# Patient Record
Sex: Female | Born: 1945 | Race: White | Hispanic: Yes | Smoking: Never smoker
Health system: Southern US, Community
[De-identification: ages and names within clinical notes are randomized; demographics above are authoritative.]

## PROBLEM LIST (undated history)

## (undated) DIAGNOSIS — T7840XA Allergy, unspecified, initial encounter: Secondary | ICD-10-CM

## (undated) DIAGNOSIS — I10 Essential (primary) hypertension: Secondary | ICD-10-CM

## (undated) DIAGNOSIS — E785 Hyperlipidemia, unspecified: Secondary | ICD-10-CM

## (undated) DIAGNOSIS — M1990 Unspecified osteoarthritis, unspecified site: Secondary | ICD-10-CM

## (undated) DIAGNOSIS — H269 Unspecified cataract: Secondary | ICD-10-CM

## (undated) DIAGNOSIS — Z5189 Encounter for other specified aftercare: Secondary | ICD-10-CM

## (undated) DIAGNOSIS — M810 Age-related osteoporosis without current pathological fracture: Secondary | ICD-10-CM

## (undated) DIAGNOSIS — G43909 Migraine, unspecified, not intractable, without status migrainosus: Secondary | ICD-10-CM

## (undated) DIAGNOSIS — G4730 Sleep apnea, unspecified: Secondary | ICD-10-CM

## (undated) DIAGNOSIS — J45909 Unspecified asthma, uncomplicated: Secondary | ICD-10-CM

## (undated) HISTORY — DX: Unspecified cataract: H26.9

## (undated) HISTORY — DX: Hyperlipidemia, unspecified: E78.5

## (undated) HISTORY — DX: Encounter for other specified aftercare: Z51.89

## (undated) HISTORY — PX: EYE SURGERY: SHX253

## (undated) HISTORY — PX: FOOT SURGERY: SHX648

## (undated) HISTORY — DX: Migraine, unspecified, not intractable, without status migrainosus: G43.909

## (undated) HISTORY — DX: Age-related osteoporosis without current pathological fracture: M81.0

## (undated) HISTORY — DX: Essential (primary) hypertension: I10

## (undated) HISTORY — PX: ABDOMINAL HYSTERECTOMY: SHX81

## (undated) HISTORY — DX: Unspecified osteoarthritis, unspecified site: M19.90

## (undated) HISTORY — DX: Sleep apnea, unspecified: G47.30

## (undated) HISTORY — DX: Allergy, unspecified, initial encounter: T78.40XA

## (undated) HISTORY — DX: Unspecified asthma, uncomplicated: J45.909

## (undated) HISTORY — PX: CHOLECYSTECTOMY: SHX55

---

## 2000-12-31 ENCOUNTER — Ambulatory Visit (HOSPITAL_COMMUNITY): Admission: RE | Admit: 2000-12-31 | Discharge: 2000-12-31 | Payer: Self-pay | Admitting: Family Medicine

## 2001-01-06 ENCOUNTER — Encounter: Payer: Self-pay | Admitting: Family Medicine

## 2001-01-06 ENCOUNTER — Encounter: Admission: RE | Admit: 2001-01-06 | Discharge: 2001-01-06 | Payer: Self-pay | Admitting: Family Medicine

## 2003-02-08 ENCOUNTER — Ambulatory Visit (HOSPITAL_COMMUNITY): Admission: RE | Admit: 2003-02-08 | Discharge: 2003-02-08 | Payer: Self-pay | Admitting: Family Medicine

## 2004-10-25 ENCOUNTER — Emergency Department (HOSPITAL_COMMUNITY): Admission: EM | Admit: 2004-10-25 | Discharge: 2004-10-25 | Payer: Self-pay | Admitting: Emergency Medicine

## 2008-03-15 ENCOUNTER — Encounter: Payer: Self-pay | Admitting: Internal Medicine

## 2008-08-28 ENCOUNTER — Encounter: Payer: Self-pay | Admitting: Internal Medicine

## 2008-08-29 ENCOUNTER — Encounter: Payer: Self-pay | Admitting: Internal Medicine

## 2008-09-18 ENCOUNTER — Ambulatory Visit: Payer: Self-pay | Admitting: Internal Medicine

## 2008-09-18 DIAGNOSIS — I10 Essential (primary) hypertension: Secondary | ICD-10-CM

## 2008-09-18 DIAGNOSIS — J4521 Mild intermittent asthma with (acute) exacerbation: Secondary | ICD-10-CM

## 2008-09-18 DIAGNOSIS — J309 Allergic rhinitis, unspecified: Secondary | ICD-10-CM | POA: Insufficient documentation

## 2008-09-18 DIAGNOSIS — R05 Cough: Secondary | ICD-10-CM

## 2008-09-18 DIAGNOSIS — R051 Acute cough: Secondary | ICD-10-CM | POA: Insufficient documentation

## 2008-09-19 ENCOUNTER — Telehealth (INDEPENDENT_AMBULATORY_CARE_PROVIDER_SITE_OTHER): Payer: Self-pay | Admitting: *Deleted

## 2008-11-16 ENCOUNTER — Telehealth: Payer: Self-pay | Admitting: Internal Medicine

## 2016-01-14 ENCOUNTER — Encounter: Payer: Self-pay | Admitting: Emergency Medicine

## 2016-03-29 ENCOUNTER — Ambulatory Visit (INDEPENDENT_AMBULATORY_CARE_PROVIDER_SITE_OTHER): Payer: Medicare (Managed Care)

## 2016-03-29 ENCOUNTER — Ambulatory Visit (INDEPENDENT_AMBULATORY_CARE_PROVIDER_SITE_OTHER): Payer: Medicare Other | Admitting: Urgent Care

## 2016-03-29 VITALS — BP 134/72 | HR 89 | Temp 98.5°F | Resp 18 | Ht 61.0 in | Wt 161.4 lb

## 2016-03-29 DIAGNOSIS — R05 Cough: Secondary | ICD-10-CM

## 2016-03-29 DIAGNOSIS — R0989 Other specified symptoms and signs involving the circulatory and respiratory systems: Secondary | ICD-10-CM

## 2016-03-29 DIAGNOSIS — R059 Cough, unspecified: Secondary | ICD-10-CM

## 2016-03-29 DIAGNOSIS — R0789 Other chest pain: Secondary | ICD-10-CM

## 2016-03-29 DIAGNOSIS — J4540 Moderate persistent asthma, uncomplicated: Secondary | ICD-10-CM | POA: Diagnosis not present

## 2016-03-29 MED ORDER — AZITHROMYCIN 250 MG PO TABS: ORAL_TABLET | ORAL | 0 refills | Status: DC

## 2016-03-29 MED ORDER — BENZONATATE 100 MG PO CAPS: 100.0000 mg | ORAL_CAPSULE | Freq: Three times a day (TID) | ORAL | 0 refills | Status: DC | PRN

## 2016-03-29 MED ORDER — HYDROCODONE-HOMATROPINE 5-1.5 MG/5ML PO SYRP: 5.0000 mL | ORAL_SOLUTION | Freq: Every evening | ORAL | 0 refills | Status: DC | PRN

## 2016-03-29 MED ORDER — PREDNISONE 20 MG PO TABS: 40.0000 mg | ORAL_TABLET | Freq: Every day | ORAL | 0 refills | Status: DC

## 2016-03-29 NOTE — Progress Notes (Addendum)
MRN: [id number] DOB: March 05, 1945  Subjective:   Dana Daniels is a 71 y.o. female presenting for chief complaint of Cough (X 3-4 days); Headache (X 3-4 days); and Fatigue (X 3-4 days)  Reports 4 day history of mildly productive cough, chest congestion. Cough elicits chest pain, has shob, wheezing. Has a history of asthma, used atrovent nebulizer this morning with good relief of her wheezing. Takes Singulair, Flovent, Spiriva daily for management of her asthma. Patient is on a beta blocker and her pulmonologist does not want her to use albuterol inhaler. Has also had headaches, history of migraines managed with Fioricet. Associated symptoms include nasal congestion, sore throat (now improved). Denies fever, sinus pain, eye pain, eye redness, ear pain, n/v, abdominal pain, rashes. Received her flu shot this past season. Has had her pneumonia vaccine updated this past season. Has a history of OSA, uses CPAP consistently. Denies smoking cigarettes or alcohol use.   Dana Daniels has a current medication list which includes the following prescription(s): amlodipine, butalbital-acetaminophen-caffeine, vitamin d3, diltiazem, fluticasone, fluticasone, furosemide, ipratropium, ipratropium, montelukast, nitroglycerin, omeprazole, simvastatin, and tiotropium. Also is allergic to tramadol.  Dana Daniels  has a past medical history of Allergy; Arthritis; Asthma; Blood transfusion without reported diagnosis; Cataract; Hypertension; and Osteoporosis. Also  has a past surgical history that includes Abdominal hysterectomy and Cholecystectomy.  Her family history includes Heart disease in her father and mother; Hyperlipidemia in her mother; Hypertension in her mother; Stroke in her father and mother.  Objective:   Vitals: BP 134/72   Pulse 89   Temp 98.5 F (36.9 C) (Oral)   Resp 18   Ht 5\' 1"  (1.549 m)   Wt 161 lb 6.4 oz (73.2 kg)   SpO2 98%   BMI 30.50 kg/m   Physical Exam  Constitutional: She is  oriented to person, place, and time. She appears well-developed and well-nourished.  HENT:  TM's intact bilaterally, no effusions or erythema. Nasal turbinates pink and moist, nasal passages patent. No sinus tenderness. Oropharynx clear, mucous membranes moist, dentition in good repair.  Eyes: Right eye exhibits no discharge. Left eye exhibits no discharge.  Neck: Normal range of motion. Neck supple.  Cardiovascular: Normal rate, regular rhythm and intact distal pulses.  Exam reveals no gallop and no friction rub.   No murmur heard. Pulmonary/Chest: No respiratory distress. She has no wheezes. She has no rales.  Coarse bibasilar lung sounds.  Lymphadenopathy:    She has no cervical adenopathy.  Neurological: She is alert and oriented to person, place, and time.  Skin: Skin is warm and dry.   Dg Chest 2 View  Result Date: 03/29/2016 CLINICAL DATA:  Cough for 3 days EXAM: CHEST  2 VIEW COMPARISON:  None. FINDINGS: Left base atelectasis. Right lung is clear. Heart is normal size. No effusions or acute bony abnormality. IMPRESSION: Left base atelectasis. Electronically Signed   By: Rolm Baptise M.D.   On: 03/29/2016 11:06   Assessment and Plan :   This case was precepted with Dr. Tamala Julian.   1. Cough 2. Chest congestion 3. Atypical chest pain 4. Moderate persistent extrinsic asthma without complication - Will start short steroid course given patient's strong history of asthma, x-ray findings. Patient would like to try this and fill antibiotic if she does not feel better by end of Sunday. In the meantime, she will also use cough suppression medications and schedule her nebulizer treatments. Patient is to f/u Thursday, 04/03/2016 if her cough persists.  Dana Eagles, PA-C Primary Care at [facility] [phone] 03/29/2016  10:31 AM

## 2016-03-29 NOTE — Patient Instructions (Addendum)
Si no ha mejorado con el steroide despues del domingo, tome el antibiotico (azithromycin).     Tos en los adultos (Cough, Adult) La tos es un reflejo que limpia la garganta y las vas respiratorias, y ayuda a la curacin y Health visitor de los pulmones. Es normal toser de Engineer, civil (consulting), pero cuando esta se presenta con otros sntomas o dura mucho tiempo puede ser el signo de una enfermedad que [place]. La tos puede durar solo 2 o 3semanas (aguda) o ms de 8semanas (crnica). CAUSAS Comnmente, las causas de la tos son las siguientes:  Advice worker sustancias que [employer]  Una infeccin respiratoria viral o bacteriana.  Alergias.  Asma.  Goteo posnasal.  Fumar.  El retroceso de cido estomacal hacia el esfago (reflujo gastroesofgico).  Algunos medicamentos.  Los problemas pulmonares crnicos, entre ellos, la enfermedad pulmonar obstructiva crnica (EPOC) (o, en contadas ocasiones, el cncer de pulmn).  Otras afecciones, como la insuficiencia cardaca. INSTRUCCIONES PARA EL CUIDADO EN EL HOGAR Est atento a cualquier cambio en los sntomas. Tome estas medidas para Public house manager las molestias:  Tome los medicamentos solamente como se lo haya indicado el mdico.  Si le recetaron un antibitico, tmelo como se lo haya indicado el mdico. No deje de tomar los antibiticos aunque comience a sentirse mejor.  Hable con el mdico antes de tomar un antitusivo.  Beba suficiente lquido para Consulting civil engineer orina clara o de color amarillo plido.  Si el aire est seco, use un vaporizador o un humidificador con vapor fro en su habitacin o en su casa para ayudar a aflojar las secreciones.  Evite todas las cosas que le producen tos en el trabajo o en su casa.  Si la tos aumenta durante la noche, intente dormir semisentado.  Evite el humo del cigarrillo. Si fuma, deje de hacerlo. Si necesita ayuda para dejar de fumar, consulte al mdico.  Evite la cafena.  Evite el  alcohol.  Descanse todo lo que sea necesario. SOLICITE ATENCIN MDICA SI:  Aparecen nuevos sntomas.  Expectora pus al toser.  La tos no mejora despus de 2 o 3semanas, o empeora.  No puede controlar la tos con antitusivos y no puede dormir bien.  Tiene un dolor que se intensifica o que no puede Research scientist (life sciences).  Tiene fiebre.  Baja de peso sin causa aparente.  Tiene transpiracin nocturna. SOLICITE ATENCIN MDICA DE INMEDIATO SI:  Tose y escupe sangre.  Tiene dificultad para respirar.  Los latidos cardacos son muy rpidos. Esta informacin no tiene Marine scientist el consejo del mdico. Asegrese de hacerle al mdico cualquier pregunta que tenga. Document Released: 09/25/2010 Document Revised: 11/08/2014 Document Reviewed: 04/26/2014 Elsevier Interactive Patient Education  2017 [employer].    IF you received an x-ray today, you will receive an invoice from [facility] Radiology. Please contact [facility] Radiology at [phone] with questions or concerns regarding your invoice.   IF you received labwork today, you will receive an invoice from [place]. Please contact [facility] at [phone] with questions or concerns regarding your invoice.   Our billing staff will not be able to assist you with questions regarding bills from these companies.  You will be contacted with the lab results as soon as they are available. The fastest way to get your results is to activate your My Chart account. Instructions are located on the last page of this paperwork. If you have not heard from [place] regarding the results in 2 weeks, please contact this office.

## 2016-03-30 LAB — CBC
HEMATOCRIT: 44.2 % (ref 34.0–46.6)
Hemoglobin: 14.4 g/dL (ref 11.1–15.9)
MCH: 27.2 pg (ref 26.6–33.0)
MCHC: 32.6 g/dL (ref 31.5–35.7)
MCV: 83 fL (ref 79–97)
Platelets: 358 10*3/uL (ref 150–379)
RBC: 5.3 x10E6/uL — ABNORMAL HIGH (ref 3.77–5.28)
RDW: 14.1 % (ref 12.3–15.4)
WBC: 10.4 10*3/uL (ref 3.4–10.8)

## 2016-03-31 ENCOUNTER — Encounter: Payer: Self-pay | Admitting: Urgent Care

## 2016-04-16 ENCOUNTER — Ambulatory Visit (INDEPENDENT_AMBULATORY_CARE_PROVIDER_SITE_OTHER): Payer: Medicare (Managed Care) | Admitting: Urgent Care

## 2016-04-16 VITALS — BP 124/62 | HR 90 | Temp 98.2°F | Resp 16 | Ht 61.0 in | Wt 162.0 lb

## 2016-04-16 DIAGNOSIS — J4540 Moderate persistent asthma, uncomplicated: Secondary | ICD-10-CM

## 2016-04-16 DIAGNOSIS — R059 Cough, unspecified: Secondary | ICD-10-CM

## 2016-04-16 DIAGNOSIS — R05 Cough: Secondary | ICD-10-CM

## 2016-04-16 DIAGNOSIS — Z9109 Other allergy status, other than to drugs and biological substances: Secondary | ICD-10-CM | POA: Diagnosis not present

## 2016-04-16 MED ORDER — FLUTICASONE PROPIONATE 50 MCG/ACT NA SUSP: 2.0000 | Freq: Every day | NASAL | 11 refills | Status: DC

## 2016-04-16 MED ORDER — PSEUDOEPHEDRINE HCL 60 MG PO TABS: 60.0000 mg | ORAL_TABLET | Freq: Three times a day (TID) | ORAL | 0 refills | Status: DC | PRN

## 2016-04-16 MED ORDER — CETIRIZINE HCL 10 MG PO TABS: 10.0000 mg | ORAL_TABLET | Freq: Every day | ORAL | 11 refills | Status: DC

## 2016-04-16 NOTE — Patient Instructions (Addendum)
Allergies, Adult An allergy is when your body's defense system (immune system) overreacts to an otherwise harmless substance (allergen) that you breathe in or eat or something that touches your skin. When you come into contact with something that you are allergic to, your immune system produces certain proteins (antibodies). These proteins cause cells to release chemicals (histamines) that trigger the symptoms of an allergic reaction. Allergies often affect the nasal passages (allergic rhinitis), eyes (allergic conjunctivitis), skin (atopic dermatitis), and stomach. Allergies can be mild or severe. Allergies cannot spread from person to person (are not contagious). They can develop at any age and may be outgrown. What are the causes? Allergies can be caused by any substance that your immune system mistakenly targets as harmful. These may include:  Outdoor allergens, such as pollen, grass, weeds, car exhaust, and mold spores.  Indoor allergens, such as dust, smoke, mold, and pet dander.  Foods, especially peanuts, milk, eggs, fish, shellfish, soy, nuts, and wheat.  Medicines, such as penicillin.  Skin irritants, such as detergents, chemicals, and latex.  Perfume.  Insect bites or stings. What increases the risk? You may be at greater risk of allergies if other people in your family have allergies. What are the signs or symptoms? Symptoms depend on what type of allergy you have. They may include:  Runny, stuffy nose.  Sneezing.  Itchy mouth, ears, or throat.  Postnasal drip.  Sore throat.  Itchy, red, watery, or puffy eyes.  Skin rash or hives.  Stomach pain.  Vomiting.  Diarrhea.  Bloating.  Wheezing or coughing. People with a severe allergy to food, medicine, or an insect bite may have a life-threatening allergic reaction (anaphylaxis). Symptoms of anaphylaxis include:  Hives.  Itching.  Flushed face.  Swollen lips, tongue, or mouth.  Tight or swollen  throat.  Chest pain or tightness in the chest.  Trouble breathing or shortness of breath.  Rapid heartbeat.  Dizziness or fainting.  Vomiting.  Diarrhea.  Pain in the abdomen. How is this diagnosed? This condition is diagnosed based on:  Your symptoms.  Your family and medical history.  A physical exam. You may need to see a health care provider who specializes in treating allergies (allergist). You may also have tests, including:  Skin tests to see which allergens are causing your symptoms, such as:  Skin prick test. In this test, your skin is pricked with a tiny needle and exposed to small amounts of possible allergens to see if your skin reacts.  Intradermal skin test. In this test, a small amount of allergen is injected under your skin to see if your skin reacts.  Patch test. In this test, a small amount of allergen is placed on your skin and then your skin is covered with a bandage. Your health care provider will check your skin after a couple of days to see if a rash has developed.  Blood tests.  Challenges tests. In this test, you inhale a small amount of allergen by mouth to see if you have an allergic reaction. You may also be asked to:  Keep a food diary. A food diary is a record of all the foods and drinks you have in a day and any symptoms you experience.  Practice an elimination diet. An elimination diet involves eliminating specific foods from your diet and then adding them back in one by one to find out if a certain food causes an allergic reaction. How is this treated? Treatment for allergies depends on your symptoms.   Treatment may include:  Cold compresses to soothe itching and swelling.  Eye drops.  Nasal sprays.  Using a saline spray or container (neti pot) to flush out the nose (nasal irrigation). These methods can help clear away mucus and keep the nasal passages moist.  Using a humidifier.  Oral antihistamines or other medicines to block  allergic reaction and inflammation.  Skin creams to treat rashes or itching.  Diet changes to eliminate food allergy triggers.  Repeated exposure to tiny amounts of allergens to build up a tolerance and prevent future allergic reactions (immunotherapy). These include:  Allergy shots.  Oral treatment. This involves taking small doses of an allergen under the tongue (sublingual immunotherapy).  Emergency epinephrine injection (auto-injector) in case of an allergic emergency. This is a self-injectable, pre-measured medicine that must be given within the first few minutes of a serious allergic reaction. Follow these instructions at home:  Avoid known allergens whenever possible.  If you suffer from airborne allergens, wash out your nose daily. You can do this with a saline spray or a neti pot to flush out your nose (nasal irrigation).  Take over-the-counter and prescription medicines only as told by your health care provider.  Keep all follow-up visits as told by your health care provider. This is important.  If you are at risk of a severe allergic reaction (anaphylaxis), keep your auto-injector with you at all times.  If you have ever had anaphylaxis, wear a medical alert bracelet or necklace that states you have a severe allergy. Contact a health care provider if:  Your symptoms do not improve with treatment. Get help right away if:  You have symptoms of anaphylaxis, such as:  Swollen mouth, tongue, or throat.  Pain or tightness in your chest.  Trouble breathing or shortness of breath.  Dizziness or fainting.  Severe abdominal pain, vomiting, or diarrhea. This information is not intended to replace advice given to you by your health care provider. Make sure you discuss any questions you have with your health care provider. Document Released: 05/13/2002 Document Revised: 10/18/2015 Document Reviewed: 09/05/2015 Elsevier Interactive Patient Education  2017 Elsevier  Inc.    Cough, Adult Coughing is a reflex that clears your throat and your airways. Coughing helps to heal and protect your lungs. It is normal to cough occasionally, but a cough that happens with other symptoms or lasts a long time may be a sign of a condition that needs treatment. A cough may last only 2-3 weeks (acute), or it may last longer than 8 weeks (chronic). What are the causes? Coughing is commonly caused by:  Breathing in substances that irritate your lungs.  A viral or bacterial respiratory infection.  Allergies.  Asthma.  Postnasal drip.  Smoking.  Acid backing up from the stomach into the esophagus (gastroesophageal reflux).  Certain medicines.  Chronic lung problems, including COPD (or rarely, lung cancer).  Other medical conditions such as heart failure. Follow these instructions at home: Pay attention to any changes in your symptoms. Take these actions to help with your discomfort:  Take medicines only as told by your health care provider.  If you were prescribed an antibiotic medicine, take it as told by your health care provider. Do not stop taking the antibiotic even if you start to feel better.  Talk with your health care provider before you take a cough suppressant medicine.  Drink enough fluid to keep your urine clear or pale yellow.  If the air is dry, use a cold  steam vaporizer or humidifier in your bedroom or your home to help loosen secretions.  Avoid anything that causes you to cough at work or at home.  If your cough is worse at night, try sleeping in a semi-upright position.  Avoid cigarette smoke. If you smoke, quit smoking. If you need help quitting, ask your health care provider.  Avoid caffeine.  Avoid alcohol.  Rest as needed. Contact a health care provider if:  You have new symptoms.  You cough up pus.  Your cough does not get better after 2-3 weeks, or your cough gets worse.  You cannot control your cough with  suppressant medicines and you are losing sleep.  You develop pain that is getting worse or pain that is not controlled with pain medicines.  You have a fever.  You have unexplained weight loss.  You have night sweats. Get help right away if:  You cough up blood.  You have difficulty breathing.  Your heartbeat is very fast. This information is not intended to replace advice given to you by your health care provider. Make sure you discuss any questions you have with your health care provider. Document Released: 08/16/2010 Document Revised: 07/26/2015 Document Reviewed: 04/26/2014 Elsevier Interactive Patient Education  2017 [employer].   IF you received an x-ray today, you will receive an invoice from [facility] Radiology. Please contact [facility] Radiology at [phone] with questions or concerns regarding your invoice.   IF you received labwork today, you will receive an invoice from [place]. Please contact [facility] at [phone] with questions or concerns regarding your invoice.   Our billing staff will not be able to assist you with questions regarding bills from these companies.  You will be contacted with the lab results as soon as they are available. The fastest way to get your results is to activate your My Chart account. Instructions are located on the last page of this paperwork. If you have not heard from [place] regarding the results in 2 weeks, please contact this office.

## 2016-04-16 NOTE — Progress Notes (Signed)
  MRN: [id number] DOB: 08-Mar-1945  Subjective:   Dana Daniels is a 71 y.o. female presenting for chief complaint of Cough (Non productive)  Reports ongoing cough, last seen for the same on 03/29/2016. She was treated with Azithromycin, short steroid course then. Notes significant improvement but has persistent dry cough. Denies fever, chest pain, shob, wheezing. She continues to use her inhalers, cough capsules, cough syrup. Denies smoking cigarettes.  Dana Daniels has a current medication list which includes the following prescription(s): amlodipine, butalbital-acetaminophen-caffeine, vitamin d3, diltiazem, fluticasone, fluticasone, furosemide, ipratropium, ipratropium, montelukast, nitroglycerin, omeprazole, prednisone, simvastatin, tiotropium, and hydrocodone-homatropine. Also is allergic to tramadol.  Dana Daniels  has a past medical history of Allergy; Arthritis; Asthma; Blood transfusion without reported diagnosis; Cataract; Hypertension; and Osteoporosis. Also  has a past surgical history that includes Abdominal hysterectomy and Cholecystectomy.  Objective:   Vitals: BP 124/62   Pulse 90   Temp 98.2 F (36.8 C) (Oral)   Resp 16   Ht 5\' 1"  (1.549 m)   Wt 162 lb (73.5 kg)   SpO2 98%   BMI 30.61 kg/m   Physical Exam  Constitutional: She is oriented to person, place, and time. She appears well-developed and well-nourished.  HENT:  Throat with thick post-nasal drainage.  Eyes: Right eye exhibits no discharge. Left eye exhibits no discharge.  Neck: Normal range of motion. Neck supple.  Cardiovascular: Normal rate, regular rhythm and intact distal pulses.  Exam reveals no gallop and no friction rub.   No murmur heard. Pulmonary/Chest: No respiratory distress. She has no wheezes. She has no rales.  Lymphadenopathy:    She has no cervical adenopathy.  Neurological: She is alert and oriented to person, place, and time.  Skin: Skin is warm and dry. No rash noted.   Assessment and  Plan :   1. Cough 2. Environmental allergies 3. Moderate persistent extrinsic asthma without complication - Restart Flonase, Zyrtec and use Sudafed as needed. Maintain asthma inhalers, Singulair. I do not suspect infectious etiology but recheck if chest pain, shob, fevers develop.  Dana Eagles, PA-C [facility] [phone] 04/16/2016  10:48 AM

## 2016-09-10 ENCOUNTER — Encounter: Payer: Self-pay | Admitting: Emergency Medicine

## 2016-09-15 ENCOUNTER — Encounter: Payer: Self-pay | Admitting: Emergency Medicine

## 2016-11-28 ENCOUNTER — Encounter: Payer: Self-pay | Admitting: Emergency Medicine

## 2016-12-30 ENCOUNTER — Encounter: Payer: Self-pay | Admitting: Emergency Medicine

## 2017-02-03 ENCOUNTER — Ambulatory Visit (INDEPENDENT_AMBULATORY_CARE_PROVIDER_SITE_OTHER): Payer: Medicare HMO | Admitting: Emergency Medicine

## 2017-02-03 ENCOUNTER — Encounter: Payer: Self-pay | Admitting: Emergency Medicine

## 2017-02-03 ENCOUNTER — Other Ambulatory Visit: Payer: Self-pay

## 2017-02-03 VITALS — BP 130/62 | HR 84 | Temp 98.6°F | Resp 16 | Ht 60.25 in | Wt 167.2 lb

## 2017-02-03 DIAGNOSIS — C44709 Unspecified malignant neoplasm of skin of left lower limb, including hip: Secondary | ICD-10-CM

## 2017-02-03 DIAGNOSIS — G43809 Other migraine, not intractable, without status migrainosus: Secondary | ICD-10-CM | POA: Diagnosis not present

## 2017-02-03 DIAGNOSIS — G4733 Obstructive sleep apnea (adult) (pediatric): Secondary | ICD-10-CM | POA: Insufficient documentation

## 2017-02-03 DIAGNOSIS — E785 Hyperlipidemia, unspecified: Secondary | ICD-10-CM | POA: Diagnosis not present

## 2017-02-03 DIAGNOSIS — J4520 Mild intermittent asthma, uncomplicated: Secondary | ICD-10-CM

## 2017-02-03 DIAGNOSIS — I10 Essential (primary) hypertension: Secondary | ICD-10-CM

## 2017-02-03 DIAGNOSIS — G4730 Sleep apnea, unspecified: Secondary | ICD-10-CM

## 2017-02-03 DIAGNOSIS — G43909 Migraine, unspecified, not intractable, without status migrainosus: Secondary | ICD-10-CM | POA: Insufficient documentation

## 2017-02-03 NOTE — Patient Instructions (Addendum)
  F/U with Dermatologist. F/u here in 4 weeks for CPE.   IF you received an x-ray today, you will receive an invoice from [facility] Radiology. Please contact [facility] Radiology at [phone] with questions or concerns regarding your invoice.   IF you received labwork today, you will receive an invoice from [place]. Please contact [facility] at [phone] with questions or concerns regarding your invoice.   Our billing staff will not be able to assist you with questions regarding bills from these companies.  You will be contacted with the lab results as soon as they are available. The fastest way to get your results is to activate your My Chart account. Instructions are located on the last page of this paperwork. If you have not heard from [place] regarding the results in 2 weeks, please contact this office.

## 2017-02-03 NOTE — Progress Notes (Signed)
Dana Daniels 71 y.o.   Chief Complaint  Patient presents with  . Foot Problem    per patient had left foot biospy for wart while [place] 12/2016     HISTORY OF PRESENT ILLNESS: This is a 71 y.o. 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   HPI   Prior to Admission medications   Medication Sig Start Date End Date Taking? Authorizing Provider  butalbital-acetaminophen-caffeine (ESGIC) 50-325-40 MG tablet Take by mouth 2 (two) times daily as needed for headache.   Yes [provider]  cetirizine (ZYRTEC) 10 MG tablet Take 1 tablet (10 mg total) by mouth daily. 04/16/16  Yes Jaynee Eagles, PA-C  Cholecalciferol (VITAMIN D3) 10000 units TABS Take by mouth.   Yes [provider]  diltiazem (CARDIZEM) 120 MG tablet Take 120 mg by mouth daily.   Yes [provider]  fluticasone (FLONASE) 50 MCG/ACT nasal spray Place 2 sprays into both nostrils daily. 04/16/16  Yes Jaynee Eagles, PA-C  ipratropium (ATROVENT HFA) 17 MCG/ACT inhaler Inhale 2 puffs into the lungs every 6 (six) hours.   Yes [provider]  ipratropium (ATROVENT) 0.02 % nebulizer solution Take 0.5 mg by nebulization 4 (four) times daily.   Yes [provider]  montelukast (SINGULAIR) 10 MG tablet Take 10 mg by mouth at bedtime.   Yes [provider]  nitroGLYCERIN (NITROSTAT) 0.4 MG SL tablet Place 0.4 mg under the tongue every 5 (five) minutes as needed for chest pain.   Yes [provider]  omeprazole (PRILOSEC) 20 MG capsule Take 20 mg by mouth 2 (two) times daily before a meal.   Yes [provider]  pseudoephedrine (SUDAFED) 60 MG tablet Take 1 tablet (60 mg total) by mouth every 8 (eight) hours as needed. 04/16/16  Yes Jaynee Eagles, PA-C  simvastatin (ZOCOR) 20 MG tablet Take 20 mg by mouth daily.   Yes [provider]  tiotropium (SPIRIVA) 18 MCG inhalation capsule Place 18 mcg  into inhaler and inhale daily.   Yes [provider]  amLODipine (NORVASC) 5 MG tablet Take 5 mg by mouth 2 (two) times daily.    [provider]  fluticasone (FLOVENT DISKUS) 50 MCG/BLIST diskus inhaler Inhale 1 puff into the lungs 2 (two) times daily.    [provider]  furosemide (LASIX) 20 MG tablet Take 20 mg by mouth.    [provider]  HYDROcodone-homatropine (HYCODAN) 5-1.5 MG/5ML syrup Take 5 mLs by mouth at bedtime as needed. Patient not taking: Reported on 04/16/2016 03/29/16   Jaynee Eagles, PA-C  predniSONE (DELTASONE) 20 MG tablet Take 2 tablets (40 mg total) by mouth daily with breakfast. Patient not taking: Reported on 02/03/2017 03/29/16   Jaynee Eagles, PA-C    Allergies  Allergen Reactions  . Tramadol Itching    Patient Active Problem List   Diagnosis Date Noted  . HYPERTENSION 09/18/2008  . ALLERGIC RHINITIS 09/18/2008  . ASTHMA 09/18/2008  . COUGH 09/18/2008    Past Medical History:  Diagnosis Date  . Allergy   . Arthritis   . Asthma   . Blood transfusion without reported diagnosis   . Cataract   . Hypertension   . Osteoporosis       Social History   Socioeconomic History  . Marital status: Widowed    Spouse name: Not on file  . Number of children: Not on file  . Years of education: Not on file  .  Highest education level: Not on file  Social Needs  . Financial resource strain: Not on file  . Food insecurity - worry: Not on file  . Food insecurity - inability: Not on file  . Transportation needs - medical: Not on file  . Transportation needs - non-medical: Not on file  Occupational History  . Not on file  Tobacco Use  . Smoking status: Never Smoker  . Smokeless tobacco: Never Used  Substance and Sexual Activity  . Alcohol use: No  . Drug use: Not on file  . Sexual activity: Not on file  Other Topics Concern  . Not on file  Social History Narrative  . Not on file    Family History  Problem Relation Age  of Onset  . Heart disease Mother   . Hyperlipidemia Mother   . Hypertension Mother   . Stroke Mother   . Heart disease Father   . Stroke Father      Review of Systems  Constitutional: Negative.  Negative for chills, fever and weight loss.  HENT: Negative.  Negative for congestion, nosebleeds and sore throat.   Eyes: Negative.  Negative for blurred vision and double vision.  Respiratory: Positive for shortness of breath (DOE). Negative for cough.   Cardiovascular: Positive for chest pain (recent cardiac cath showed no obstructions). Negative for leg swelling.  Gastrointestinal: Negative for abdominal pain, diarrhea, nausea and vomiting.  Genitourinary: Negative for dysuria and hematuria.  Musculoskeletal: Negative for back pain, myalgias and neck pain.  Skin:       Skin CA left foot.  Neurological: Positive for headaches (chronic). Negative for dizziness.  Endo/Heme/Allergies: Negative.   All other systems reviewed and are negative.  Vitals:   02/03/17 1629  BP: 130/62  Pulse: 84  Resp: 16  Temp: 98.6 F (37 C)  SpO2: 96%     Physical Exam  Constitutional: She is oriented to person, place, and time. She appears well-developed and well-nourished.  HENT:  Head: Normocephalic and atraumatic.  Nose: Nose normal.  Mouth/Throat: Oropharynx is clear and moist.  Eyes: Conjunctivae and EOM are normal. Pupils are equal, round, and reactive to light.  Neck: Normal range of motion. No JVD present. No thyromegaly present.  Cardiovascular: Normal rate, regular rhythm and normal heart sounds.  Pulmonary/Chest: Effort normal and breath sounds normal.  Abdominal: Soft. Bowel sounds are normal. She exhibits no distension. There is no tenderness.  Musculoskeletal: Normal range of motion.  Neurological: She is alert and oriented to person, place, and time. No sensory deficit. She exhibits normal muscle tone.  Skin: Skin is warm and dry. Capillary refill takes less than 2 seconds. No rash  noted.  Psychiatric: She has a normal mood and affect. Her behavior is normal.  Vitals reviewed.    ASSESSMENT & PLAN: Dana Daniels was seen today for foot problem.  Diagnoses and all orders for this visit:  Primary cancer of skin of left foot -     Ambulatory referral to Dermatology  Essential hypertension  Mild intermittent asthma without complication  Sleep apnea, unspecified type  Dyslipidemia  Other migraine without status migrainosus, not intractable   Patient Instructions    F/U with Dermatologist. F/u here in 4 weeks for CPE.   IF you received an x-ray today, you will receive an invoice from [facility]. Please contact [facility] at [phone] with questions or concerns regarding your invoice.   IF you received labwork today, you will receive an invoice from [facility]. Please contact [facility] at  [phone] with questions or concerns regarding your invoice.   Our billing staff will not be able to assist you with questions regarding bills from these companies.  You will be contacted with the lab results as soon as they are available. The fastest way to get your results is to activate your My Chart account. Instructions are located on the last page of this paperwork. If you have not heard from [place] regarding the results in 2 weeks, please contact this office.         Agustina Caroli, MD [facility]

## 2017-02-11 ENCOUNTER — Telehealth: Payer: Self-pay | Admitting: Emergency Medicine

## 2017-02-11 NOTE — Telephone Encounter (Signed)
Thanks

## 2017-02-11 NOTE — Telephone Encounter (Signed)
Tried returning call from pt's daughter-in-law to let her know we sent pt's referral for dermatology to [facility].. Tried calling pt, pt's daughter-in-law and pt's son to let them know but it stated the phone numbers were not valid tried several times..  Would have called pt's emergency contact but he is not on pt's DPR.Marland Kitchen

## 2017-02-13 NOTE — Telephone Encounter (Signed)
Tired calling pt daughter-in-law again on number that was left in voicemail and the same thing happened it stated that the number was not a valid number.. Pt has an appt scheduled at [facility] for dermatology their number is [phone] if pt or pt's daughter-in-law calls back please give her this information so she can call and get her appt information from them.. Thank You.

## 2017-02-17 DIAGNOSIS — L988 Other specified disorders of the skin and subcutaneous tissue: Secondary | ICD-10-CM | POA: Diagnosis not present

## 2017-02-17 DIAGNOSIS — B070 Plantar wart: Secondary | ICD-10-CM | POA: Diagnosis not present

## 2017-02-17 DIAGNOSIS — C4922 Malignant neoplasm of connective and soft tissue of left lower limb, including hip: Secondary | ICD-10-CM | POA: Diagnosis not present

## 2017-02-27 DIAGNOSIS — B070 Plantar wart: Secondary | ICD-10-CM | POA: Diagnosis not present

## 2017-03-05 DIAGNOSIS — B070 Plantar wart: Secondary | ICD-10-CM | POA: Diagnosis not present

## 2017-03-05 DIAGNOSIS — C4922 Malignant neoplasm of connective and soft tissue of left lower limb, including hip: Secondary | ICD-10-CM | POA: Diagnosis not present

## 2017-03-11 ENCOUNTER — Other Ambulatory Visit: Payer: Self-pay | Admitting: Dermatology

## 2017-03-11 DIAGNOSIS — R5381 Other malaise: Secondary | ICD-10-CM

## 2017-03-11 DIAGNOSIS — C4922 Malignant neoplasm of connective and soft tissue of left lower limb, including hip: Secondary | ICD-10-CM

## 2017-03-13 ENCOUNTER — Other Ambulatory Visit: Payer: Self-pay | Admitting: Dermatology

## 2017-03-13 ENCOUNTER — Ambulatory Visit
Admission: RE | Admit: 2017-03-13 | Discharge: 2017-03-13 | Disposition: A | Discharge status: Home / Self Care | Payer: Medicare HMO | Source: Ambulatory Visit | Attending: Dermatology | Admitting: Dermatology

## 2017-03-13 DIAGNOSIS — M19072 Primary osteoarthritis, left ankle and foot: Secondary | ICD-10-CM | POA: Diagnosis not present

## 2017-03-13 DIAGNOSIS — C4922 Malignant neoplasm of connective and soft tissue of left lower limb, including hip: Secondary | ICD-10-CM

## 2017-03-13 MED ORDER — GADOBENATE DIMEGLUMINE 529 MG/ML IV SOLN
15.0000 mL | Freq: Once | INTRAVENOUS | Status: AC | PRN
  Administered 2017-03-13: 15 mL via INTRAVENOUS

## 2017-03-24 ENCOUNTER — Other Ambulatory Visit: Payer: Self-pay

## 2017-03-24 ENCOUNTER — Ambulatory Visit (INDEPENDENT_AMBULATORY_CARE_PROVIDER_SITE_OTHER): Payer: Medicare HMO | Admitting: Emergency Medicine

## 2017-03-24 ENCOUNTER — Encounter: Payer: Self-pay | Admitting: Emergency Medicine

## 2017-03-24 ENCOUNTER — Ambulatory Visit (INDEPENDENT_AMBULATORY_CARE_PROVIDER_SITE_OTHER): Payer: Medicare HMO

## 2017-03-24 VITALS — BP 128/74 | HR 73 | Temp 98.8°F | Resp 16 | Ht 60.5 in | Wt 166.0 lb

## 2017-03-24 DIAGNOSIS — J449 Chronic obstructive pulmonary disease, unspecified: Secondary | ICD-10-CM | POA: Diagnosis not present

## 2017-03-24 DIAGNOSIS — M25562 Pain in left knee: Secondary | ICD-10-CM

## 2017-03-24 DIAGNOSIS — E785 Hyperlipidemia, unspecified: Secondary | ICD-10-CM

## 2017-03-24 DIAGNOSIS — G8929 Other chronic pain: Secondary | ICD-10-CM | POA: Insufficient documentation

## 2017-03-24 DIAGNOSIS — M159 Polyosteoarthritis, unspecified: Secondary | ICD-10-CM | POA: Insufficient documentation

## 2017-03-24 DIAGNOSIS — Z1159 Encounter for screening for other viral diseases: Secondary | ICD-10-CM

## 2017-03-24 DIAGNOSIS — I10 Essential (primary) hypertension: Secondary | ICD-10-CM

## 2017-03-24 MED ORDER — CHOLECALCIFEROL 250 MCG (10000 UT) PO TABS: 1.0000 | ORAL_TABLET | Freq: Every day | ORAL | 1 refills | Status: AC

## 2017-03-24 MED ORDER — OMEPRAZOLE 20 MG PO CPDR: 20.0000 mg | DELAYED_RELEASE_CAPSULE | Freq: Two times a day (BID) | ORAL | 2 refills | Status: DC

## 2017-03-24 MED ORDER — SIMVASTATIN 20 MG PO TABS: 20.0000 mg | ORAL_TABLET | Freq: Every day | ORAL | 1 refills | Status: DC

## 2017-03-24 MED ORDER — MONTELUKAST SODIUM 10 MG PO TABS: 10.0000 mg | ORAL_TABLET | Freq: Every day | ORAL | 1 refills | Status: DC

## 2017-03-24 MED ORDER — DILTIAZEM HCL ER COATED BEADS 240 MG PO CP24: 240.0000 mg | ORAL_CAPSULE | Freq: Every day | ORAL | 1 refills | Status: DC

## 2017-03-24 NOTE — Progress Notes (Signed)
Dana Daniels 72 y.o.   Chief Complaint  Patient presents with  . Skin Cancer    follow up - LEFT foot  . Knee Pain    BOTH and HIPS  FOR YEARS    HISTORY OF PRESENT ILLNESS: [name] complaining of pain to knees and hips L>R for years. Left foot skin cancer seen by Dermatologist; under control; had recent MRI without sign of mass/CA. Has h/o COPD and sleep apnea, uses CPAP machine.  HPI   Prior to Admission medications   Medication Sig Start Date End Date Taking? Authorizing Provider  butalbital-acetaminophen-caffeine (ESGIC) 50-325-40 MG tablet Take by mouth 2 (two) times daily as needed for headache.   Yes [provider]  cetirizine (ZYRTEC) 10 MG tablet Take 1 tablet (10 mg total) by mouth daily. 04/16/16  Yes Jaynee Eagles, PA-C  Cholecalciferol (VITAMIN D3) 10000 units TABS Take by mouth.   Yes [provider]  diltiazem (CARDIZEM) 120 MG tablet Take 120 mg by mouth daily.   Yes [provider]  HYDROcodone-homatropine (HYCODAN) 5-1.5 MG/5ML syrup Take 5 mLs by mouth at bedtime as needed. 03/29/16  Yes Jaynee Eagles, PA-C  ipratropium (ATROVENT HFA) 17 MCG/ACT inhaler Inhale 2 puffs into the lungs every 6 (six) hours.   Yes [provider]  ipratropium (ATROVENT) 0.02 % nebulizer solution Take 0.5 mg by nebulization 4 (four) times daily.   Yes [provider]  montelukast (SINGULAIR) 10 MG tablet Take 10 mg by mouth at bedtime.   Yes [provider]  nitroGLYCERIN (NITROSTAT) 0.4 MG SL tablet Place 0.4 mg under the tongue every 5 (five) minutes as needed for chest pain.   Yes [provider]  omeprazole (PRILOSEC) 20 MG capsule Take 20 mg by mouth 2 (two) times daily before a meal.   Yes [provider]  simvastatin (ZOCOR) 20 MG tablet Take 20 mg by mouth daily.   Yes [provider]  tiotropium (SPIRIVA) 18 MCG inhalation capsule Place 18 mcg into inhaler and inhale daily.    Yes [provider]  amLODipine (NORVASC) 5 MG tablet Take 5 mg by mouth 2 (two) times daily.    [provider]  fluticasone (FLONASE) 50 MCG/ACT nasal spray Place 2 sprays into both nostrils daily. 04/16/16   Jaynee Eagles, PA-C  fluticasone (FLOVENT DISKUS) 50 MCG/BLIST diskus inhaler Inhale 1 puff into the lungs 2 (two) times daily.    [provider]  furosemide (LASIX) 20 MG tablet Take 20 mg by mouth.    [provider]  predniSONE (DELTASONE) 20 MG tablet Take 2 tablets (40 mg total) by mouth daily with breakfast. Patient not taking: Reported on 02/03/2017 03/29/16   Jaynee Eagles, PA-C  pseudoephedrine (SUDAFED) 60 MG tablet Take 1 tablet (60 mg total) by mouth every 8 (eight) hours as needed. Patient not taking: Reported on 03/24/2017 04/16/16   Jaynee Eagles, PA-C    Allergies  Allergen Reactions  . Tramadol Itching    Patient Active Problem List   Diagnosis Date Noted  . Primary cancer of skin of left foot 02/03/2017  . Mild intermittent asthma without complication 20/25/4270  . Sleep apnea 02/03/2017  . Dyslipidemia 02/03/2017  . Migraine 02/03/2017  . Essential hypertension 09/18/2008  . ALLERGIC RHINITIS 09/18/2008  . ASTHMA 09/18/2008  . COUGH 09/18/2008    Past Medical History:  Diagnosis Date  . Allergy   . Arthritis   . Asthma   . Blood transfusion without  reported diagnosis   . Cataract   . Hyperlipidemia   . Hypertension   . Migraine   . Osteoporosis   . Sleep apnea     Past Surgical History:  Procedure Laterality Date  . ABDOMINAL HYSTERECTOMY    . CHOLECYSTECTOMY      Social History   Socioeconomic History  . Marital status: Widowed    Spouse name: Not on file  . Number of children: Not on file  . Years of education: Not on file  . Highest education level: Not on file  Social Needs  . Financial resource strain: Not on file  . Food insecurity - worry: Not on file  . Food insecurity - inability: Not on file  .  Transportation needs - medical: Not on file  . Transportation needs - non-medical: Not on file  Occupational History  . Not on file  Tobacco Use  . Smoking status: Never Smoker  . Smokeless tobacco: Never Used  Substance and Sexual Activity  . Alcohol use: No  . Drug use: Not on file  . Sexual activity: Not on file  Other Topics Concern  . Not on file  Social History Narrative  . Not on file    Family History  Problem Relation Age of Onset  . Heart disease Mother   . Hyperlipidemia Mother   . Hypertension Mother   . Stroke Mother   . Heart disease Father   . Stroke Father      Review of Systems  Constitutional: Negative.  Negative for chills, fever and weight loss.  HENT: Negative.  Negative for congestion, nosebleeds and sore throat.   Eyes: Negative.  Negative for blurred vision and double vision.  Respiratory: Positive for shortness of breath and wheezing. Negative for cough.   Cardiovascular: Negative.  Negative for chest pain and palpitations.  Gastrointestinal: Negative.  Negative for abdominal pain, diarrhea, nausea and vomiting.  Genitourinary: Negative.  Negative for dysuria and hematuria.  Musculoskeletal: Positive for back pain and joint pain.  Skin: Negative.  Negative for rash.  Neurological: Negative.  Negative for dizziness, sensory change, focal weakness and headaches.  Endo/Heme/Allergies: Negative.   All other systems reviewed and are negative.   Vitals:   03/24/17 1434  BP: 128/74  Pulse: 73  Resp: 16  Temp: 98.8 F (37.1 C)  SpO2: 98%    Physical Exam  Constitutional: She is oriented to person, place, and time. She appears well-developed and well-nourished.  HENT:  Head: Normocephalic and atraumatic.  Mouth/Throat: Oropharynx is clear and moist.  Eyes: Conjunctivae and EOM are normal. Pupils are equal, round, and reactive to light.  Neck: Normal range of motion. Neck supple.  Cardiovascular: Normal rate, regular rhythm and normal heart  sounds.  Pulmonary/Chest: Effort normal and breath sounds normal.  Abdominal: Soft. Bowel sounds are normal. She exhibits no distension. There is no tenderness.  Musculoskeletal:  +[place]berden and Bouchard nodes in fingers Knees: no erythema or swelling but pain during ROM  Neurological: She is alert and oriented to person, place, and time. No sensory deficit. She exhibits normal muscle tone.  Skin: Skin is warm and dry. Capillary refill takes less than 2 seconds. No rash noted.  Psychiatric: She has a normal mood and affect. Her behavior is normal.  Vitals reviewed.   Dg Knee Complete 4 Views Left  Result Date: 03/24/2017 CLINICAL DATA:  Chronic pain EXAM: LEFT KNEE - COMPLETE 4+ VIEW COMPARISON:  None. FINDINGS: Frontal, tunnel, lateral and sunrise patellar images were  obtained. There is no fracture or dislocation. No joint effusion. Joint spaces appear normal. No erosive change. There is a spur arising from the anterior aspect of the patella superiorly. 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 No appreciable joint space narrowing. No fracture or joint effusion. Electronically Signed   By: Lowella Grip III M.D.   On: 03/24/2017 15:41   A total of 25 minutes was spent in the room with the patient, greater than 50% of which was in counseling/coordination of care.   ASSESSMENT & PLAN: [place] was seen today for skin cancer and knee pain.  Diagnoses and all orders for this visit:  Osteoarthritis of multiple joints, unspecified osteoarthritis type -     CBC with Differential/Platelet -     Ambulatory referral to Rheumatology -     Cholecalciferol 10000 units TABS; Take 1 tablet by mouth daily.  Chronic pain of left knee -     DG Knee Complete 4 Views Left; Future  Chronic obstructive pulmonary disease, unspecified COPD type ([place]) -     Comprehensive metabolic panel -     Ambulatory referral to  Pulmonology  Need for hepatitis C screening test -     Hepatitis C antibody  Dyslipidemia -     simvastatin (ZOCOR) 20 MG tablet; Take 1 tablet (20 mg total) by mouth daily.  Essential hypertension -     diltiazem (CARDIZEM CD) 240 MG 24 hr capsule; Take 1 capsule (240 mg total) by mouth daily.  Other orders -     omeprazole (PRILOSEC) 20 MG capsule; Take 1 capsule (20 mg total) by mouth 2 (two) times daily before a meal. -     montelukast (SINGULAIR) 10 MG tablet; Take 1 tablet (10 mg total) by mouth at bedtime.    Patient Instructions     Arthritis Arthritis means joint pain. It can also mean joint disease. A joint is a place where bones come together. People who have arthritis may have:  Red joints.  Swollen joints.  Stiff joints.  Warm joints.  A fever.  A feeling of being sick.  Follow these instructions at home: Pay attention to any changes in your symptoms. Take these actions to help with your pain and swelling. Medicines  Take over-the-counter and prescription medicines only as told by your doctor.  Do not take aspirin for pain if your doctor says that you may have gout. Activity  Rest your joint if your doctor tells you to.  Avoid activities that make the pain worse.  Exercise your joint regularly as told by your doctor. Try doing exercises like: ? Swimming. ? Water aerobics. ? Biking. ? Walking. Joint Care   If your joint is swollen, keep it raised (elevated) if told by your doctor.  If your joint feels stiff in the morning, try taking a warm shower.  If you have diabetes, do not apply heat without asking your doctor.  If told, apply heat to the joint: ? Put a towel between the joint and the hot pack or heating pad. ? Leave the heat on the area for 20-30 minutes.  If told, apply ice to the joint: ? Put ice in a plastic bag. ? Place a towel between your skin and the bag. ? Leave the ice on for 20 minutes, 2-3 times per day.  Keep all  follow-up visits as told by your doctor. Contact a doctor if:  The pain gets worse.  You have a fever. Get help right away if:  You have very bad pain in your joint.  You have swelling in your joint.  Your joint is red.  Many joints become painful and swollen.  You have very bad back pain.  Your leg is very weak.  You cannot control your pee (urine) or poop (stool). This information is not intended to replace advice given to you by your health care provider. Make sure you discuss any questions you have with your health care provider. Document Released: 05/14/2009 Document Revised: 07/26/2015 Document Reviewed: 05/15/2014 Elsevier Interactive Patient Education  2018 [place] (Arthritis) El significado del trmino artritis es dolor de las articulaciones. Tambin puede significar enfermedad articular. Una articulacin es TEFL teacher de unin de [place]. Las personas que sufren artritis pueden tener lo siguiente:  Enrojecimiento de las articulaciones.  Inflamaciones articulares.  Articulaciones rgidas.  Articulaciones calientes.  Cristy Hilts.  Sensacin de estar enfermo. CUIDADOS EN EL HOGAR Est atento a cualquier cambio en los sntomas. Tome estas medidas para Best boy y la hinchazn. Medicamentos  [employer] de venta libre y los recetados solamente como se lo haya indicado el mdico.  No tome aspirina para Best boy si el mdico le dice que puede tener gota. Actividades  Ponga la articulacin en reposo si el mdico se lo indica.  Evite las actividades que intensifiquen Conservation officer, historic buildings.  Haga ejercicios con la articulacin como se lo haya indicado el mdico. Intente hacer ejercicios tales como: ? Natacin. ? Gimnasia acutica. ? Andar en bicicleta. ? Caminar. Cuidado de la articulacin  Si la articulacin est hinchada, mantngala elevada si el mdico se lo indic.  Si al despertar por la maana, nota que la articulacin est  rgida, intente tomar una ducha con agua tibia.  Si es diabtico, no se ponga calor sin consultarle al mdico.  Si se lo indican, pngase calor en la articulacin: ? Coloque una toalla entre la articulacin y la compresa caliente o la almohadilla trmica. ? Coloque el calor en la zona durante 20 o 44minutos.  Si se lo indican, pngase hielo en la articulacin: ? Ponga el hielo en una bolsa plstica. ? Coloque una [employer] piel y la bolsa de hielo. ? Coloque el hielo durante 60minutos, 2 a 3veces por da.  Concurra a todas las visitas de control como se lo haya indicado el mdico. SOLICITE AYUDA SI:  El dolor empeora.  Tiene fiebre. SOLICITE AYUDA DE INMEDIATO SI:  Siente un dolor muy intenso en la articulacin.  Se le hincha la articulacin.  La articulacin est enrojecida.  Siente dolor en muchas articulaciones y se le hinchan.  Siente un dolor muy intenso en la espalda.  Tiene la pierna muy dbil.  No puede controlar la miccin (orina) o la defecacin (materia fecal). Esta informacin no tiene Marine scientist el consejo del mdico. Asegrese de hacerle al mdico cualquier pregunta que tenga. Document Released: 08/19/2011 Document Revised: 06/11/2015 Document Reviewed: 05/15/2014 Elsevier Interactive Patient Education  2018 [employer].    IF you received an x-ray today, you will receive an invoice from [facility]. Please contact [facility] at [phone] with questions or concerns regarding your invoice.   IF you received labwork today, you will receive an invoice from [facility]. Please contact [facility] at [phone] with questions or concerns regarding your invoice.   Our billing staff will not be able to assist you with questions regarding bills from these companies.  You will be contacted with the lab results as soon as they are available. The fastest way to get your results is to activate your My Chart account. Instructions  are located on the last page of this paperwork. If you have not heard from [place] regarding the results in 2 weeks, please contact this office.         Agustina Caroli, MD [facility]

## 2017-03-24 NOTE — Patient Instructions (Addendum)
Arthritis Arthritis means joint pain. It can also mean joint disease. A joint is a place where bones come together. People who have arthritis may have:  Red joints.  Swollen joints.  Stiff joints.  Warm joints.  A fever.  A feeling of being sick.  Follow these instructions at home: Pay attention to any changes in your symptoms. Take these actions to help with your pain and swelling. Medicines  Take over-the-counter and prescription medicines only as told by your doctor.  Do not take aspirin for pain if your doctor says that you may have gout. Activity  Rest your joint if your doctor tells you to.  Avoid activities that make the pain worse.  Exercise your joint regularly as told by your doctor. Try doing exercises like: ? Swimming. ? Water aerobics. ? Biking. ? Walking. Joint Care   If your joint is swollen, keep it raised (elevated) if told by your doctor.  If your joint feels stiff in the morning, try taking a warm shower.  If you have diabetes, do not apply heat without asking your doctor.  If told, apply heat to the joint: ? Put a towel between the joint and the hot pack or heating pad. ? Leave the heat on the area for 20-30 minutes.  If told, apply ice to the joint: ? Put ice in a plastic bag. ? Place a towel between your skin and the bag. ? Leave the ice on for 20 minutes, 2-3 times per day.  Keep all follow-up visits as told by your doctor. Contact a doctor if:  The pain gets worse.  You have a fever. Get help right away if:  You have very bad pain in your joint.  You have swelling in your joint.  Your joint is red.  Many joints become painful and swollen.  You have very bad back pain.  Your leg is very weak.  You cannot control your pee (urine) or poop (stool). This information is not intended to replace advice given to you by your health care provider. Make sure you discuss any questions you have with your health care  provider. Document Released: 05/14/2009 Document Revised: 07/26/2015 Document Reviewed: 05/15/2014 Elsevier Interactive Patient Education  2018 [place] (Arthritis) El significado del trmino artritis es dolor de las articulaciones. Tambin puede significar enfermedad articular. Una articulacin es TEFL teacher de unin de [place]. Las personas que sufren artritis pueden tener lo siguiente:  Enrojecimiento de las articulaciones.  Inflamaciones articulares.  Articulaciones rgidas.  Articulaciones calientes.  Cristy Hilts.  Sensacin de estar enfermo. CUIDADOS EN EL HOGAR Est atento a cualquier cambio en los sntomas. Tome estas medidas para Best boy y la hinchazn. Medicamentos  [employer] de venta libre y los recetados solamente como se lo haya indicado el mdico.  No tome aspirina para Best boy si el mdico le dice que puede tener gota. Actividades  Ponga la articulacin en reposo si el mdico se lo indica.  Evite las actividades que intensifiquen Conservation officer, historic buildings.  Haga ejercicios con la articulacin como se lo haya indicado el mdico. Intente hacer ejercicios tales como: ? Natacin. ? Gimnasia acutica. ? Andar en bicicleta. ? Caminar. Cuidado de la articulacin  Si la articulacin est hinchada, mantngala elevada si el mdico se lo indic.  Si al despertar por la maana, nota que la articulacin est rgida, intente tomar una ducha con agua tibia.  Si es diabtico, no se ponga calor sin consultarle al mdico.  Si se lo indican, pngase calor en la articulacin: ? Coloque una toalla entre la articulacin y la compresa caliente o la almohadilla trmica. ? Coloque el calor en la zona durante 20 o 27minutos.  Si se lo indican, pngase hielo en la articulacin: ? Ponga el hielo en una bolsa plstica. ? Coloque una [employer] piel y la bolsa de hielo. ? Coloque el hielo durante 58minutos, 2 a 3veces por da.  Concurra a todas  las visitas de control como se lo haya indicado el mdico. SOLICITE AYUDA SI:  El dolor empeora.  Tiene fiebre. SOLICITE AYUDA DE INMEDIATO SI:  Siente un dolor muy intenso en la articulacin.  Se le hincha la articulacin.  La articulacin est enrojecida.  Siente dolor en muchas articulaciones y se le hinchan.  Siente un dolor muy intenso en la espalda.  Tiene la pierna muy dbil.  No puede controlar la miccin (orina) o la defecacin (materia fecal). Esta informacin no tiene Marine scientist el consejo del mdico. Asegrese de hacerle al mdico cualquier pregunta que tenga. Document Released: 08/19/2011 Document Revised: 06/11/2015 Document Reviewed: 05/15/2014 Elsevier Interactive Patient Education  2018 [employer].    IF you received an x-ray today, you will receive an invoice from [facility]. Please contact [facility] at [phone] with questions or concerns regarding your invoice.   IF you received labwork today, you will receive an invoice from [facility]. Please contact [facility] at [phone] with questions or concerns regarding your invoice.   Our billing staff will not be able to assist you with questions regarding bills from these companies.  You will be contacted with the lab results as soon as they are available. The fastest way to get your results is to activate your My Chart account. Instructions are located on the last page of this paperwork. If you have not heard from [place] regarding the results in 2 weeks, please contact this office.

## 2017-03-25 LAB — COMPREHENSIVE METABOLIC PANEL
ALK PHOS: 99 IU/L (ref 39–117)
ALT: 23 IU/L (ref 0–32)
AST: 20 IU/L (ref 0–40)
Albumin/Globulin Ratio: 1.8 (ref 1.2–2.2)
Albumin: 4.5 g/dL (ref 3.6–4.8)
BUN/Creatinine Ratio: 15 (ref 12–28)
BUN: 9 mg/dL (ref 8–27)
Bilirubin Total: 0.2 mg/dL (ref 0.0–1.2)
CO2: 22 mmol/L (ref 20–29)
Calcium: 9.5 mg/dL (ref 8.7–10.3)
Chloride: 104 mmol/L (ref 96–106)
Creatinine, Ser: 0.6 mg/dL (ref 0.57–1.00)
GFR calc Af Amer: 108 mL/min/{1.73_m2} (ref >59)
GFR calc non Af Amer: 93 mL/min/{1.73_m2} (ref >59)
GLOBULIN, TOTAL: 2.5 g/dL (ref 1.5–4.5)
Glucose: 96 mg/dL (ref 65–99)
POTASSIUM: 4.2 mmol/L (ref 3.5–5.2)
SODIUM: 142 mmol/L (ref 134–144)
Total Protein: 7 g/dL (ref 6.0–8.5)

## 2017-03-25 LAB — CBC WITH DIFFERENTIAL/PLATELET
BASOS: 0 %
Basophils Absolute: 0 10*3/uL (ref 0.0–0.2)
EOS (ABSOLUTE): 0.2 10*3/uL (ref 0.0–0.4)
EOS: 2 %
HEMATOCRIT: 42.9 % (ref 34.0–46.6)
Hemoglobin: 14.2 g/dL (ref 11.1–15.9)
Immature Grans (Abs): 0 10*3/uL (ref 0.0–0.1)
Immature Granulocytes: 0 %
LYMPHS ABS: 4.9 10*3/uL — AB (ref 0.7–3.1)
Lymphs: 51 %
MCH: 28 pg (ref 26.6–33.0)
MCHC: 33.1 g/dL (ref 31.5–35.7)
MCV: 85 fL (ref 79–97)
MONOS ABS: 0.6 10*3/uL (ref 0.1–0.9)
Monocytes: 6 %
NEUTROS ABS: 4.1 10*3/uL (ref 1.4–7.0)
Neutrophils: 41 %
Platelets: 274 10*3/uL (ref 150–379)
RBC: 5.07 x10E6/uL (ref 3.77–5.28)
RDW: 14.2 % (ref 12.3–15.4)
WBC: 9.8 10*3/uL (ref 3.4–10.8)

## 2017-03-25 LAB — HEPATITIS C ANTIBODY: Hep C Virus Ab: 0.1 s/co ratio (ref 0.0–0.9)

## 2017-03-27 ENCOUNTER — Encounter: Payer: Self-pay | Admitting: Radiology

## 2017-04-16 DIAGNOSIS — C4922 Malignant neoplasm of connective and soft tissue of left lower limb, including hip: Secondary | ICD-10-CM | POA: Diagnosis not present

## 2017-04-16 DIAGNOSIS — Z85828 Personal history of other malignant neoplasm of skin: Secondary | ICD-10-CM | POA: Diagnosis not present

## 2017-04-23 DIAGNOSIS — Z48817 Encounter for surgical aftercare following surgery on the skin and subcutaneous tissue: Secondary | ICD-10-CM | POA: Diagnosis not present

## 2017-04-26 DIAGNOSIS — I999 Unspecified disorder of circulatory system: Secondary | ICD-10-CM | POA: Diagnosis not present

## 2017-04-28 ENCOUNTER — Institutional Professional Consult (permissible substitution): Payer: Medicare HMO | Admitting: Internal Medicine

## 2017-04-30 ENCOUNTER — Encounter: Payer: Self-pay | Admitting: Pulmonary Disease

## 2017-04-30 ENCOUNTER — Ambulatory Visit (INDEPENDENT_AMBULATORY_CARE_PROVIDER_SITE_OTHER): Payer: Medicare HMO | Admitting: Pulmonary Disease

## 2017-04-30 VITALS — BP 136/90 | HR 99 | Ht 60.5 in

## 2017-04-30 DIAGNOSIS — J4520 Mild intermittent asthma, uncomplicated: Secondary | ICD-10-CM | POA: Diagnosis not present

## 2017-04-30 DIAGNOSIS — G4733 Obstructive sleep apnea (adult) (pediatric): Secondary | ICD-10-CM | POA: Diagnosis not present

## 2017-04-30 NOTE — Progress Notes (Signed)
Subjective:    Patient ID: Dana Daniels, female    DOB: 01/18/1946, 72 y.o.   MRN: [id number]  HPI  72 year old [place] woman who was resettled in [place] and now with her sons, presents to establish care for asthma and obstructive sleep apnea.  She reports adult onset asthma around 1998, and recurrent episodes of bronchitis at least once a year.  She has intermittent wheezing.  She was initially started on Flovent by her pulmonologist in [place] with good results, this was changed to Qvar more recently to for insurance reasons.  For some reason albuterol type medications were discontinued after initial use and she is now maintained on Spiriva Respimat with Atrovent MDI and Atrovent nebs to use on an as-needed basis.  She is also maintained on Flonase and Singulair.  She had her nebulizer in [place] but when she moved, did not bring this with her to [place]. 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 She reports good improvement with Spiriva and Atrovent and wheezing has completely subsided. Her last flareup was about 2 weeks ago when she had to use her rescue inhaler. She denies frequent use of steroid type medications.  She was diagnosed with obstructive sleep apnea about a year ago and placed on nasal CPAP with good improvement in her daytime somnolence and snoring.  She does feel that the pressure is set low and needs more air.  Past medical history otherwise includes angina, hypertension and hyperlipidemia. 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 She is widowed and lives with her son goal.  Spirometry today was a poor effort but showed moderate restriction with ratio of 80 and FEV1 of 65% and FVC of 61%   Past Medical History:  Diagnosis Date  . Allergy   . Arthritis   . Asthma   .  Blood transfusion without reported diagnosis   . Cataract   . Hyperlipidemia   . Hypertension   . Migraine   . Osteoporosis   . Sleep apnea    Past Surgical History:  Procedure Laterality Date  . ABDOMINAL HYSTERECTOMY    . CHOLECYSTECTOMY      Allergies  Allergen Reactions  . Tramadol Itching     Social History   Socioeconomic History  . Marital status: Widowed    Spouse name: Not on file  . Number of children: Not on file  . Years of education: Not on file  . Highest education level: Not on file  Social Needs  . Financial resource strain: Not on file  . Food insecurity - worry: Not on file  . Food insecurity - inability: Not on file  . Transportation needs - medical: Not on file  . Transportation needs - non-medical: Not on file  Occupational History  . Not on file  Tobacco Use  . Smoking status: Never Smoker  . Smokeless tobacco: Never Used  Substance and Sexual Activity  . Alcohol use: No  . Drug use: Not on file  . Sexual activity: Not on file  Other Topics Concern  . Not on file  Social History Narrative  . Not on file     Family History  Problem Relation Age of Onset  . Heart disease Mother   . Hyperlipidemia Mother   . Hypertension Mother   . Stroke Mother   . Heart disease Father   .  Stroke Father      Review of Systems Positive for shortness of breath with rest and activity, intermittent wheezing, headaches anxiety and joint stiffness    Objective:   Physical Exam  Gen. Pleasant, well-nourished, in no distress, normal affect ENT - no lesions, no post nasal drip Neck: No JVD, no thyromegaly, no carotid bruits Lungs: no use of accessory muscles, no dullness to percussion, clear without rales or rhonchi  Cardiovascular: Rhythm regular, heart sounds  normal, no murmurs or gallops, no peripheral edema Abdomen: soft and non-tender, no hepatosplenomegaly, BS normal. Musculoskeletal: No deformities, no cyanosis or clubbing Neuro:  alert, non  focal       Assessment & Plan:

## 2017-04-30 NOTE — Assessment & Plan Note (Signed)
We will set you up with a DME to obtain CPAP supplies and obtain a report from your CPAP machine and adjust pressure if required  Weight loss encouraged, compliance with goal of at least 4-6 hrs every night is the expectation. Advised against medications with sedative side effects Cautioned against driving when sleepy - understanding that sleepiness will vary on a day to day basis

## 2017-04-30 NOTE — Assessment & Plan Note (Signed)
Refills on Qvar and Spiriva Surprisingly she is maintained on anticholinergics or other than albuterol to bronchodilators but this seems to have worked well for her so we will not change this at this time before reviewing the records from her PCP  We will obtain records from [place] including your sleep study and prior breathing test and x-rays.

## 2017-04-30 NOTE — Patient Instructions (Signed)
Refills on Qvar and Spiriva  We will obtain records from [place] including your sleep study and prior breathing test and x-rays.  We will set you up with a DME to obtain CPAP supplies and obtain a report from your CPAP machine and adjust pressure if required

## 2017-05-13 ENCOUNTER — Telehealth: Payer: Self-pay | Admitting: Pulmonary Disease

## 2017-05-13 MED ORDER — TIOTROPIUM BROMIDE MONOHYDRATE 18 MCG IN CAPS: 18.0000 ug | ORAL_CAPSULE | Freq: Every day | RESPIRATORY_TRACT | 5 refills | Status: DC

## 2017-05-13 MED ORDER — BECLOMETHASONE DIPROP HFA 80 MCG/ACT IN AERB: 2.0000 | INHALATION_SPRAY | Freq: Two times a day (BID) | RESPIRATORY_TRACT | 5 refills | Status: DC

## 2017-05-13 NOTE — Telephone Encounter (Signed)
Spoke with pt's granddaughter due to the pt not speaking [place]. Pt needs refills on Qvar and Spiriva. These have been sent in. Nothing further was needed.

## 2017-05-15 NOTE — Progress Notes (Signed)
Office Visit Note  Patient: Dana Daniels             Date of Birth: 1945/04/24           MRN: [id number]             PCP: Agustina Caroli, MD Referring: Horald Pollen, * Visit Date: 05/27/2017 Occupation: quality control   Interpreter: Derenda Mis   Subjective:  Other (polyarthralgia )   History of Present Illness: Dana Daniels is a 72 y.o. female seen in consultation per request of her PCP.  According to patient she has had history of discomfort in her hands for many years.  She works in the [employer] and has to use her hands repeatedly.  She also experiencing some numbness in the fingertips when she is doing some fine movement.  She has had discomfort in her bilateral shoulders for many years.  She has pain in her bilateral knee joints for many years.  She states she had recently left foot surgery for hemangioendothelioma.  She continues to have discomfort in her bilateral elbows and bilateral feet as well.  She notices swelling in her hands and feet.  Activities of Daily Living:  Patient reports morning stiffness for 55 minutes.   Patient Denies nocturnal pain.  Difficulty dressing/grooming: Reports Difficulty climbing stairs: Reports Difficulty getting out of chair: Reports Difficulty using hands for taps, buttons, cutlery, and/or writing: Reports   Review of Systems  Constitutional: Positive for activity change, fatigue and weight gain. Negative for night sweats and weight loss.  HENT: Negative for mouth sores, trouble swallowing, trouble swallowing, mouth dryness and nose dryness.   Eyes: Positive for dryness. Negative for pain, redness and visual disturbance.  Respiratory: Negative for cough, shortness of breath, wheezing and difficulty breathing.   Cardiovascular: Negative for chest pain, palpitations, hypertension and irregular heartbeat.  Gastrointestinal: Positive for heartburn. Negative for blood in stool, constipation and diarrhea.    Endocrine: Negative for increased urination.  Genitourinary: Negative for difficulty urinating and vaginal dryness.  Musculoskeletal: Positive for morning stiffness. Negative for arthralgias, joint pain, joint swelling, myalgias, muscle weakness, muscle tenderness and myalgias.  Skin: Negative for color change, rash, hair loss, skin tightness, ulcers and sensitivity to sunlight.  Allergic/Immunologic: Negative for susceptible to infections.  Neurological: Positive for numbness. Negative for dizziness, memory loss, night sweats and weakness.  Hematological: Negative for bruising/bleeding tendency and swollen glands.  Psychiatric/Behavioral: Positive for sleep disturbance. Negative for depressed mood. The patient is not nervous/anxious.     PMFS History:  Patient Active Problem List   Diagnosis Date Noted  . Osteoarthritis of multiple joints 03/24/2017  . Chronic pain of left knee 03/24/2017  . Chronic obstructive pulmonary disease ([place]) 03/24/2017  . Primary cancer of skin of left foot 02/03/2017  . Mild intermittent asthma without complication 24/40/1027  . Sleep apnea 02/03/2017  . Dyslipidemia 02/03/2017  . Migraine 02/03/2017  . Essential hypertension 09/18/2008  . ALLERGIC RHINITIS 09/18/2008  . ASTHMA 09/18/2008  . COUGH 09/18/2008    Past Medical History:  Diagnosis Date  . Allergy   . Arthritis   . Asthma   . Blood transfusion without reported diagnosis   . Cataract   . Hyperlipidemia   . Hypertension   . Migraine   . Osteoporosis   . Sleep apnea     Family History  Problem Relation Age of Onset  . Heart disease Mother   . Hyperlipidemia Mother   . Hypertension Mother   .  Stroke Mother   . Heart disease Father   . Stroke Father   . Hypertension Son   . Hyperlipidemia Son   . Fibromyalgia Son   . Lupus Son   . Hypertension Son   . Diabetes Daughter   . Hypertension Daughter    Past Surgical History:  Procedure Laterality Date  . ABDOMINAL HYSTERECTOMY     . CHOLECYSTECTOMY    . EYE SURGERY Left    cataract   . FOOT SURGERY Left    Social History   Social History Narrative  . Not on file     Objective: Vital Signs: BP 118/61 (BP Location: Left Arm, Patient Position: Sitting, Cuff Size: Normal)   Pulse 75   Resp 15   Ht 5' (1.524 m)   Wt 166 lb (75.3 kg)   BMI 32.42 kg/m    Physical Exam  Constitutional: She is oriented to person, place, and time. She appears well-developed and well-nourished.  HENT:  Head: Normocephalic and atraumatic.  Eyes: Conjunctivae and EOM are normal.  Neck: Normal range of motion.  Cardiovascular: Normal rate, regular rhythm, normal heart sounds and intact distal pulses.  Pulmonary/Chest: Effort normal and breath sounds normal.  Abdominal: Soft. Bowel sounds are normal.  Lymphadenopathy:    She has no cervical adenopathy.  Neurological: She is alert and oriented to person, place, and time.  Skin: Skin is warm and dry. Capillary refill takes less than 2 seconds.  Psychiatric: She has a normal mood and affect. Her behavior is normal.  Nursing note and vitals reviewed.    Musculoskeletal Exam: C-spine, thoracic and lumbar spine limited range of motion.  Range of motion of her left shoulder joint, elbow  joints, wrist joints were in good range of motion.  She has no synovitis or swelling over her MCP joints.  She has DIP and PIP thickening in her hands consistent with osteoarthritis.  She has some discomfort with range of motion of her hip joints, bilateral knee joints.  She has some warmth on palpation of her bilateral knee joints.  CDAI Exam: No CDAI exam completed.    Investigation: No additional findings. CBC Latest Ref Rng & Units 03/24/2017 03/29/2016  WBC 3.4 - 10.8 x10E3/uL 9.8 10.4  Hemoglobin 11.1 - 15.9 g/dL 14.2 14.4  Hematocrit 34.0 - 46.6 % 42.9 44.2  Platelets 150 - 379 x10E3/uL 274 358   CMP Latest Ref Rng & Units 03/24/2017  Glucose 65 - 99 mg/dL 96  BUN 8 - 27 mg/dL 9   Creatinine 0.57 - 1.00 mg/dL 0.60  Sodium 134 - 144 mmol/L 142  Potassium 3.5 - 5.2 mmol/L 4.2  Chloride 96 - 106 mmol/L 104  CO2 20 - 29 mmol/L 22  Calcium 8.7 - 10.3 mg/dL 9.5  Total Protein 6.0 - 8.5 g/dL 7.0  Total Bilirubin 0.0 - 1.2 mg/dL 0.2  Alkaline Phos 39 - 117 IU/L 99  AST 0 - 40 IU/L 20  ALT 0 - 32 IU/L 23    Imaging: Xr Hip Unilat W Or W/o Pelvis 2-3 Views Left  Result Date: 05/27/2017 No SI joint narrowing was noted.  No hip joint narrowing was noted. Impression: Unremarkable x-ray of the hip joint.  Xr Hip Unilat W Or W/o Pelvis 2-3 Views Right  Result Date: 05/27/2017 No SI joint narrowing or hip joint narrowing was noted. Impression: Unremarkable x-ray of the hip joint.  Xr Hand 2 View Left  Result Date: 05/27/2017 No MCP, intercarpal or radiocarpal joint space narrowing  was noted.  PIP and DIP narrowing with severe DIP changes were noted. Impression: These findings are consistent with osteoarthritis of the hand.  Xr Hand 2 View Right  Result Date: 05/27/2017 Severe DIP narrowing was noted.  Severe PIP narrowing was noted.  No MCP, intercarpal or radiocarpal changes were noted.  No juxta-articular osteopenia was noted.  No erosive changes were noted. Impression: These findings are consistent with osteoarthritis of the hand.  Xr Knee 3 View Left  Result Date: 05/27/2017 Moderate medial compartment narrowing without chondrocalcinosis.  Severe chondromalacia patella. Impression: These findings are consistent with moderate osteoarthritis and severe chondromalacia patella.  Xr Knee 3 View Right  Result Date: 05/27/2017 Moderate medial compartment narrowing without chondrocalcinosis.  Severe chondromalacia patella. Impression: These findings are consistent with moderate osteoarthritis and severe chondromalacia patella.  Xr Shoulder Left  Result Date: 05/27/2017 No glenohumeral joint space narrowing was noted.  Acromioclavicular joint space narrowing was noted.  Impression: Acromial clavicular joint arthritis.   Speciality Comments: No specialty comments available.    Procedures:  No procedures performed Allergies: Tramadol   Assessment / Plan:     Visit Diagnoses: Polyarthralgia: Patient complains of pain in multiple joints.  Pain in both hands -clinical findings with DIP PIP thickening are consistent with osteoarthritis.  Joint protection and muscle strengthening was discussed.  A handout on hand exercises was given.  Plan: XR Hand 2 View Right, XR Hand 2 View Left.  She has severe DIP arthritis.  Patient has been taking Tylenol which has been controlling her symptoms.  No additional blood work is required.  Chronic pain of both shoulders -she has discomfort with range of motion of her left shoulder.  Plan: XR Shoulder Left.  The x-ray revealed acromioclavicular arthritis.  Shoulder joint exercise handout was given.  Chronic pain of both hips -she has painful range of motion of bilateral hip joints.  Plan: XR HIP UNILAT W OR W/O PELVIS 2-3 VIEWS RIGHT, XR HIP UNILAT W OR W/O PELVIS 2-3 VIEWS LEFT.  The x-ray of the hip joints were unremarkable.  Chronic pain of both knees -she has discomfort range of motion of bilateral knee joints with some warmth on palpation.  Plan: XR KNEE 3 VIEW RIGHT, XR KNEE 3 VIEW LEFT.  The knee joint showed bilateral moderate medial compartment narrowing and severe chondromalacia patella.  Weight loss diet and exercise along with exercise was discussed.  A handout on knee exercises was given.  Other medical problems are listed as follows:  Age-related osteoporosis without current pathological fracture  Essential hypertension: Her blood pressure is controlled today.  History of COPD  Dyslipidemia  History of migraine  Hemangioendothelioma - Left foot status post surgery x3, last surgery February 2019.    Orders: Orders Placed This Encounter  Procedures  . XR KNEE 3 VIEW RIGHT  . XR KNEE 3 VIEW LEFT  . XR  Hand 2 View Right  . XR Hand 2 View Left  . XR Shoulder Left  . XR HIP UNILAT W OR W/O PELVIS 2-3 VIEWS RIGHT  . XR HIP UNILAT W OR W/O PELVIS 2-3 VIEWS LEFT   No orders of the defined types were placed in this encounter.   Face-to-face time spent with patient was 60 minutes.  Greater than 50% of time was spent in counseling and coordination of care.  Follow-Up Instructions: Return if symptoms worsen or fail to improve, for Polyarthralgia, osteoarthritis.   Bo Merino, MD  Note - This record has been created using  Dragon software.  Chart creation errors have been sought, but may not always  have been located. Such creation errors do not reflect on  the standard of medical care. 

## 2017-05-22 ENCOUNTER — Other Ambulatory Visit: Payer: Self-pay | Admitting: Emergency Medicine

## 2017-05-22 DIAGNOSIS — E785 Hyperlipidemia, unspecified: Secondary | ICD-10-CM

## 2017-05-22 MED ORDER — MONTELUKAST SODIUM 10 MG PO TABS: 10.0000 mg | ORAL_TABLET | Freq: Every day | ORAL | 1 refills | Status: DC

## 2017-05-22 MED ORDER — SIMVASTATIN 20 MG PO TABS: 20.0000 mg | ORAL_TABLET | Freq: Every day | ORAL | 1 refills | Status: DC

## 2017-05-22 MED ORDER — OMEPRAZOLE 20 MG PO CPDR: 20.0000 mg | DELAYED_RELEASE_CAPSULE | Freq: Every day | ORAL | 0 refills | Status: DC

## 2017-05-22 MED ORDER — IPRATROPIUM BROMIDE 0.02 % IN SOLN: 0.5000 mg | Freq: Four times a day (QID) | RESPIRATORY_TRACT | 3 refills | Status: DC | PRN

## 2017-05-27 ENCOUNTER — Ambulatory Visit (INDEPENDENT_AMBULATORY_CARE_PROVIDER_SITE_OTHER): Payer: Self-pay

## 2017-05-27 ENCOUNTER — Encounter: Payer: Self-pay | Admitting: Rheumatology

## 2017-05-27 ENCOUNTER — Ambulatory Visit (INDEPENDENT_AMBULATORY_CARE_PROVIDER_SITE_OTHER): Payer: Medicare HMO | Admitting: Rheumatology

## 2017-05-27 VITALS — BP 118/61 | HR 75 | Resp 15 | Ht 60.0 in | Wt 166.0 lb

## 2017-05-27 DIAGNOSIS — M25552 Pain in left hip: Secondary | ICD-10-CM

## 2017-05-27 DIAGNOSIS — M79641 Pain in right hand: Secondary | ICD-10-CM

## 2017-05-27 DIAGNOSIS — Z8669 Personal history of other diseases of the nervous system and sense organs: Secondary | ICD-10-CM

## 2017-05-27 DIAGNOSIS — I10 Essential (primary) hypertension: Secondary | ICD-10-CM

## 2017-05-27 DIAGNOSIS — M25561 Pain in right knee: Secondary | ICD-10-CM | POA: Diagnosis not present

## 2017-05-27 DIAGNOSIS — M2550 Pain in unspecified joint: Secondary | ICD-10-CM

## 2017-05-27 DIAGNOSIS — M25511 Pain in right shoulder: Secondary | ICD-10-CM

## 2017-05-27 DIAGNOSIS — M25562 Pain in left knee: Secondary | ICD-10-CM

## 2017-05-27 DIAGNOSIS — M810 Age-related osteoporosis without current pathological fracture: Secondary | ICD-10-CM | POA: Diagnosis not present

## 2017-05-27 DIAGNOSIS — E785 Hyperlipidemia, unspecified: Secondary | ICD-10-CM

## 2017-05-27 DIAGNOSIS — M79642 Pain in left hand: Secondary | ICD-10-CM

## 2017-05-27 DIAGNOSIS — D489 Neoplasm of uncertain behavior, unspecified: Secondary | ICD-10-CM

## 2017-05-27 DIAGNOSIS — Z8709 Personal history of other diseases of the respiratory system: Secondary | ICD-10-CM

## 2017-05-27 DIAGNOSIS — G8929 Other chronic pain: Secondary | ICD-10-CM

## 2017-05-27 DIAGNOSIS — M25551 Pain in right hip: Secondary | ICD-10-CM | POA: Diagnosis not present

## 2017-05-27 DIAGNOSIS — M25512 Pain in left shoulder: Secondary | ICD-10-CM

## 2017-05-27 NOTE — Patient Instructions (Signed)
Shoulder Exercises Ask your health care provider which exercises are safe for you. Do exercises exactly as told by your health care provider and adjust them as directed. It is normal to feel mild stretching, pulling, tightness, or discomfort as you do these exercises, but you should stop right away if you feel sudden pain or your pain gets worse.Do not begin these exercises until told by your health care provider. RANGE OF MOTION EXERCISES These exercises warm up your muscles and joints and improve the movement and flexibility of your shoulder. These exercises also help to relieve pain, numbness, and tingling. These exercises involve stretching your injured shoulder directly. Exercise A: Pendulum  1. Stand near a wall or a surface that you can hold onto for balance. 2. Bend at the waist and let your left / right arm hang straight down. Use your other arm to support you. Keep your back straight and do not lock your knees. 3. Relax your left / right arm and shoulder muscles, and move your hips and your trunk so your left / right arm swings freely. Your arm should swing because of the motion of your body, not because you are using your arm or shoulder muscles. 4. Keep moving your body so your arm swings in the following directions, as told by your health care provider: ? Side to side. ? Forward and backward. ? In clockwise and counterclockwise circles. 5. Continue each motion for __________ seconds, or for as long as told by your health care provider. 6. Slowly return to the starting position. Repeat __________ times. Complete this exercise __________ times a day. Exercise B:Flexion, Standing  1. Stand and hold a broomstick, a cane, or a similar object. Place your hands a little more than shoulder-width apart on the object. Your left / right hand should be palm-up, and your other hand should be palm-down. 2. Keep your elbow straight and keep your shoulder muscles relaxed. Push the stick down with  your healthy arm to raise your left / right arm in front of your body, and then over your head until you feel a stretch in your shoulder. ? Avoid shrugging your shoulder while you raise your arm. Keep your shoulder blade tucked down toward the middle of your back. 3. Hold for __________ seconds. 4. Slowly return to the starting position. Repeat __________ times. Complete this exercise __________ times a day. Exercise C: Abduction, Standing 1. Stand and hold a broomstick, a cane, or a similar object. Place your hands a little more than shoulder-width apart on the object. Your left / right hand should be palm-up, and your other hand should be palm-down. 2. While keeping your elbow straight and your shoulder muscles relaxed, push the stick across your body toward your left / right side. Raise your left / right arm to the side of your body and then over your head until you feel a stretch in your shoulder. ? Do not raise your arm above shoulder height, unless your health care provider tells you to do that. ? Avoid shrugging your shoulder while you raise your arm. Keep your shoulder blade tucked down toward the middle of your back. 3. Hold for __________ seconds. 4. Slowly return to the starting position. Repeat __________ times. Complete this exercise __________ times a day. Exercise D:Internal Rotation  1. Place your left / right hand behind your back, palm-up. 2. Use your other hand to dangle an exercise band, a towel, or a similar object over your shoulder. Grasp the band with   your left / right hand so you are holding onto both ends. 3. Gently pull up on the band until you feel a stretch in the front of your left / right shoulder. ? Avoid shrugging your shoulder while you raise your arm. Keep your shoulder blade tucked down toward the middle of your back. 4. Hold for __________ seconds. 5. Release the stretch by letting go of the band and lowering your hands. Repeat __________ times. Complete  this exercise __________ times a day. STRETCHING EXERCISES These exercises warm up your muscles and joints and improve the movement and flexibility of your shoulder. These exercises also help to relieve pain, numbness, and tingling. These exercises are done using your healthy shoulder to help stretch the muscles of your injured shoulder. Exercise E: Corner Stretch (External Rotation and Abduction)  1. Stand in a doorway with one of your feet slightly in front of the other. This is called a staggered stance. If you cannot reach your forearms to the door frame, stand facing a corner of a room. 2. Choose one of the following positions as told by your health care provider: ? Place your hands and forearms on the door frame above your head. ? Place your hands and forearms on the door frame at the height of your head. ? Place your hands on the door frame at the height of your elbows. 3. Slowly move your weight onto your front foot until you feel a stretch across your chest and in the front of your shoulders. Keep your head and chest upright and keep your abdominal muscles tight. 4. Hold for __________ seconds. 5. To release the stretch, shift your weight to your back foot. Repeat __________ times. Complete this stretch __________ times a day. Exercise F:Extension, Standing 1. Stand and hold a broomstick, a cane, or a similar object behind your back. ? Your hands should be a little wider than shoulder-width apart. ? Your palms should face away from your back. 2. Keeping your elbows straight and keeping your shoulder muscles relaxed, move the stick away from your body until you feel a stretch in your shoulder. ? Avoid shrugging your shoulders while you move the stick. Keep your shoulder blade tucked down toward the middle of your back. 3. Hold for __________ seconds. 4. Slowly return to the starting position. Repeat __________ times. Complete this exercise __________ times a day. STRENGTHENING  EXERCISES These exercises build strength and endurance in your shoulder. Endurance is the ability to use your muscles for a long time, even after they get tired. Exercise G:External Rotation  1. Sit in a stable chair without armrests. 2. Secure an exercise band at elbow height on your left / right side. 3. Place a soft object, such as a folded towel or a small pillow, between your left / right upper arm and your body to move your elbow a few inches away (about 10 cm) from your side. 4. Hold the end of the band so it is tight and there is no slack. 5. Keeping your elbow pressed against the soft object, move your left / right forearm out, away from your abdomen. Keep your body steady so only your forearm moves. 6. Hold for __________ seconds. 7. Slowly return to the starting position. Repeat __________ times. Complete this exercise __________ times a day. Exercise H:Shoulder Abduction  1. Sit in a stable chair without armrests, or stand. 2. Hold a __________ weight in your left / right hand, or hold an exercise band with both hands.   3. Start with your arms straight down and your left / right palm facing in, toward your body. 4. Slowly lift your left / right hand out to your side. Do not lift your hand above shoulder height unless your health care provider tells you that this is safe. ? Keep your arms straight. ? Avoid shrugging your shoulder while you do this movement. Keep your shoulder blade tucked down toward the middle of your back. 5. Hold for __________ seconds. 6. Slowly lower your arm, and return to the starting position. Repeat __________ times. Complete this exercise __________ times a day. Exercise I:Shoulder Extension 1. Sit in a stable chair without armrests, or stand. 2. Secure an exercise band to a stable object in front of you where it is at shoulder height. 3. Hold one end of the exercise band in each hand. Your palms should face each other. 4. Straighten your elbows and  lift your hands up to shoulder height. 5. Step back, away from the secured end of the exercise band, until the band is tight and there is no slack. 6. Squeeze your shoulder blades together as you pull your hands down to the sides of your thighs. Stop when your hands are straight down by your sides. Do not let your hands go behind your body. 7. Hold for __________ seconds. 8. Slowly return to the starting position. Repeat __________ times. Complete this exercise __________ times a day. Exercise J:Standing Shoulder Row 1. Sit in a stable chair without armrests, or stand. 2. Secure an exercise band to a stable object in front of you so it is at waist height. 3. Hold one end of the exercise band in each hand. Your palms should be in a thumbs-up position. 4. Bend each of your elbows to an "L" shape (about 90 degrees) and keep your upper arms at your sides. 5. Step back until the band is tight and there is no slack. 6. Slowly pull your elbows back behind you. 7. Hold for __________ seconds. 8. Slowly return to the starting position. Repeat __________ times. Complete this exercise __________ times a day. Exercise K:Shoulder Press-Ups  1. Sit in a stable chair that has armrests. Sit upright, with your feet flat on the floor. 2. Put your hands on the armrests so your elbows are bent and your fingers are pointing forward. Your hands should be about even with the sides of your body. 3. Push down on the armrests and use your arms to lift yourself off of the chair. Straighten your elbows and lift yourself up as much as you comfortably can. ? Move your shoulder blades down, and avoid letting your shoulders move up toward your ears. ? Keep your feet on the ground. As you get stronger, your feet should support less of your body weight as you lift yourself up. 4. Hold for __________ seconds. 5. Slowly lower yourself back into the chair. Repeat __________ times. Complete this exercise __________ times a  day. Exercise L: Wall Push-Ups  1. Stand so you are facing a stable wall. Your feet should be about one arm-length away from the wall. 2. Lean forward and place your palms on the wall at shoulder height. 3. Keep your feet flat on the floor as you bend your elbows and lean forward toward the wall. 4. Hold for __________ seconds. 5. Straighten your elbows to push yourself back to the starting position. Repeat __________ times. Complete this exercise __________ times a day. This information is not intended to replace advice   given to you by your health care provider. Make sure you discuss any questions you have with your health care provider. Document Released: 01/01/2005 Document Revised: 11/12/2015 Document Reviewed: 10/29/2014 [employer] Interactive Patient Education  2018 [employer] Knee Exercises Ask your health care provider which exercises are safe for you. Do exercises exactly as told by your health care provider and adjust them as directed. It is normal to feel mild stretching, pulling, tightness, or discomfort as you do these exercises, but you should stop right away if you feel sudden pain or your pain gets worse.Do not begin these exercises until told by your health care provider. STRETCHING AND RANGE OF MOTION EXERCISES These exercises warm up your muscles and joints and improve the movement and flexibility of your knee. These exercises also help to relieve pain, numbness, and tingling. Exercise A: Knee Extension, Prone 1. Lie on your abdomen on a bed. 2. Place your left / right knee just beyond the edge of the surface so your knee is not on the bed. You can put a towel under your left / right thigh just above your knee for comfort. 3. Relax your leg muscles and allow gravity to straighten your knee. You should feel a stretch behind your left / right knee. 4. Hold this position for __________ seconds. 5. Scoot up so your knee is supported between repetitions. Repeat __________  times. Complete this stretch __________ times a day. Exercise B: Knee Flexion, Active  1. Lie on your back with both knees straight. If this causes back discomfort, bend your left / right knee so your foot is flat on the floor. 2. Slowly slide your left / right heel back toward your buttocks until you feel a gentle stretch in the front of your knee or thigh. 3. Hold this position for __________ seconds. 4. Slowly slide your left / right heel back to the starting position. Repeat __________ times. Complete this exercise __________ times a day. Exercise C: Quadriceps, Prone  1. Lie on your abdomen on a firm surface, such as a bed or padded floor. 2. Bend your left / right knee and hold your ankle. If you cannot reach your ankle or pant leg, loop a belt around your foot and grab the belt instead. 3. Gently pull your heel toward your buttocks. Your knee should not slide out to the side. You should feel a stretch in the front of your thigh and knee. 4. Hold this position for __________ seconds. Repeat __________ times. Complete this stretch __________ times a day. Exercise D: Hamstring, Supine 1. Lie on your back. 2. Loop a belt or towel over the ball of your left / right foot. The ball of your foot is on the walking surface, right under your toes. 3. Straighten your left / right knee and slowly pull on the belt to raise your leg until you feel a gentle stretch behind your knee. ? Do not let your left / right knee bend while you do this. ? Keep your other leg flat on the floor. 4. Hold this position for __________ seconds. Repeat __________ times. Complete this stretch __________ times a day. STRENGTHENING EXERCISES These exercises build strength and endurance in your knee. Endurance is the ability to use your muscles for a long time, even after they get tired. Exercise E: Quadriceps, Isometric  1. Lie on your back with your left / right leg extended and your other knee bent. Put a rolled towel  or small pillow under your knee if   told by your health care provider. 2. Slowly tense the muscles in the front of your left / right thigh. You should see your kneecap slide up toward your hip or see increased dimpling just above the knee. This motion will push the back of the knee toward the floor. 3. For __________ seconds, keep the muscle as tight as you can without increasing your pain. 4. Relax the muscles slowly and completely. Repeat __________ times. Complete this exercise __________ times a day. Exercise F: Straight Leg Raises - Quadriceps 1. Lie on your back with your left / right leg extended and your other knee bent. 2. Tense the muscles in the front of your left / right thigh. You should see your kneecap slide up or see increased dimpling just above the knee. Your thigh may even shake a bit. 3. Keep these muscles tight as you raise your leg 4-6 inches (10-15 cm) off the floor. Do not let your knee bend. 4. Hold this position for __________ seconds. 5. Keep these muscles tense as you lower your leg. 6. Relax your muscles slowly and completely after each repetition. Repeat __________ times. Complete this exercise __________ times a day. Exercise G: Hamstring, Isometric 1. Lie on your back on a firm surface. 2. Bend your left / right knee approximately __________ degrees. 3. Dig your left / right heel into the surface as if you are trying to pull it toward your buttocks. Tighten the muscles in the back of your thighs to dig as hard as you can without increasing any pain. 4. Hold this position for __________ seconds. 5. Release the tension gradually and allow your muscles to relax completely for __________ seconds after each repetition. Repeat __________ times. Complete this exercise __________ times a day. Exercise H: Hamstring Curls  If told by your health care provider, do this exercise while wearing ankle weights. Begin with __________ weights. Then increase the weight by 1 lb (0.5  kg) increments. Do not wear ankle weights that are more than __________. 1. Lie on your abdomen with your legs straight. 2. Bend your left / right knee as far as you can without feeling pain. Keep your hips flat against the floor. 3. Hold this position for __________ seconds. 4. Slowly lower your leg to the starting position.  Repeat __________ times. Complete this exercise __________ times a day. Exercise I: Squats (Quadriceps) 1. Stand in front of a table, with your feet and knees pointing straight ahead. You may rest your hands on the table for balance but not for support. 2. Slowly bend your knees and lower your hips like you are going to sit in a chair. ? Keep your weight over your heels, not over your toes. ? Keep your lower legs upright so they are parallel with the table legs. ? Do not let your hips go lower than your knees. ? Do not bend lower than told by your health care provider. ? If your knee pain increases, do not bend as low. 3. Hold the squat position for __________ seconds. 4. Slowly push with your legs to return to standing. Do not use your hands to pull yourself to standing. Repeat __________ times. Complete this exercise __________ times a day. Exercise J: Wall Slides (Quadriceps)  1. Lean your back against a smooth wall or door while you walk your feet out 18-24 inches (46-61 cm) from it. 2. Place your feet hip-width apart. 3. Slowly slide down the wall or door until your knees bend __________ degrees. Keep   your knees over your heels, not over your toes. Keep your knees in line with your hips. 4. Hold for __________ seconds. Repeat __________ times. Complete this exercise __________ times a day. Exercise K: Straight Leg Raises - Hip Abductors 1. Lie on your side with your left / right leg in the top position. Lie so your head, shoulder, knee, and hip line up. You may bend your bottom knee to help you keep your balance. 2. Roll your hips slightly forward so your hips  are stacked directly over each other and your left / right knee is facing forward. 3. Leading with your heel, lift your top leg 4-6 inches (10-15 cm). You should feel the muscles in your outer hip lifting. ? Do not let your foot drift forward. ? Do not let your knee roll toward the ceiling. 4. Hold this position for __________ seconds. 5. Slowly return your leg to the starting position. 6. Let your muscles relax completely after each repetition. Repeat __________ times. Complete this exercise __________ times a day. Exercise L: Straight Leg Raises - Hip Extensors 1. Lie on your abdomen on a firm surface. You can put a pillow under your hips if that is more comfortable. 2. Tense the muscles in your buttocks and lift your left / right leg about 4-6 inches (10-15 cm). Keep your knee straight as you lift your leg. 3. Hold this position for __________ seconds. 4. Slowly lower your leg to the starting position. 5. Let your leg relax completely after each repetition. Repeat __________ times. Complete this exercise __________ times a day. This information is not intended to replace advice given to you by your health care provider. Make sure you discuss any questions you have with your health care provider. Document Released: 01/01/2005 Document Revised: 11/12/2015 Document Reviewed: 12/24/2014 [employer] Interactive Patient Education  2018 [employer] Exercises Hand exercises can be helpful to almost anyone. These exercises can strengthen the hands, improve flexibility and movement, and increase blood flow to the hands. These results can make work and daily tasks easier. Hand exercises can be especially helpful for people who have joint pain from arthritis or have nerve damage from overuse (carpal tunnel syndrome). These exercises can also help people who have injured a hand. Most of these hand exercises are fairly gentle stretching routines. You can do them often throughout the day. Still, it is a  good idea to ask your health care provider which exercises would be best for you. Warming your hands before exercise may help to reduce stiffness. You can do this with gentle massage or by placing your hands in warm water for 15 minutes. Also, make sure you pay attention to your level of hand pain as you begin an exercise routine. Exercises Knuckle Bend Repeat this exercise 5-10 times with each hand. 1. Stand or sit with your arm, hand, and all five fingers pointed straight up. Make sure your wrist is straight. 2. Gently and slowly bend your fingers down and inward until the tips of your fingers are touching the tops of your palm. 3. Hold this position for a few seconds. 4. Extend your fingers out to their original position, all pointing straight up again.  Finger Fan Repeat this exercise 5-10 times with each hand. 1. Hold your arm and hand out in front of you. Keep your wrist straight. 2. Squeeze your hand into a fist. 3. Hold this position for a few seconds. 4. Edison Simon out, or spread apart, your hand and fingers as  much as possible, stretching every joint fully.  Tabletop Repeat this exercise 5-10 times with each hand. 1. Stand or sit with your arm, hand, and all five fingers pointed straight up. Make sure your wrist is straight. 2. Gently and slowly bend your fingers at the knuckles where they meet the hand until your hand is making an upside-down L shape. Your fingers should form a tabletop. 3. Hold this position for a few seconds. 4. Extend your fingers out to their original position, all pointing straight up again.  Making Os Repeat this exercise 5-10 times with each hand. 1. Stand or sit with your arm, hand, and all five fingers pointed straight up. Make sure your wrist is straight. 2. Make an O shape by touching your pointer finger to your thumb. Hold for a few seconds. Then open your hand wide. 3. Repeat this motion with each finger on your hand.  Table Spread Repeat this exercise  5-10 times with each hand. 1. Place your hand on a table with your palm facing down. Make sure your wrist is straight. 2. Spread your fingers out as much as possible. Hold this position for a few seconds. 3. Slide your fingers back together again. Hold for a few seconds.  Ball Grip  Repeat this exercise 10-15 times with each hand. 1. Hold a tennis ball or another soft ball in your hand. 2. While slowly increasing pressure, squeeze the ball as hard as possible. 3. Squeeze as hard as you can for 3-5 seconds. 4. Relax and repeat.  Wrist Curls Repeat this exercise 10-15 times with each hand. 1. Sit in a chair that has armrests. 2. Hold a light weight in your hand, such as a dumbbell that weighs 1-3 pounds (0.5-1.4 kg). Ask your health care provider what weight would be best for you. 3. Rest your hand just over the end of the chair arm with your palm facing up. 4. Gently pivot your wrist up and down while holding the weight. Do not twist your wrist from side to side.  Contact a health care provider if:  Your hand pain or discomfort gets much worse when you do an exercise.  Your hand pain or discomfort does not improve within 2 hours after you exercise. If you have any of these problems, stop doing these exercises right away. Do not do them again unless your health care provider says that you can. Get help right away if:  You develop sudden, severe hand pain. If this happens, stop doing these exercises right away. Do not do them again unless your health care provider says that you can. This information is not intended to replace advice given to you by your health care provider. Make sure you discuss any questions you have with your health care provider. Document Released: 01/29/2015 Document Revised: 07/26/2015 Document Reviewed: 08/28/2014 [employer] Interactive Patient Education  [employer].

## 2017-05-28 ENCOUNTER — Ambulatory Visit: Payer: Medicare HMO | Admitting: Adult Health

## 2017-05-29 ENCOUNTER — Encounter: Payer: Self-pay | Admitting: Adult Health

## 2017-05-29 ENCOUNTER — Other Ambulatory Visit: Payer: Self-pay | Admitting: Emergency Medicine

## 2017-05-29 ENCOUNTER — Ambulatory Visit (INDEPENDENT_AMBULATORY_CARE_PROVIDER_SITE_OTHER): Payer: Medicare HMO | Admitting: Adult Health

## 2017-05-29 VITALS — BP 132/68 | HR 86 | Ht 60.0 in | Wt 166.7 lb

## 2017-05-29 DIAGNOSIS — R0601 Orthopnea: Secondary | ICD-10-CM | POA: Diagnosis not present

## 2017-05-29 DIAGNOSIS — R0602 Shortness of breath: Secondary | ICD-10-CM

## 2017-05-29 DIAGNOSIS — G4733 Obstructive sleep apnea (adult) (pediatric): Secondary | ICD-10-CM | POA: Diagnosis not present

## 2017-05-29 DIAGNOSIS — J4520 Mild intermittent asthma, uncomplicated: Secondary | ICD-10-CM

## 2017-05-29 DIAGNOSIS — R0600 Dyspnea, unspecified: Secondary | ICD-10-CM | POA: Insufficient documentation

## 2017-05-29 MED ORDER — ALBUTEROL SULFATE HFA 108 (90 BASE) MCG/ACT IN AERS: 2.0000 | INHALATION_SPRAY | RESPIRATORY_TRACT | 5 refills | Status: DC | PRN

## 2017-05-29 NOTE — Patient Instructions (Addendum)
Continue on current regimen  Set up for 2 D echo .  Stop Ipratropium inhaler and neb .  Continue on QVAR and Spriiva .  May use ProAir 2 puffs every 4hr as needed for wheezing .  Continue on Prilosec 20mg  Twice daily  .  Restart CPAP At bedtime  , try to wear for at least 4hr .  follow up Dr. Elsworth Soho  In 3 months and As needed   Please contact office for sooner follow up if symptoms do not improve or worsen or seek emergency care    Instrucciones Continuar con el rgimen actual. Configurar para eco 2D. Detener el inhalador Ipratropium y neb. Continuar en QVAR y [place]. Puede usar ProAir 2 inhalaciones cada 4 horas segn sea necesario para sibilancias. Continuar en Prilosec 20 mg [place]. Reinicie el CPAP A la hora de acostarse, intente usarlo durante al menos 4 horas. Seguimiento Dr. Elsworth Soho En 3 meses y segn sea necesario. Comunquese con la oficina para un seguimiento ms rpido si los sntomas no mejoran o empeoran o si buscan atencin de Freight forward[employer]

## 2017-05-29 NOTE — Assessment & Plan Note (Signed)
Appears controlled   Plan  . Patient Instructions  Continue on current regimen  Set up for 2 D echo .  Stop Ipratropium inhaler and neb .  Continue on QVAR and Spriiva .  May use ProAir 2 puffs every 4hr as needed for wheezing .  Continue on Prilosec 20mg  Twice daily  .  Restart CPAP At bedtime  , try to wear for at least 4hr .  follow up Dr. Elsworth Soho  In 3 months and As needed   Please contact office for sooner follow up if symptoms do not improve or worsen or seek emergency care    Instrucciones Continuar con el rgimen actual. Configurar para eco 2D. Detener el inhalador Ipratropium y neb. Continuar en QVAR y [place]. Puede usar ProAir 2 inhalaciones cada 4 horas segn sea necesario para sibilancias. Continuar en Prilosec 20 mg [place]. Reinicie el CPAP A la hora de acostarse, intente usarlo durante al menos 4 horas. Seguimiento Dr. Elsworth Soho En 3 meses y segn sea necesario. Comunquese con la oficina para un seguimiento ms rpido si los sntomas no mejoran o empeoran o si buscan atencin de Freight forwarder

## 2017-05-29 NOTE — Assessment & Plan Note (Signed)
4 pillow orthopnea ? eiology  Check 2 D echo .  Restart CPAP .

## 2017-05-29 NOTE — Addendum Note (Signed)
Addended by: Parke Poisson E on: 05/29/2017 03:02 PM   Modules accepted: Orders

## 2017-05-29 NOTE — Assessment & Plan Note (Signed)
Restart CPAP .  DME order to check CPAP and supplies order.

## 2017-05-29 NOTE — Progress Notes (Signed)
@Patient  ID: Dana Daniels, female    DOB: 1945-10-22, 72 y.o.   MRN: [id number]  Chief Complaint  Patient presents with  . Follow-up    Asthma     Referring provider: Agustina Caroli, MD  HPI: 72 year old female, [place], never smoker seen for pulmonary consult to establish for asthma and sleep apnea for over 28 2019  TEST  Spirometry today was a poor effort but showed moderate restriction with ratio of 80 and FEV1 of 65% and FVC of 61%    05/29/2017 Follow up ; Asthma and OSA (interpreter present for today's visit) Pt returns for 1 month follow up . Says breathing is at baseline. Has occasional wheezing on and off.  She is on Qvar and Spiriva.  She needs a refill on albuterol inhaler. She denies any chest pain , edema nausea vomiting diarrhea. She says she does have on and off reflux.  She takes Prilosec.  Wheezing and shortness of breath do seem to be worse at bedtime.  She says that she sleeps on 4 pillows due to shortness of breath.  Patient says when she was living in [place] that she was undergoing a cardiac workup.  From what it sounds like she had a cardiac cath.  Records are unavailable.  She says she is never had a echo performed.  Has OSA on CPAP At bedtime. Has not been able to wear it lately , waiting on supplies.  We discussed compliance and usage.    Allergies  Allergen Reactions  . Tramadol Itching    Immunization History  Administered Date(s) Administered  . Influenza-Unspecified 12/17/2016  . Pneumococcal-Unspecified 12/17/2016    Past Medical History:  Diagnosis Date  . Allergy   . Arthritis   . Asthma   . Blood transfusion without reported diagnosis   . Cataract   . Hyperlipidemia   . Hypertension   . Migraine   . Osteoporosis   . Sleep apnea     Tobacco History: Social History   Tobacco Use  Smoking Status Never Smoker  Smokeless Tobacco Never Used   Counseling given: Not Answered   Outpatient Encounter  Medications as of 05/29/2017  Medication Sig  . beclomethasone (QVAR REDIHALER) 80 MCG/ACT inhaler Inhale 2 puffs into the lungs 2 (two) times daily.  . butalbital-acetaminophen-caffeine (ESGIC) 50-325-40 MG tablet Take by mouth 2 (two) times daily as needed for headache.  . cetirizine (ZYRTEC) 10 MG tablet Take 1 tablet (10 mg total) by mouth daily.  . Cholecalciferol (VITAMIN D3) 10000 units TABS Take by mouth.  . Cholecalciferol 10000 units TABS Take 1 tablet by mouth daily. (Patient taking differently: Take 1 tablet by mouth once a week. )  . diltiazem (CARDIZEM CD) 240 MG 24 hr capsule Take 1 capsule (240 mg total) by mouth daily.  . montelukast (SINGULAIR) 10 MG tablet Take 1 tablet (10 mg total) by mouth at bedtime.  . nitroGLYCERIN (NITROSTAT) 0.4 MG SL tablet Place 0.4 mg under the tongue every 5 (five) minutes as needed for chest pain.  Marland Kitchen omeprazole (PRILOSEC) 20 MG capsule Take 1 capsule (20 mg total) by mouth daily.  . simvastatin (ZOCOR) 20 MG tablet Take 1 tablet (20 mg total) by mouth daily.  Marland Kitchen tiotropium (SPIRIVA) 18 MCG inhalation capsule Place 1 capsule (18 mcg total) into inhaler and inhale daily.  Marland Kitchen albuterol (PROVENTIL HFA;VENTOLIN HFA) 108 (90 Base) MCG/ACT inhaler Inhale 2 puffs into the lungs every 4 (four) hours as needed for wheezing or shortness of  breath.  Marland Kitchen amLODipine (NORVASC) 5 MG tablet Take 5 mg by mouth 2 (two) times daily.  . furosemide (LASIX) 20 MG tablet Take 20 mg by mouth.  . [DISCONTINUED] diltiazem (CARDIZEM) 120 MG tablet Take 120 mg by mouth daily.  . [DISCONTINUED] fluticasone (FLONASE) 50 MCG/ACT nasal spray Place 2 sprays into both nostrils daily. (Patient not taking: Reported on 05/29/2017)  . [DISCONTINUED] fluticasone (FLOVENT DISKUS) 50 MCG/BLIST diskus inhaler Inhale 1 puff into the lungs 2 (two) times daily.  . [DISCONTINUED] HYDROcodone-homatropine (HYCODAN) 5-1.5 MG/5ML syrup Take 5 mLs by mouth at bedtime as needed. (Patient not taking:  Reported on 05/27/2017)  . [DISCONTINUED] ipratropium (ATROVENT HFA) 17 MCG/ACT inhaler Inhale 2 puffs into the lungs every 6 (six) hours.  . [DISCONTINUED] ipratropium (ATROVENT) 0.02 % nebulizer solution Take 2.5 mLs (0.5 mg total) by nebulization every 6 (six) hours as needed for wheezing or shortness of breath. (Patient not taking: Reported on 05/29/2017)   No facility-administered encounter medications on file as of 05/29/2017.      Review of Systems  Constitutional:   No  weight loss, night sweats,  Fevers, chills, fatigue, or  lassitude.  HEENT:   No headaches,  Difficulty swallowing,  Tooth/dental problems, or  Sore throat,                No sneezing, itching, ear ache, nasal congestion, post nasal drip,   CV:  No chest pain, , swelling in lower extremities, anasarca, dizziness, palpitations, syncope.  + 4 pillow orthopnea   GI  No heartburn, indigestion, abdominal pain, nausea, vomiting, diarrhea, change in bowel habits, loss of appetite, bloody stools.   Resp:  .  No excess mucus, no productive cough,  No non-productive cough,  No coughing up of blood.  No change in color of mucus.  No wheezing.  No chest wall deformity  Skin: no rash or lesions.  GU: no dysuria, change in color of urine, no urgency or frequency.  No flank pain, no hematuria   MS:  No joint pain or swelling.  No decreased range of motion.  No back pain.    Physical Exam  BP 132/68 (BP Location: Left Arm, Cuff Size: Normal)   Pulse 86   Ht 5' (1.524 m)   Wt 166 lb 11.2 oz (75.6 kg)   SpO2 95%   BMI 32.56 kg/m   GEN: A/Ox3; pleasant , NAD, well nourished    HEENT:  [place]/AT,  EACs-clear, TMs-wnl, NOSE-clear, THROAT-clear, no lesions, no postnasal drip or exudate noted.   NECK:  Supple w/ fair ROM; no JVD; normal carotid impulses w/o bruits; no thyromegaly or nodules palpated; no lymphadenopathy.    RESP  Clear  P & A; w/o, wheezes/ rales/ or rhonchi. no accessory muscle use, no dullness to  percussion  CARD:  RRR, no m/r/g, no peripheral edema, pulses intact, no cyanosis or clubbing.  GI:   Soft & nt; nml bowel sounds; no organomegaly or masses detected.   Musco: Warm bil, no deformities or joint swelling noted.   Neuro: alert, no focal deficits noted.    Skin: Warm, no lesions or rashes    Lab Results:  CBC    Component Value Date/Time   WBC 9.8 03/24/2017 1709   RBC 5.07 03/24/2017 1709   HGB 14.2 03/24/2017 1709   HCT 42.9 03/24/2017 1709   PLT 274 03/24/2017 1709   MCV 85 03/24/2017 1709   MCH 28.0 03/24/2017 1709   MCHC 33.1 03/24/2017 1709  RDW 14.2 03/24/2017 1709   LYMPHSABS 4.9 (H) 03/24/2017 1709   EOSABS 0.2 03/24/2017 1709   BASOSABS 0.0 03/24/2017 1709    BMET    Component Value Date/Time   NA 142 03/24/2017 1709   K 4.2 03/24/2017 1709   CL 104 03/24/2017 1709   CO2 22 03/24/2017 1709   GLUCOSE 96 03/24/2017 1709   BUN 9 03/24/2017 1709   CREATININE 0.60 03/24/2017 1709   CALCIUM 9.5 03/24/2017 1709   GFRNONAA 93 03/24/2017 1709   GFRAA 108 03/24/2017 1709    BNP No results found for: BNP  ProBNP No results found for: PROBNP  Imaging:    Assessment & Plan:   Mild intermittent asthma without complication Appears controlled   Plan  . Patient Instructions  Continue on current regimen  Set up for 2 D echo .  Stop Ipratropium inhaler and neb .  Continue on QVAR and Spriiva .  May use ProAir 2 puffs every 4hr as needed for wheezing .  Continue on Prilosec 20mg  Twice daily  .  Restart CPAP At bedtime  , try to wear for at least 4hr .  follow up Dr. Elsworth Soho  In 3 months and As needed   Please contact office for sooner follow up if symptoms do not improve or worsen or seek emergency care    Instrucciones Continuar con el rgimen actual. Configurar para eco 2D. Detener el inhalador Ipratropium y neb. Continuar en QVAR y [place]. Puede usar ProAir 2 inhalaciones cada 4 horas segn sea necesario para  sibilancias. Continuar en Prilosec 20 mg [place]. Reinicie el CPAP A la hora de acostarse, intente usarlo durante al menos 4 horas. Seguimiento Dr. Elsworth Soho En 3 meses y segn sea necesario. Comunquese con la oficina para un seguimiento ms rpido si los sntomas no mejoran o empeoran o si buscan atencin de emergencia       Sleep apnea Restart CPAP .  DME order to check CPAP and supplies order.   Dyspnea 4 pillow orthopnea ? eiology  Check 2 D echo .  Restart CPAP .      Rexene Edison, NP 05/29/2017

## 2017-06-03 ENCOUNTER — Telehealth: Payer: Self-pay | Admitting: Pulmonary Disease

## 2017-06-03 ENCOUNTER — Ambulatory Visit (HOSPITAL_COMMUNITY): Payer: Medicare HMO | Attending: Cardiovascular Disease

## 2017-06-03 ENCOUNTER — Other Ambulatory Visit: Payer: Self-pay

## 2017-06-03 DIAGNOSIS — I10 Essential (primary) hypertension: Secondary | ICD-10-CM | POA: Insufficient documentation

## 2017-06-03 DIAGNOSIS — I371 Nonrheumatic pulmonary valve insufficiency: Secondary | ICD-10-CM | POA: Insufficient documentation

## 2017-06-03 DIAGNOSIS — G4730 Sleep apnea, unspecified: Secondary | ICD-10-CM | POA: Diagnosis not present

## 2017-06-03 DIAGNOSIS — E785 Hyperlipidemia, unspecified: Secondary | ICD-10-CM | POA: Insufficient documentation

## 2017-06-03 DIAGNOSIS — R0601 Orthopnea: Secondary | ICD-10-CM | POA: Insufficient documentation

## 2017-06-03 DIAGNOSIS — I071 Rheumatic tricuspid insufficiency: Secondary | ICD-10-CM | POA: Diagnosis not present

## 2017-06-03 NOTE — Telephone Encounter (Signed)
Spoke to pt's daughter in [place], [place]. Pt gave verbal to speak with Rose, as pt has a language barrier.  I have advised pt to add Rose to [facility] at next OV. Rose states that Qvar is not covered by insurance.  [place] and requested that PA request be faxed to our office.   Will hold message in triage until PA request is received.

## 2017-06-04 NOTE — Telephone Encounter (Signed)
Received PA request from [place]. pt's name is spelled incorrectly on PA request, therefore I am unable to start PA via CMM. Spoke to [facility] states they will be sending over PA to be completed and faxed back. Will await fax.

## 2017-06-05 ENCOUNTER — Other Ambulatory Visit: Payer: Self-pay | Admitting: Emergency Medicine

## 2017-06-05 DIAGNOSIS — E785 Hyperlipidemia, unspecified: Secondary | ICD-10-CM

## 2017-06-05 MED ORDER — SIMVASTATIN 20 MG PO TABS: 20.0000 mg | ORAL_TABLET | Freq: Every day | ORAL | 3 refills | Status: DC

## 2017-06-08 NOTE — Progress Notes (Signed)
Reviewed & agree with plan  

## 2017-06-09 NOTE — Telephone Encounter (Signed)
PA request from [facility] received for pt's Qvar.  Please note that on PA form pt's last name was not hyphenated as it is on her insurance card and in our system, and the address did not match.   However, since pt has [employer] and this form was a direct fax back to [facility] PA department, form was filled out and faxed back to [facility].  Form kept in green PA folder.  Routing back to [place] for follow-up.

## 2017-06-10 NOTE — Telephone Encounter (Signed)
[facility] [phone]. PA is still in review.

## 2017-06-11 NOTE — Progress Notes (Signed)
Made a 4th attempt to call patient via [place]: [phone], patient answered but was disconnected, interpreter called back and was able to read results to the patient.  Patient verbalized and understanding and said if she needed anything further she would call our office.   Nothing further needed at this time.

## 2017-06-11 NOTE — Telephone Encounter (Signed)
Called and spoke to [place] at [phone]. She asked about patient's failed medications and reported that we will have an answer to this PA tomorrow.

## 2017-06-11 NOTE — Telephone Encounter (Signed)
[employer] is calling back about Qvar prior authorization [phone] ref# [id number]

## 2017-06-12 NOTE — Telephone Encounter (Signed)
Dr. Elsworth Soho, Please specify which dose of Arnuity. 50, 100, 200

## 2017-06-12 NOTE — Telephone Encounter (Signed)
Okay to use Arnuity 1 puff daily

## 2017-06-12 NOTE — Telephone Encounter (Signed)
[facility] and spoke with Patty who stated the Qvar was denied and a fax was sent to our office for RA to view.  Patty stated to me Qvar was denied due to being non-formulary and the preferred inhaler was Arnuity.  Dr. Elsworth Soho, please advise if you want to switch pt over to [place] or if you want [place] to do an appeal for the Qvar. Thanks!

## 2017-06-15 MED ORDER — FLUTICASONE FUROATE 100 MCG/ACT IN AEPB: 1.0000 | INHALATION_SPRAY | Freq: Every day | RESPIRATORY_TRACT | 4 refills | Status: DC

## 2017-06-15 NOTE — Telephone Encounter (Signed)
100 once daily

## 2017-06-15 NOTE — Telephone Encounter (Signed)
Attempted to call patient, no answer, left message to call back.  Sent order for Arnuity.

## 2017-06-16 NOTE — Telephone Encounter (Signed)
Attempted to call pt but no answer. Left message for pt to return call x2 

## 2017-06-16 NOTE — Telephone Encounter (Signed)
LMTCB 06/16/17 

## 2017-06-17 ENCOUNTER — Telehealth: Payer: Self-pay | Admitting: Pulmonary Disease

## 2017-06-17 NOTE — Telephone Encounter (Signed)
Called Rose, patients daughter in [place]. Unable to reach. Left message x3 for patient to give [place] a call back. Per triage protocol will close encounter.

## 2017-06-17 NOTE — Telephone Encounter (Signed)
Attempted to call Dana Daniels I did not receive an answer. I have left a message for Dana Daniels to return our call.   **Dana Daniels is not listed on the pt's DPR.**

## 2017-06-18 NOTE — Telephone Encounter (Signed)
Spoke with Rose and advised her that Arnuity was sent in for patient since they wouldn't cover Qvar. She understood and nothing further is needed.

## 2017-06-20 ENCOUNTER — Other Ambulatory Visit: Payer: Self-pay | Admitting: *Deleted

## 2017-06-20 DIAGNOSIS — E785 Hyperlipidemia, unspecified: Secondary | ICD-10-CM

## 2017-06-24 ENCOUNTER — Other Ambulatory Visit: Payer: Self-pay | Admitting: Emergency Medicine

## 2017-06-24 MED ORDER — SIMVASTATIN 20 MG PO TABS: 20.0000 mg | ORAL_TABLET | Freq: Every day | ORAL | 3 refills | Status: DC

## 2017-06-25 ENCOUNTER — Other Ambulatory Visit: Payer: Self-pay | Admitting: *Deleted

## 2017-06-30 ENCOUNTER — Telehealth: Payer: Self-pay | Admitting: *Deleted

## 2017-06-30 ENCOUNTER — Other Ambulatory Visit: Payer: Self-pay

## 2017-06-30 ENCOUNTER — Encounter: Payer: Self-pay | Admitting: Emergency Medicine

## 2017-06-30 ENCOUNTER — Ambulatory Visit (INDEPENDENT_AMBULATORY_CARE_PROVIDER_SITE_OTHER): Payer: Medicare HMO | Admitting: Emergency Medicine

## 2017-06-30 DIAGNOSIS — J22 Unspecified acute lower respiratory infection: Secondary | ICD-10-CM

## 2017-06-30 DIAGNOSIS — E785 Hyperlipidemia, unspecified: Secondary | ICD-10-CM | POA: Diagnosis not present

## 2017-06-30 DIAGNOSIS — Z8669 Personal history of other diseases of the nervous system and sense organs: Secondary | ICD-10-CM | POA: Diagnosis not present

## 2017-06-30 DIAGNOSIS — R05 Cough: Secondary | ICD-10-CM | POA: Diagnosis not present

## 2017-06-30 DIAGNOSIS — R059 Cough, unspecified: Secondary | ICD-10-CM

## 2017-06-30 DIAGNOSIS — Z8719 Personal history of other diseases of the digestive system: Secondary | ICD-10-CM

## 2017-06-30 MED ORDER — BUTALBITAL-APAP-CAFFEINE 50-325-40 MG PO TABS: 1.0000 | ORAL_TABLET | Freq: Four times a day (QID) | ORAL | 1 refills | Status: DC | PRN

## 2017-06-30 MED ORDER — AMOXICILLIN-POT CLAVULANATE 875-125 MG PO TABS: 1.0000 | ORAL_TABLET | Freq: Two times a day (BID) | ORAL | 0 refills | Status: AC

## 2017-06-30 MED ORDER — SIMVASTATIN 20 MG PO TABS: 20.0000 mg | ORAL_TABLET | Freq: Every day | ORAL | 3 refills | Status: DC

## 2017-06-30 MED ORDER — OMEPRAZOLE 20 MG PO CPDR
20.0000 mg | DELAYED_RELEASE_CAPSULE | Freq: Every day | ORAL | 3 refills | Status: DC
Start: 2017-06-30 — End: 2017-09-25

## 2017-06-30 NOTE — Telephone Encounter (Signed)
Faxed request for Fioricet back to pharmacy, which is preferred by Dr Mitchel Honour. The patient insurance suggest other pain medications to choose from the list. Confirmation page received 4:35 pm.

## 2017-06-30 NOTE — Progress Notes (Signed)
Dana Daniels 72 y.o.   Chief Complaint  Patient presents with  . Cough    x 1 week productive green mucus  . Medication Refill    FIORICET, PRILOSEC and ZOCOR    HISTORY OF PRESENT ILLNESS: [name] complaining of one-week history of productive cough.  Has a history of COPD, hypertension, GERD, high cholesterol, and migraine headaches.  Seeing a pulmonologist. Recently seen by Rheumatologist and diagnosed with Osteoarthritis.  HPI   Prior to Admission medications   Medication Sig Start Date End Date Taking? Authorizing Provider  albuterol (PROVENTIL HFA;VENTOLIN HFA) 108 (90 Base) MCG/ACT inhaler Inhale 2 puffs into the lungs every 4 (four) hours as needed for wheezing or shortness of breath. 05/29/17  Yes Parrett, Tammy S, NP  butalbital-acetaminophen-caffeine (ESGIC) 50-325-40 MG tablet Take by mouth 2 (two) times daily as needed for headache.   Yes [provider]  cetirizine (ZYRTEC) 10 MG tablet Take 1 tablet (10 mg total) by mouth daily. 04/16/16  Yes Jaynee Eagles, PA-C  Cholecalciferol (VITAMIN D3) 10000 units TABS Take by mouth.   Yes [provider]  Fluticasone Furoate (ARNUITY ELLIPTA) 100 MCG/ACT AEPB Inhale 1 puff into the lungs daily. 06/15/17  Yes Rigoberto Noel, MD  montelukast (SINGULAIR) 10 MG tablet Take 1 tablet (10 mg total) by mouth at bedtime. 05/22/17 08/20/17 Yes Amisha Pospisil, Ines Bloomer, MD  nitroGLYCERIN (NITROSTAT) 0.4 MG SL tablet Place 0.4 mg under the tongue every 5 (five) minutes as needed for chest pain.   Yes [provider]  omeprazole (PRILOSEC) 20 MG capsule Take 1 capsule (20 mg total) by mouth daily. 05/22/17 08/20/17 Yes Gevon Markus, Ines Bloomer, MD  simvastatin (ZOCOR) 20 MG tablet Take 1 tablet (20 mg total) by mouth daily. Office visit needed for 90 day supply 06/24/17 09/22/17 Yes Jeniya Flannigan, Ines Bloomer, MD  tiotropium [facility]) 18 MCG inhalation capsule Place 1 capsule (18 mcg total) into inhaler and inhale  daily. 05/13/17  Yes Rigoberto Noel, MD  amLODipine (NORVASC) 5 MG tablet Take 5 mg by mouth 2 (two) times daily.    [provider]  diltiazem (CARDIZEM CD) 240 MG 24 hr capsule Take 1 capsule (240 mg total) by mouth daily. 03/24/17 06/22/17  Horald Pollen, MD  furosemide (LASIX) 20 MG tablet Take 20 mg by mouth.    [provider]    Allergies  Allergen Reactions  . Tramadol Itching    Patient Active Problem List   Diagnosis Date Noted  . Dyspnea 05/29/2017  . Osteoarthritis of multiple joints 03/24/2017  . Chronic pain of left knee 03/24/2017  . Chronic obstructive pulmonary disease ([place]) 03/24/2017  . Primary cancer of skin of left foot 02/03/2017  . Mild intermittent asthma without complication 94/76/5465  . Sleep apnea 02/03/2017  . Dyslipidemia 02/03/2017  . Migraine 02/03/2017  . Essential hypertension 09/18/2008  . ALLERGIC RHINITIS 09/18/2008  . ASTHMA 09/18/2008  . COUGH 09/18/2008    Past Medical History:  Diagnosis Date  . Allergy   . Arthritis   . Asthma   . Blood transfusion without reported diagnosis   . Cataract   . Hyperlipidemia   . Hypertension   . Migraine   . Osteoporosis   . Sleep apnea     Past Surgical History:  Procedure Laterality Date  . ABDOMINAL HYSTERECTOMY    . CHOLECYSTECTOMY    . EYE SURGERY Left    cataract   . FOOT SURGERY Left  Social History   Socioeconomic History  . Marital status: Widowed    Spouse name: Not on file  . Number of children: Not on file  . Years of education: Not on file  . Highest education level: Not on file  Occupational History  . Not on file  Social Needs  . Financial resource strain: Not on file  . Food insecurity:    Worry: Not on file    Inability: Not on file  . Transportation needs:    Medical: Not on file    Non-medical: Not on file  Tobacco Use  . Smoking status: Never Smoker  . Smokeless tobacco: Never Used  Substance and Sexual Activity  . Alcohol  use: No  . Drug use: Never  . Sexual activity: Not on file  Lifestyle  . Physical activity:    Days per week: Not on file    Minutes per session: Not on file  . Stress: Not on file  Relationships  . Social connections:    Talks on phone: Not on file    Gets together: Not on file    Attends religious service: Not on file    Active member of club or organization: Not on file    Attends meetings of clubs or organizations: Not on file    Relationship status: Not on file  . Intimate partner violence:    Fear of current or ex partner: Not on file    Emotionally abused: Not on file    Physically abused: Not on file    Forced sexual activity: Not on file  Other Topics Concern  . Not on file  Social History Narrative  . Not on file    Family History  Problem Relation Age of Onset  . Heart disease Mother   . Hyperlipidemia Mother   . Hypertension Mother   . Stroke Mother   . Heart disease Father   . Stroke Father   . Hypertension Son   . Hyperlipidemia Son   . Fibromyalgia Son   . Lupus Son   . Hypertension Son   . Diabetes Daughter   . Hypertension Daughter      Review of Systems  Constitutional: Negative.  Negative for chills and fever.  HENT: Negative.   Eyes: Negative.  Negative for discharge and redness.  Respiratory: Positive for cough, sputum production and shortness of breath. Negative for hemoptysis and wheezing.   Cardiovascular: Negative.  Negative for chest pain and palpitations.  Gastrointestinal: Negative.  Negative for abdominal pain, blood in stool, diarrhea, nausea and vomiting.  Genitourinary: Negative.  Negative for dysuria and hematuria.  Musculoskeletal: Negative for joint pain and myalgias.  Skin: Negative.  Negative for rash.  Neurological: Negative.  Negative for dizziness and headaches.  Endo/Heme/Allergies: Negative.   All other systems reviewed and are negative.    Physical Exam  Constitutional: She is oriented to person, place, and time.  She appears well-developed and well-nourished.  HENT:  Head: Normocephalic and atraumatic.  Nose: Nose normal.  Mouth/Throat: Oropharynx is clear and moist.  Eyes: Pupils are equal, round, and reactive to light. Conjunctivae and EOM are normal.  Neck: Normal range of motion. Neck supple. No JVD present. No thyromegaly present.  Cardiovascular: Normal rate, regular rhythm and normal heart sounds.  Pulmonary/Chest: Effort normal and breath sounds normal. No respiratory distress. She has no wheezes. She has no rales.  Musculoskeletal: Normal range of motion.  +chronic osteoarthritic changes to fingers  Lymphadenopathy:    She has  no cervical adenopathy.  Neurological: She is alert and oriented to person, place, and time. No sensory deficit. She exhibits normal muscle tone.  Skin: Skin is warm and dry. Capillary refill takes less than 2 seconds.  Psychiatric: She has a normal mood and affect. Her behavior is normal.  Vitals reviewed.    ASSESSMENT & PLAN: Dana Daniels was seen today for cough and medication refill.  Diagnoses and all orders for this visit:  Cough  Lower respiratory infection -     amoxicillin-clavulanate (AUGMENTIN) 875-125 MG tablet; Take 1 tablet by mouth 2 (two) times daily for 7 days.  Dyslipidemia -     simvastatin (ZOCOR) 20 MG tablet; Take 1 tablet (20 mg total) by mouth daily. Office visit needed for 90 day supply  History of migraine headaches -     butalbital-acetaminophen-caffeine (ESGIC) 50-325-40 MG tablet; Take 1 tablet by mouth every 6 (six) hours as needed for headache or migraine.  History of gastroesophageal reflux (GERD) -     omeprazole (PRILOSEC) 20 MG capsule; Take 1 capsule (20 mg total) by mouth daily.    Patient Instructions       IF you received an x-ray today, you will receive an invoice from [facility]. Please contact [facility] at [phone] with questions or concerns regarding your invoice.   IF you received  labwork today, you will receive an invoice from [facility]. Please contact [facility] at [phone] with questions or concerns regarding your invoice.   Our billing staff will not be able to assist you with questions regarding bills from these companies.  You will be contacted with the lab results as soon as they are available. The fastest way to get your results is to activate your My Chart account. Instructions are located on the last page of this paperwork. If you have not heard from [place] regarding the results in 2 weeks, please contact this office.     Cough, Adult A cough helps to clear your throat and lungs. A cough may last only 2-3 weeks (acute), or it may last longer than 8 weeks (chronic). Many different things can cause a cough. A cough may be a sign of an illness or another medical condition. Follow these instructions at home:  Pay attention to any changes in your cough.  Take medicines only as told by your doctor. ? If you were prescribed an antibiotic medicine, take it as told by your doctor. Do not stop taking it even if you start to feel better. ? Talk with your doctor before you try using a cough medicine.  Drink enough fluid to keep your pee (urine) clear or pale yellow.  If the air is dry, use a cold steam vaporizer or humidifier in your home.  Stay away from things that make you cough at work or at home.  If your cough is worse at night, try using extra pillows to raise your head up higher while you sleep.  Do not smoke, and try not to be around smoke. If you need help quitting, ask your doctor.  Do not have caffeine.  Do not drink alcohol.  Rest as needed. Contact a doctor if:  You have new problems (symptoms).  You cough up yellow fluid (pus).  Your cough does not get better after 2-3 weeks, or your cough gets worse.  Medicine does not help your cough and you are not sleeping well.  You have pain that gets worse or pain that is not helped with  medicine.  You have a fever.  You are losing weight and you do not know why.  You have night sweats. Get help right away if:  You cough up blood.  You have trouble breathing.  Your heartbeat is very fast. This information is not intended to replace advice given to you by your health care provider. Make sure you discuss any questions you have with your health care provider. Document Released: 10/31/2010 Document Revised: 07/26/2015 Document Reviewed: 04/26/2014 Elsevier Interactive Patient Education  2018 Elsevier Inc.     Agustina Caroli, MD [facility]

## 2017-06-30 NOTE — Patient Instructions (Addendum)
     IF you received an x-ray today, you will receive an invoice from [facility]. Please contact [facility] at [phone] with questions or concerns regarding your invoice.   IF you received labwork today, you will receive an invoice from [facility]. Please contact [facility] at [phone] with questions or concerns regarding your invoice.   Our billing staff will not be able to assist you with questions regarding bills from these companies.  You will be contacted with the lab results as soon as they are available. The fastest way to get your results is to activate your My Chart account. Instructions are located on the last page of this paperwork. If you have not heard from us regarding the results in 2 weeks, please contact this office.     Cough, Adult A cough helps to clear your throat and lungs. A cough may last only 2-3 weeks (acute), or it may last longer than 8 weeks (chronic). Many different things can cause a cough. A cough may be a sign of an illness or another medical condition. Follow these instructions at home:  Pay attention to any changes in your cough.  Take medicines only as told by your doctor. ? If you were prescribed an antibiotic medicine, take it as told by your doctor. Do not stop taking it even if you start to feel better. ? Talk with your doctor before you try using a cough medicine.  Drink enough fluid to keep your pee (urine) clear or pale yellow.  If the air is dry, use a cold steam vaporizer or humidifier in your home.  Stay away from things that make you cough at work or at home.  If your cough is worse at night, try using extra pillows to raise your head up higher while you sleep.  Do not smoke, and try not to be around smoke. If you need help quitting, ask your doctor.  Do not have caffeine.  Do not drink alcohol.  Rest as needed. Contact a doctor if:  You have new problems (symptoms).  You cough up yellow fluid  (pus).  Your cough does not get better after 2-3 weeks, or your cough gets worse.  Medicine does not help your cough and you are not sleeping well.  You have pain that gets worse or pain that is not helped with medicine.  You have a fever.  You are losing weight and you do not know why.  You have night sweats. Get help right away if:  You cough up blood.  You have trouble breathing.  Your heartbeat is very fast. This information is not intended to replace advice given to you by your health care provider. Make sure you discuss any questions you have with your health care provider. Document Released: 10/31/2010 Document Revised: 07/26/2015 Document Reviewed: 04/26/2014 Elsevier Interactive Patient Education  2018 Elsevier Inc.  

## 2017-07-01 ENCOUNTER — Ambulatory Visit: Payer: Medicare HMO | Admitting: Rheumatology

## 2017-07-01 ENCOUNTER — Telehealth: Payer: Self-pay | Admitting: *Deleted

## 2017-07-01 NOTE — Telephone Encounter (Signed)
Faxed clarification from to [facility] to dispense Simvastatin medication. Confirmation page received at 6:12 pm.

## 2017-07-20 ENCOUNTER — Encounter: Payer: Self-pay | Admitting: Adult Health

## 2017-07-20 DIAGNOSIS — G4733 Obstructive sleep apnea (adult) (pediatric): Secondary | ICD-10-CM

## 2017-07-20 NOTE — Telephone Encounter (Signed)
Dr Elsworth Soho, can we order a new CPAP machine?

## 2017-07-20 NOTE — Telephone Encounter (Signed)
Received a mychart message from pt stating she is needing a new cpap machine because her current one is a renter from [place].  Pt saw TP 05/29/17 and an order was placed for pt to be established with a new DME.  Per Judeen Hammans, [facility] and [facility], due to pt recently coming from [place] to [facility], the DME will need to receive face-to-face encounter visit and pt will need to have a new sleep study as they are unable to accept sleep study information from [place].  Tammy, please advise if you are fine with [place] ordering a home sleep study for pt before pt can receive a new cpap machine.  Pt does have a f/u appt scheduled with Dr. Elsworth Soho in June if this message needs to be addressed by him.   Thanks!

## 2017-07-22 NOTE — Telephone Encounter (Signed)
Per TP: yes, okay to order HST.  Thank you.

## 2017-07-22 NOTE — Telephone Encounter (Signed)
HST order placed, e-mail sent to patient to make her aware

## 2017-08-19 ENCOUNTER — Ambulatory Visit (INDEPENDENT_AMBULATORY_CARE_PROVIDER_SITE_OTHER): Payer: Medicare HMO | Admitting: Pulmonary Disease

## 2017-08-19 ENCOUNTER — Encounter: Payer: Self-pay | Admitting: Pulmonary Disease

## 2017-08-19 DIAGNOSIS — G4733 Obstructive sleep apnea (adult) (pediatric): Secondary | ICD-10-CM | POA: Diagnosis not present

## 2017-08-19 DIAGNOSIS — J4521 Mild intermittent asthma with (acute) exacerbation: Secondary | ICD-10-CM

## 2017-08-19 MED ORDER — FLUTICASONE FUROATE-VILANTEROL 100-25 MCG/INH IN AEPB: 1.0000 | INHALATION_SPRAY | Freq: Every day | RESPIRATORY_TRACT | 0 refills | Status: DC

## 2017-08-19 MED ORDER — AZITHROMYCIN 250 MG PO TABS: ORAL_TABLET | ORAL | 0 refills | Status: DC

## 2017-08-19 NOTE — Addendum Note (Signed)
Addended by: Valerie Salts on: 08/19/2017 09:53 AM   Modules accepted: Orders

## 2017-08-19 NOTE — Progress Notes (Signed)
   Subjective:    Patient ID: Dana Daniels, female    DOB: 27-Dec-1945, 72 y.o.   MRN: [id number]  HPI  Chief Complaint  Patient presents with  . Follow-up    3 month follow up. Cough has increased since last visit. Cough is not productive during the day, but at night, it turns dark green.     72 year old [place] woman for follow-up of asthma and OSA. She establish care in 04/2017.  Accompanied by son and daughter-in-law Asthma was difficult onset and was controlled on regimen of inhaled steroid and Spiriva.  Due to insurance reasons we changed from Qvar to Flovent. She had a chest cold with sinus congestion, other family members were sick too.  She is coughing up green sputum and has intermittent wheezing, requiring more frequent use of albuterol.  She does report that Spiriva has helped improve symptoms.  She was set up with DME for CPAP supplies but download is not available today   Significant tests/ events reviewed  Spirometry 04/2017 >> poor effort but showed moderate restriction with ratio of 80 and FEV1 of 65% and FVC of 61%  Review of Systems neg for any significant sore throat, dysphagia, itching, sneezing, nasal congestion or excess/ purulent secretions, fever, chills, sweats, unintended wt loss, pleuritic or exertional cp, hempoptysis, orthopnea pnd or change in chronic leg swelling.   Also denies presyncope, palpitations, heartburn, abdominal pain, nausea, vomiting, diarrhea or change in bowel or urinary habits, dysuria,hematuria, rash, arthralgias, visual complaints, headache, numbness weakness or ataxia.     Objective:   Physical Exam  Gen. Pleasant, well-nourished, in no distress ENT - no thrush, no post nasal drip Neck: No JVD, no thyromegaly, no carotid bruits Lungs: no use of accessory muscles, no dullness to percussion, clear without rales or rhonchi  Cardiovascular: Rhythm regular, heart sounds  normal, no murmurs or gallops, no peripheral  edema Musculoskeletal: No deformities, no cyanosis or clubbing        Assessment & Plan:

## 2017-08-19 NOTE — Assessment & Plan Note (Signed)
Obtain CPAP report from DME

## 2017-08-19 NOTE — Addendum Note (Signed)
Addended by: Valerie Salts on: 08/19/2017 10:03 AM   Modules accepted: Orders

## 2017-08-19 NOTE — Assessment & Plan Note (Signed)
Zpak -we will treat for acute bronchitis  If cough persists, OK to take DELSYM 20ml twice daily as needed  Trial of Breo 100 once daily instead of Flovent. Call us for prescription if this works and is covered by her insurance  In the future we will try and take you off Spiriva if symptoms remain well controlled

## 2017-08-19 NOTE — Patient Instructions (Signed)
Zpak  If cough persists, OK to take DELSYM 32ml twice daily as needed  Trial of Breo 100 once daily instead of Flovent. Call us for prescription if this works and is covered by her insurance  In the future we will try and take you off Spiriva if symptoms remain well controlled

## 2017-09-07 DIAGNOSIS — G4733 Obstructive sleep apnea (adult) (pediatric): Secondary | ICD-10-CM | POA: Diagnosis not present

## 2017-09-10 ENCOUNTER — Encounter: Payer: Self-pay | Admitting: Pulmonary Disease

## 2017-09-10 ENCOUNTER — Other Ambulatory Visit: Payer: Self-pay | Admitting: *Deleted

## 2017-09-10 DIAGNOSIS — G4733 Obstructive sleep apnea (adult) (pediatric): Secondary | ICD-10-CM

## 2017-09-10 MED ORDER — FLUTICASONE FUROATE-VILANTEROL 100-25 MCG/INH IN AEPB: 1.0000 | INHALATION_SPRAY | Freq: Every day | RESPIRATORY_TRACT | 0 refills | Status: DC

## 2017-09-14 DIAGNOSIS — G4733 Obstructive sleep apnea (adult) (pediatric): Secondary | ICD-10-CM | POA: Diagnosis not present

## 2017-09-25 ENCOUNTER — Telehealth: Payer: Self-pay | Admitting: Family Medicine

## 2017-09-25 ENCOUNTER — Other Ambulatory Visit: Payer: Self-pay

## 2017-09-25 ENCOUNTER — Ambulatory Visit (INDEPENDENT_AMBULATORY_CARE_PROVIDER_SITE_OTHER): Payer: Medicare HMO | Admitting: Emergency Medicine

## 2017-09-25 ENCOUNTER — Encounter: Payer: Self-pay | Admitting: Emergency Medicine

## 2017-09-25 VITALS — BP 125/71 | HR 93 | Temp 99.3°F | Resp 16 | Ht 60.5 in | Wt 161.4 lb

## 2017-09-25 DIAGNOSIS — R519 Headache, unspecified: Secondary | ICD-10-CM

## 2017-09-25 DIAGNOSIS — I10 Essential (primary) hypertension: Secondary | ICD-10-CM

## 2017-09-25 DIAGNOSIS — Z8719 Personal history of other diseases of the digestive system: Secondary | ICD-10-CM

## 2017-09-25 DIAGNOSIS — R51 Headache: Secondary | ICD-10-CM

## 2017-09-25 DIAGNOSIS — B029 Zoster without complications: Secondary | ICD-10-CM | POA: Diagnosis not present

## 2017-09-25 DIAGNOSIS — E785 Hyperlipidemia, unspecified: Secondary | ICD-10-CM | POA: Diagnosis not present

## 2017-09-25 MED ORDER — SIMVASTATIN 20 MG PO TABS: 20.0000 mg | ORAL_TABLET | Freq: Every day | ORAL | 3 refills | Status: DC

## 2017-09-25 MED ORDER — HYDROCODONE-ACETAMINOPHEN 5-325 MG PO TABS: 1.0000 | ORAL_TABLET | Freq: Four times a day (QID) | ORAL | 0 refills | Status: DC | PRN

## 2017-09-25 MED ORDER — DILTIAZEM HCL ER COATED BEADS 240 MG PO CP24: 240.0000 mg | ORAL_CAPSULE | Freq: Every day | ORAL | 3 refills | Status: DC

## 2017-09-25 MED ORDER — MONTELUKAST SODIUM 10 MG PO TABS: 10.0000 mg | ORAL_TABLET | Freq: Every day | ORAL | 3 refills | Status: DC

## 2017-09-25 MED ORDER — OMEPRAZOLE 20 MG PO CPDR: 20.0000 mg | DELAYED_RELEASE_CAPSULE | Freq: Every day | ORAL | 3 refills | Status: DC

## 2017-09-25 MED ORDER — VALACYCLOVIR HCL 1 G PO TABS
1000.0000 mg | ORAL_TABLET | Freq: Two times a day (BID) | ORAL | 0 refills | Status: DC
Start: 2017-09-25 — End: 2021-12-19

## 2017-09-25 NOTE — Progress Notes (Signed)
Dana Daniels 72 y.o.   Chief Complaint  Patient presents with  . Rash    per patient on the right side of face  and ear x 3 days    HISTORY OF PRESENT ILLNESS: [name] complaining of pain to area around the right ear that started 3 days ago followed by painful rash on the right side of the forehead into the scalp.  No other significant symptomatology.  Pain is sharp, constant, with no radiation, no other symptoms. Also has a history of hypertension and needs medication refills. HPI   Prior to Admission medications   Medication Sig Start Date End Date Taking? Authorizing Provider  albuterol (PROVENTIL HFA;VENTOLIN HFA) 108 (90 Base) MCG/ACT inhaler Inhale 2 puffs into the lungs every 4 (four) hours as needed for wheezing or shortness of breath. 05/29/17  Yes Parrett, Tammy S, NP  Cholecalciferol (VITAMIN D3) 10000 units TABS Take by mouth.   Yes [provider]  fluticasone furoate-vilanterol (BREO ELLIPTA) 100-25 MCG/INH AEPB Inhale 1 puff into the lungs daily. 08/19/17  Yes Rigoberto Noel, MD  nitroGLYCERIN (NITROSTAT) 0.4 MG SL tablet Place 0.4 mg under the tongue every 5 (five) minutes as needed for chest pain.   Yes [provider]  omeprazole (PRILOSEC) 20 MG capsule Take 1 capsule (20 mg total) by mouth daily. 09/25/17 12/24/17 Yes Elizah Lydon, Ines Bloomer, MD  simvastatin (ZOCOR) 20 MG tablet Take 1 tablet (20 mg total) by mouth daily. Office visit needed for 90 day supply 09/25/17 12/24/17 Yes Maudean Hoffmann, Ines Bloomer, MD  azithromycin (ZITHROMAX) 250 MG tablet Take 2 tablets on first day, then 1 tablet daily until finished. Patient not taking: Reported on 09/25/2017 08/19/17   Rigoberto Noel, MD  cetirizine (ZYRTEC) 10 MG tablet Take 1 tablet (10 mg total) by mouth daily. Patient not taking: Reported on 09/25/2017 04/16/16   Jaynee Eagles, PA-C  diltiazem (CARDIZEM CD) 240 MG 24 hr capsule Take 1 capsule (240 mg total) by mouth daily. 09/25/17  12/24/17  Horald Pollen, MD  Fluticasone Furoate (ARNUITY ELLIPTA) 100 MCG/ACT AEPB Inhale 1 puff into the lungs daily. Patient not taking: Reported on 09/25/2017 06/15/17   Rigoberto Noel, MD  fluticasone furoate-vilanterol (BREO ELLIPTA) 100-25 MCG/INH AEPB Inhale 1 puff into the lungs daily. 09/10/17   Rigoberto Noel, MD  HYDROcodone-acetaminophen (NORCO) 5-325 MG tablet Take 1 tablet by mouth every 6 (six) hours as needed for moderate pain. 09/25/17   Horald Pollen, MD  montelukast (SINGULAIR) 10 MG tablet Take 1 tablet (10 mg total) by mouth at bedtime. 09/25/17 12/24/17  Horald Pollen, MD  tiotropium (SPIRIVA) 18 MCG inhalation capsule Place 1 capsule (18 mcg total) into inhaler and inhale daily. Patient not taking: Reported on 09/25/2017 05/13/17   Rigoberto Noel, MD  valACYclovir (VALTREX) 1000 MG tablet Take 1 tablet (1,000 mg total) by mouth 2 (two) times daily. 09/25/17   Horald Pollen, MD    Allergies  Allergen Reactions  . Tramadol Itching    Patient Active Problem List   Diagnosis Date Noted  . History of gastroesophageal reflux (GERD) 09/25/2017  . Herpes zoster without complication 40/98/1191  . Facial pain 09/25/2017  . Dyspnea 05/29/2017  . Osteoarthritis of multiple joints 03/24/2017  . Chronic pain of left knee 03/24/2017  . Chronic obstructive pulmonary disease ([place]) 03/24/2017  . Primary cancer of skin of left foot 02/03/2017  . Mild intermittent asthma without complication 47/82/9562  . Sleep  apnea 02/03/2017  . Dyslipidemia 02/03/2017  . Migraine 02/03/2017  . Essential hypertension 09/18/2008  . ALLERGIC RHINITIS 09/18/2008  . Mild intermittent asthma with acute exacerbation 09/18/2008  . COUGH 09/18/2008    Past Medical History:  Diagnosis Date  . Allergy   . Arthritis   . Asthma   . Blood transfusion without reported diagnosis   . Cataract   . Hyperlipidemia   . Hypertension   . Migraine   . Osteoporosis   . Sleep apnea      Past Surgical History:  Procedure Laterality Date  . ABDOMINAL HYSTERECTOMY    . CHOLECYSTECTOMY    . EYE SURGERY Left    cataract   . FOOT SURGERY Left     Social History   Socioeconomic History  . Marital status: Widowed    Spouse name: Not on file  . Number of children: Not on file  . Years of education: Not on file  . Highest education level: Not on file  Occupational History  . Not on file  Social Needs  . Financial resource strain: Not on file  . Food insecurity:    Worry: Not on file    Inability: Not on file  . Transportation needs:    Medical: Not on file    Non-medical: Not on file  Tobacco Use  . Smoking status: Never Smoker  . Smokeless tobacco: Never Used  Substance and Sexual Activity  . Alcohol use: No  . Drug use: Never  . Sexual activity: Not on file  Lifestyle  . Physical activity:    Days per week: Not on file    Minutes per session: Not on file  . Stress: Not on file  Relationships  . Social connections:    Talks on phone: Not on file    Gets together: Not on file    Attends religious service: Not on file    Active member of club or organization: Not on file    Attends meetings of clubs or organizations: Not on file    Relationship status: Not on file  . Intimate partner violence:    Fear of current or ex partner: Not on file    Emotionally abused: Not on file    Physically abused: Not on file    Forced sexual activity: Not on file  Other Topics Concern  . Not on file  Social History Narrative  . Not on file    Family History  Problem Relation Age of Onset  . Heart disease Mother   . Hyperlipidemia Mother   . Hypertension Mother   . Stroke Mother   . Heart disease Father   . Stroke Father   . Hypertension Son   . Hyperlipidemia Son   . Fibromyalgia Son   . Lupus Son   . Hypertension Son   . Diabetes Daughter   . Hypertension Daughter      Review of Systems  Constitutional: Negative.  Negative for chills and fever.   HENT: Negative.  Negative for hearing loss and sore throat.   Eyes: Negative.  Negative for blurred vision and double vision.  Respiratory: Negative.  Negative for cough and shortness of breath.   Cardiovascular: Negative.  Negative for chest pain and palpitations.  Gastrointestinal: Negative.  Negative for abdominal pain, nausea and vomiting.  Genitourinary: Negative.  Negative for dysuria and hematuria.  Musculoskeletal: Negative.  Negative for back pain, myalgias and neck pain.  Skin: Positive for rash.  Neurological: Negative.  Negative for  dizziness and headaches.  Endo/Heme/Allergies: Negative.   All other systems reviewed and are negative.   Vitals:   09/25/17 1722  BP: 125/71  Pulse: 93  Resp: 16  Temp: 99.3 F (37.4 C)  SpO2: 93%    Physical Exam  Constitutional: She is oriented to person, place, and time. She appears well-developed and well-nourished.  HENT:  Head: Normocephalic and atraumatic.  Right Ear: External ear normal.  Left Ear: External ear normal.  Nose: Nose normal.  Mouth/Throat: Oropharynx is clear and moist. No oropharyngeal exudate.  Eyes: Pupils are equal, round, and reactive to light. Conjunctivae and EOM are normal.  Neck: Normal range of motion. Neck supple. No JVD present.  Cardiovascular: Normal rate, regular rhythm and normal heart sounds.  Pulmonary/Chest: Effort normal and breath sounds normal.  Musculoskeletal: Normal range of motion. She exhibits no edema.  Lymphadenopathy:    She has no cervical adenopathy.  Neurological: She is alert and oriented to person, place, and time. No sensory deficit. She exhibits normal muscle tone.  Skin: Skin is warm and dry. Capillary refill takes less than 2 seconds.  Positive shingles rash to right forehead into the scalp and around right ear.  No eye or nose involvement.  Psychiatric: She has a normal mood and affect. Her behavior is normal.  Vitals reviewed.    ASSESSMENT & PLAN: Athea was  seen today for rash.  Diagnoses and all orders for this visit:  Herpes zoster without complication -     valACYclovir (VALTREX) 1000 MG tablet; Take 1 tablet (1,000 mg total) by mouth 2 (two) times daily.  Essential hypertension -     diltiazem (CARDIZEM CD) 240 MG 24 hr capsule; Take 1 capsule (240 mg total) by mouth daily.  Dyslipidemia -     simvastatin (ZOCOR) 20 MG tablet; Take 1 tablet (20 mg total) by mouth daily. Office visit needed for 90 day supply  History of gastroesophageal reflux (GERD) -     omeprazole (PRILOSEC) 20 MG capsule; Take 1 capsule (20 mg total) by mouth daily.  Facial pain -     HYDROcodone-acetaminophen (NORCO) 5-325 MG tablet; Take 1 tablet by mouth every 6 (six) hours as needed for moderate pain.  Other orders -     montelukast (SINGULAIR) 10 MG tablet; Take 1 tablet (10 mg total) by mouth at bedtime.    Patient Instructions       IF you received an x-ray today, you will receive an invoice from [facility]. Please contact [facility] at [phone] with questions or concerns regarding your invoice.   IF you received labwork today, you will receive an invoice from [place]. Please contact [facility] at [phone] with questions or concerns regarding your invoice.   Our billing staff will not be able to assist you with questions regarding bills from these companies.  You will be contacted with the lab results as soon as they are available. The fastest way to get your results is to activate your My Chart account. Instructions are located on the last page of this paperwork. If you have not heard from [place] regarding the results in 2 weeks, please contact this office.     Culebrilla (Shingles) La culebrilla es una infeccin que causa una erupcin dolorosa en la piel y ampollas llenas de lquido. La culebrilla est causada por el mismo virus que causa la varicela. Solo se manifiesta en personas que:  Tuvieron varicela.  Recibieron  la vacuna contra la varicela. (Esto es poco frecuente). Los  primeros sntomas de la culebrilla pueden ser picazn, hormigueo o dolor en una zona de la piel. Luego de [employer] o semanas aparecer una erupcin cutnea. La erupcin suele presentarse en un solo lado del cuerpo en un patrn con forma de cinto o de banda. Con el tiempo, la erupcin se convierte en ampollas llenas de lquido que se abren, forman costras y se secan. Los medicamentos pueden [employer] siguientes efectos:  Ayudar a Financial controller.  Lograr una recuperacin ms rpida.  Prevenir problemas a Barrister's clerk. [place] los medicamentos solamente como se lo haya indicado el mdico.  Aplique una crema para calmar la picazn o con anestesia en la zona afectada, como se lo haya indicado el mdico. Cuidado de la erupcin y las ampollas  Tome un bao de agua fra o aplique compresas fras en la zona de la erupcin o las ampollas como se lo haya indicado el mdico. Esto Best boy y Cabin crew.  Mantenga la zona de la erupcin cubierta con una venda (vendaje). Use ropas sueltas.  Mantenga limpia la zona de la erupcin y las ampollas con [place] y agua fra, o como se lo haya indicado el mdico.  Controle la erupcin todos los das para detectar signos de infeccin. Estos signos incluyen enrojecimiento, hinchazn o dolor que perduran o empeoran.  No pellizque las ampollas.  No se rasque la zona de la erupcin. Instrucciones generales  Haga reposo tal como le indic el mdico.  Concurra a todas las visitas de control como se lo haya indicado el mdico. Esto es importante.  Hasta tanto las ampollas formen costras, la infeccin puede causar varicela en las personas que nunca la tuvieron o no se vacunaron contra la varicela. Para impedir que esto sucede, evite tocar a Producer, television/film/video o estar cerca de otras personas, en especial: ? Bebs. ? Embarazadas. ? Nios que Theatre stage manager. ? Personas mayores que han recibido un trasplante. ? Personas con enfermedades crnicas, como leucemia y sida. SOLICITE AYUDA SI:  El dolor no mejora con medicamentos.  El dolor no mejora despus de que la erupcin desaparece.  La erupcin parece infectada. Los signos de infeccin son los siguiente: ? Enrojecimiento. ? Hinchazn. ? Dolor que perdura o [place]. SOLICITE AYUDA DE INMEDIATO SI:  La erupcin aparece en el rostro o la nariz.  Tiene dolor en el rostro, en la zona de los ojos o prdida de la sensibilidad en un lado del rostro.  Siente dolor o un zumbido en el odo.  Tiene prdida del gusto.  La afeccin empeora. Esta informacin no tiene Marine scientist el consejo del mdico. Asegrese de hacerle al mdico cualquier pregunta que tenga. Document Released: 05/16/2008 Document Revised: 06/11/2015 Document Reviewed: 11/29/2013 Elsevier Interactive Patient Education  2018 Elsevier Inc.       Agustina Caroli, MD [facility]

## 2017-09-25 NOTE — Telephone Encounter (Signed)
Received after service call 8:30 pm - pharmacy closes at 9. Seen in the office today, rx'd acyclovir and hydrocodone. They showed AVS to pharmacist at [facility] and he got all meds except for hydrocodone. Appears that it was e-rx'd but didn't go thru. Advised tele-nurse to inform pt that will resend so pt should check w/ their pharmacy tomorrow morning.    Please call pt to ensure that they were able to pick up the hydrocodone after I resent it.

## 2017-09-25 NOTE — Patient Instructions (Addendum)
IF you received an x-ray today, you will receive an invoice from [facility]. Please contact [facility] at [phone] with questions or concerns regarding your invoice.   IF you received labwork today, you will receive an invoice from [place]. Please contact [facility] at [phone] with questions or concerns regarding your invoice.   Our billing staff will not be able to assist you with questions regarding bills from these companies.  You will be contacted with the lab results as soon as they are available. The fastest way to get your results is to activate your My Chart account. Instructions are located on the last page of this paperwork. If you have not heard from [place] regarding the results in 2 weeks, please contact this office.     Culebrilla (Shingles) La culebrilla es una infeccin que causa una erupcin dolorosa en la piel y ampollas llenas de lquido. La culebrilla est causada por el mismo virus que causa la varicela. Solo se manifiesta en personas que:  Tuvieron varicela.  Recibieron la vacuna contra la varicela. (Esto es poco frecuente). Los primeros sntomas de la culebrilla pueden ser picazn, hormigueo o dolor en una zona de la piel. Luego de [employer] o semanas aparecer una erupcin cutnea. La erupcin suele presentarse en un solo lado del cuerpo en un patrn con forma de cinto o de banda. Con el tiempo, la erupcin se convierte en ampollas llenas de lquido que se abren, forman costras y se secan. Los medicamentos pueden [employer] siguientes efectos:  Ayudar a Financial controller.  Lograr una recuperacin ms rpida.  Prevenir problemas a Barrister's clerk. [place] los medicamentos solamente como se lo haya indicado el mdico.  Aplique una crema para calmar la picazn o con anestesia en la zona afectada, como se lo haya indicado el mdico. Cuidado de la erupcin y las ampollas  Tome un bao de agua fra o aplique  compresas fras en la zona de la erupcin o las ampollas como se lo haya indicado el mdico. Esto Best boy y Cabin crew.  Mantenga la zona de la erupcin cubierta con una venda (vendaje). Use ropas sueltas.  Mantenga limpia la zona de la erupcin y las ampollas con [place] y agua fra, o como se lo haya indicado el mdico.  Controle la erupcin todos los das para detectar signos de infeccin. Estos signos incluyen enrojecimiento, hinchazn o dolor que perduran o empeoran.  No pellizque las ampollas.  No se rasque la zona de la erupcin. Instrucciones generales  Haga reposo tal como le indic el mdico.  Concurra a todas las visitas de control como se lo haya indicado el mdico. Esto es importante.  Hasta tanto las ampollas formen costras, la infeccin puede causar varicela en las personas que nunca la tuvieron o no se vacunaron contra la varicela. Para impedir que esto sucede, evite tocar a Producer, television/film/video o estar cerca de otras personas, en especial: ? Bebs. ? Embarazadas. ? Nios que Haematologist. ? Personas mayores que han recibido un trasplante. ? Personas con enfermedades crnicas, como leucemia y sida. SOLICITE AYUDA SI:  El dolor no mejora con medicamentos.  El dolor no mejora despus de que la erupcin desaparece.  La erupcin parece infectada. Los signos de infeccin son los siguiente: ? Enrojecimiento. ? Hinchazn. ? Dolor que perdura o [place]. SOLICITE AYUDA DE INMEDIATO SI:  La erupcin aparece en el rostro o la nariz.  Tiene dolor en el  rostro, en la zona de los ojos o prdida de la sensibilidad en un lado del rostro.  Siente dolor o un zumbido en el odo.  Tiene prdida del gusto.  La afeccin empeora. Esta informacin no tiene Marine scientist el consejo del mdico. Asegrese de hacerle al mdico cualquier pregunta que tenga. Document Released: 05/16/2008 Document Revised: 06/11/2015 Document Reviewed: 11/29/2013 Elsevier Interactive  Patient Education  [employer].

## 2017-09-28 NOTE — Addendum Note (Signed)
Addended by: Davina Poke on: 09/28/2017 11:32 AM   Modules accepted: Orders

## 2017-09-28 NOTE — Telephone Encounter (Signed)
Unable to reach patient invaild number

## 2017-10-05 ENCOUNTER — Encounter: Payer: Self-pay | Admitting: Pulmonary Disease

## 2017-10-05 DIAGNOSIS — G4733 Obstructive sleep apnea (adult) (pediatric): Secondary | ICD-10-CM

## 2017-10-06 NOTE — Telephone Encounter (Signed)
Home sleep test 08/2017 showed mild OSA, 12/hour especially in the supine position. Since she is very symptomatic, would suggest sleeping with CPAP machine Does she already have a machine  or do  we need to send prescription?

## 2017-10-06 NOTE — Telephone Encounter (Signed)
RA - please advise. Thanks! 

## 2017-10-06 NOTE — Telephone Encounter (Signed)
Patient's daughter Kalman Shan called regarding sleep study results - She can be reached at [phone]

## 2017-10-07 ENCOUNTER — Other Ambulatory Visit: Payer: Self-pay | Admitting: Pulmonary Disease

## 2017-10-07 DIAGNOSIS — G4733 Obstructive sleep apnea (adult) (pediatric): Secondary | ICD-10-CM

## 2017-10-07 NOTE — Telephone Encounter (Signed)
Pt does not already have CPAP  Please advise on settings and will order, thanks

## 2017-10-07 NOTE — Telephone Encounter (Signed)
Auto CPAP 5 to 12 cm, nasal pillows. Office visit in 6 weeks with NP

## 2017-10-13 NOTE — Addendum Note (Signed)
Addended by: Len Blalock on: 10/13/2017 10:06 AM   Modules accepted: Orders

## 2017-11-09 ENCOUNTER — Encounter: Payer: Self-pay | Admitting: Emergency Medicine

## 2017-11-09 ENCOUNTER — Ambulatory Visit (INDEPENDENT_AMBULATORY_CARE_PROVIDER_SITE_OTHER): Payer: Medicare HMO | Admitting: Emergency Medicine

## 2017-11-09 ENCOUNTER — Other Ambulatory Visit: Payer: Self-pay

## 2017-11-09 VITALS — BP 127/67 | HR 72 | Temp 98.8°F | Resp 16 | Ht 60.5 in | Wt 158.0 lb

## 2017-11-09 DIAGNOSIS — C44709 Unspecified malignant neoplasm of skin of left lower limb, including hip: Secondary | ICD-10-CM | POA: Diagnosis not present

## 2017-11-09 DIAGNOSIS — L989 Disorder of the skin and subcutaneous tissue, unspecified: Secondary | ICD-10-CM | POA: Insufficient documentation

## 2017-11-09 DIAGNOSIS — Z23 Encounter for immunization: Secondary | ICD-10-CM

## 2017-11-09 NOTE — Patient Instructions (Signed)
° ° ° °  If you have lab work done today you will be contacted with your lab results within the next 2 weeks.  If you have not heard from us then please contact us. The fastest way to get your results is to register for My Chart. ° ° °IF you received an x-ray today, you will receive an invoice from [facility]. Please contact  Radiology at [phone] with questions or concerns regarding your invoice.  ° °IF you received labwork today, you will receive an invoice from [facility]. Please contact [facility] at [phone] with questions or concerns regarding your invoice.  ° °Our billing staff will not be able to assist you with questions regarding bills from these companies. ° °You will be contacted with the lab results as soon as they are available. The fastest way to get your results is to activate your My Chart account. Instructions are located on the last page of this paperwork. If you have not heard from us regarding the results in 2 weeks, please contact this office. °  ° ° ° °

## 2017-11-09 NOTE — Progress Notes (Signed)
Don Perking Colon 72 y.o.   Chief Complaint  Patient presents with  . Foot Pain    LEFT  on the bottom - after having surgery 04/2017    HISTORY OF PRESENT ILLNESS: [name] complaining of lesion on her left foot, status post surgery 04/2017.  Concerned underlying malignancy may be coming back.  HPI   Prior to Admission medications   Medication Sig Start Date End Date Taking? Authorizing Provider  albuterol (PROVENTIL HFA;VENTOLIN HFA) 108 (90 Base) MCG/ACT inhaler Inhale 2 puffs into the lungs every 4 (four) hours as needed for wheezing or shortness of breath. 05/29/17  Yes Parrett, Tammy S, NP  Cholecalciferol (VITAMIN D3) 10000 units TABS Take by mouth.   Yes [provider]  diltiazem (CARDIZEM CD) 240 MG 24 hr capsule Take 1 capsule (240 mg total) by mouth daily. 09/25/17 12/24/17 Yes Lawernce Earll, Ines Bloomer, MD  fluticasone furoate-vilanterol (BREO ELLIPTA) 100-25 MCG/INH AEPB Inhale 1 puff into the lungs daily. 08/19/17  Yes Rigoberto Noel, MD  montelukast (SINGULAIR) 10 MG tablet Take 1 tablet (10 mg total) by mouth at bedtime. 09/25/17 12/24/17 Yes Shamel Germond, Ines Bloomer, MD  nitroGLYCERIN (NITROSTAT) 0.4 MG SL tablet Place 0.4 mg under the tongue every 5 (five) minutes as needed for chest pain.   Yes [provider]  omeprazole (PRILOSEC) 20 MG capsule Take 1 capsule (20 mg total) by mouth daily. 09/25/17 12/24/17 Yes Alexi Geibel, Ines Bloomer, MD  simvastatin (ZOCOR) 20 MG tablet Take 1 tablet (20 mg total) by mouth daily. Office visit needed for 90 day supply 09/25/17 12/24/17 Yes Nichols Corter, Ines Bloomer, MD  cetirizine (ZYRTEC) 10 MG tablet Take 1 tablet (10 mg total) by mouth daily. Patient not taking: Reported on 09/25/2017 04/16/16   Jaynee Eagles, PA-C  Fluticasone Furoate (ARNUITY ELLIPTA) 100 MCG/ACT AEPB Inhale 1 puff into the lungs daily. Patient not taking: Reported on 09/25/2017 06/15/17   Rigoberto Noel, MD  fluticasone furoate-vilanterol  (BREO ELLIPTA) 100-25 MCG/INH AEPB Inhale 1 puff into the lungs daily. 09/10/17   Rigoberto Noel, MD  HYDROcodone-acetaminophen (NORCO) 5-325 MG tablet Take 1 tablet by mouth every 6 (six) hours as needed for moderate pain. Patient not taking: Reported on 11/09/2017 09/25/17   Shawnee Knapp, MD  tiotropium [facility]) 18 MCG inhalation capsule Place 1 capsule (18 mcg total) into inhaler and inhale daily. Patient not taking: Reported on 09/25/2017 05/13/17   Rigoberto Noel, MD  valACYclovir (VALTREX) 1000 MG tablet Take 1 tablet (1,000 mg total) by mouth 2 (two) times daily. Patient not taking: Reported on 11/09/2017 09/25/17   Horald Pollen, MD    Allergies  Allergen Reactions  . Tramadol Itching    Patient Active Problem List   Diagnosis Date Noted  . History of gastroesophageal reflux (GERD) 09/25/2017  . Herpes zoster without complication 82/99/3716  . Facial pain 09/25/2017  . Dyspnea 05/29/2017  . Osteoarthritis of multiple joints 03/24/2017  . Chronic pain of left knee 03/24/2017  . Chronic obstructive pulmonary disease ([place]) 03/24/2017  . Primary cancer of skin of left foot 02/03/2017  . Mild intermittent asthma without complication 96/78/9381  . Sleep apnea 02/03/2017  . Dyslipidemia 02/03/2017  . Migraine 02/03/2017  . Essential hypertension 09/18/2008  . ALLERGIC RHINITIS 09/18/2008  . Mild intermittent asthma with acute exacerbation 09/18/2008  . COUGH 09/18/2008    Past Medical History:  Diagnosis Date  . Allergy   . Arthritis   . Asthma   .  Blood transfusion without reported diagnosis   . Cataract   . Hyperlipidemia   . Hypertension   . Migraine   . Osteoporosis   . Sleep apnea     Past Surgical History:  Procedure Laterality Date  . ABDOMINAL HYSTERECTOMY    . CHOLECYSTECTOMY    . EYE SURGERY Left    cataract   . FOOT SURGERY Left     Social History   Socioeconomic History  . Marital status: Widowed    Spouse name: Not on file  . Number of  children: Not on file  . Years of education: Not on file  . Highest education level: Not on file  Occupational History  . Not on file  Social Needs  . Financial resource strain: Not on file  . Food insecurity:    Worry: Not on file    Inability: Not on file  . Transportation needs:    Medical: Not on file    Non-medical: Not on file  Tobacco Use  . Smoking status: Never Smoker  . Smokeless tobacco: Never Used  Substance and Sexual Activity  . Alcohol use: No  . Drug use: Never  . Sexual activity: Not on file  Lifestyle  . Physical activity:    Days per week: Not on file    Minutes per session: Not on file  . Stress: Not on file  Relationships  . Social connections:    Talks on phone: Not on file    Gets together: Not on file    Attends religious service: Not on file    Active member of club or organization: Not on file    Attends meetings of clubs or organizations: Not on file    Relationship status: Not on file  . Intimate partner violence:    Fear of current or ex partner: Not on file    Emotionally abused: Not on file    Physically abused: Not on file    Forced sexual activity: Not on file  Other Topics Concern  . Not on file  Social History Narrative  . Not on file    Family History  Problem Relation Age of Onset  . Heart disease Mother   . Hyperlipidemia Mother   . Hypertension Mother   . Stroke Mother   . Heart disease Father   . Stroke Father   . Hypertension Son   . Hyperlipidemia Son   . Fibromyalgia Son   . Lupus Son   . Hypertension Son   . Diabetes Daughter   . Hypertension Daughter      Review of Systems  Constitutional: Negative.  Negative for chills and fever.  Respiratory: Negative for shortness of breath.   Gastrointestinal: Negative for nausea and vomiting.  Musculoskeletal: Positive for joint pain (Left shoulder).  Skin: Positive for rash (Left foot).  Neurological: Negative for dizziness, focal weakness and headaches.    Endo/Heme/Allergies: Negative.   All other systems reviewed and are negative.  Vitals:   11/09/17 1006  BP: 127/67  Pulse: 72  Resp: 16  Temp: 98.8 F (37.1 C)  SpO2: 96%    Physical Exam  Constitutional: She is oriented to person, place, and time. She appears well-developed and well-nourished.  HENT:  Head: Normocephalic and atraumatic.  Eyes: Pupils are equal, round, and reactive to light.  Neck: Normal range of motion.  Cardiovascular: Normal rate.  Pulmonary/Chest: Effort normal.  Musculoskeletal: Normal range of motion.  Neurological: She is alert and oriented to person, place, and  time.  Skin: Skin is warm and dry.  Left foot: See picture above.  Nontender with no fluctuation.  Psychiatric: She has a normal mood and affect. Her behavior is normal.  Vitals reviewed.    ASSESSMENT & PLAN: Ammarie was seen today for foot pain.  Diagnoses and all orders for this visit:  Foot lesion  Primary cancer of skin of left foot  Need for prophylactic vaccination and inoculation against influenza -     Flu vaccine HIGH DOSE PF   Needs to follow-up with original orthopedic/foot surgeon who operated on her left foot for further evaluation and guidance.   Agustina Caroli, MD [facility]

## 2017-11-10 DIAGNOSIS — L905 Scar conditions and fibrosis of skin: Secondary | ICD-10-CM | POA: Diagnosis not present

## 2017-11-16 DIAGNOSIS — G4733 Obstructive sleep apnea (adult) (pediatric): Secondary | ICD-10-CM | POA: Diagnosis not present

## 2017-11-17 ENCOUNTER — Encounter: Payer: Self-pay | Admitting: *Deleted

## 2017-11-19 ENCOUNTER — Ambulatory Visit: Payer: Medicare HMO | Admitting: Adult Health

## 2017-12-08 ENCOUNTER — Ambulatory Visit: Payer: Medicare HMO | Admitting: Adult Health

## 2017-12-16 DIAGNOSIS — G4733 Obstructive sleep apnea (adult) (pediatric): Secondary | ICD-10-CM | POA: Diagnosis not present

## 2018-01-05 ENCOUNTER — Ambulatory Visit (INDEPENDENT_AMBULATORY_CARE_PROVIDER_SITE_OTHER): Payer: Medicare HMO | Admitting: Adult Health

## 2018-01-05 ENCOUNTER — Encounter: Payer: Self-pay | Admitting: Adult Health

## 2018-01-05 DIAGNOSIS — J3089 Other allergic rhinitis: Secondary | ICD-10-CM

## 2018-01-05 DIAGNOSIS — J4520 Mild intermittent asthma, uncomplicated: Secondary | ICD-10-CM | POA: Diagnosis not present

## 2018-01-05 DIAGNOSIS — G4733 Obstructive sleep apnea (adult) (pediatric): Secondary | ICD-10-CM

## 2018-01-05 NOTE — Patient Instructions (Addendum)
Contine con la inhalacin BREO 1 diariamente, enjuague despus de usar. Comience Zyrtec 10mg  al acostarse. Comience las inhalaciones de Flonase 2 a la hora de acostarse Comience Delsym 2 cucharaditas dos veces al da segn sea necesario Tos.  Continuar con CPAP a la hora de IT consultant en un peso saludable No conduzcas con sueo Seguimiento con el Dr. Elsworth Soho en 4 a 6 meses y segn sea necesario +++++++++++++++++++++++++++++++++++++++++++++++++++++++++++++++++++ Continue on BREO 1 puff daily , rinse after use.  Begin Zyrtec 10mg  At bedtime  .  Begin Flonase 2 puffs At bedtime   Begin Delsym 2 tsp Twice daily  As needed  Cough .   Continue on CPAP at bedtime Work on healthy weight Do not drive a sleepy Follow-up with Dr. Elsworth Soho in 4 to 6 months and as needed

## 2018-01-05 NOTE — Assessment & Plan Note (Signed)
Mild flare . Add zyrtec and flonase   Plan  Patient Instructions  Contine con la inhalacin BREO 1 diariamente, enjuague despus de usar. Comience Zyrtec 10mg  al acostarse. Comience las inhalaciones de Flonase 2 a la hora de acostarse Comience Delsym 2 cucharaditas dos veces al da segn sea necesario Tos.  Continuar con CPAP a la hora de IT consultant en un peso saludable No conduzcas con sueo Seguimiento con el Dr. Elsworth Soho en 4 a 6 meses y segn sea necesario +++++++++++++++++++++++++++++++++++++++++++++++++++++++++++++++++++ Continue on BREO 1 puff daily , rinse after use.  Begin Zyrtec 10mg  At bedtime  .  Begin Flonase 2 puffs At bedtime   Begin Delsym 2 tsp Twice daily  As needed  Cough .   Continue on CPAP at bedtime Work on healthy weight Do not drive a sleepy Follow-up with Dr. Elsworth Soho in 4 to 6 months and as needed

## 2018-01-05 NOTE — Progress Notes (Signed)
@Patient  ID: Dana Daniels, female    DOB: 1945/04/05, 72 y.o.   MRN: [id number]  Chief Complaint  Patient presents with  . Follow-up    Asthma and sleep apnea    Referring provider: Horald Pollen, *  HPI: 72 year old [place] female followed for asthma and obstructive sleep apnea  TEST  Spirometry 04/2017 >> poor effort but showed moderate restriction with ratio of 80 and FEV1 of 65% and FVC of 61%  01/05/2018 Follow up ; Asthma and OSA  Patient returns for a 68-month follow-up.  She has underlying asthma.  She remains on Brio daily Says overall breathing is doing well.  She denies any shortness of breath or wheezing.  Does have a dry cough.  Says it it is aggravated by nasal congestion and drainage in her throat.  She is using Robitussin without much relief. He denies any flare of wheezing or increased albuterol use  She has underlying sleep apnea.  Is on CPAP at bedtime.  Says that she is wearing each night.  She feels rested and feels that she benefits from CPAP.  Download shows good compliance with average usage at 5.5 hours.  Patient is on AutoSet 5 to 12 cm H2O.  AHI 0.8.  Minimum leaks.  He is recently got a new nasal mask and likes it a lot.  Says it fits much better.  Interpreter present for today's visit Allergies  Allergen Reactions  . Tramadol Itching    Immunization History  Administered Date(s) Administered  . Influenza, High Dose Seasonal PF 11/09/2017  . Influenza-Unspecified 12/17/2016  . Pneumococcal-Unspecified 12/17/2016    Past Medical History:  Diagnosis Date  . Allergy   . Arthritis   . Asthma   . Blood transfusion without reported diagnosis   . Cataract   . Hyperlipidemia   . Hypertension   . Migraine   . Osteoporosis   . Sleep apnea     Tobacco History: Social History   Tobacco Use  Smoking Status Never Smoker  Smokeless Tobacco Never Used   Counseling given: Not Answered   Outpatient Medications Prior to Visit   Medication Sig Dispense Refill  . albuterol (PROVENTIL HFA;VENTOLIN HFA) 108 (90 Base) MCG/ACT inhaler Inhale 2 puffs into the lungs every 4 (four) hours as needed for wheezing or shortness of breath. 1 Inhaler 5  . Cholecalciferol (VITAMIN D3) 10000 units TABS Take by mouth.    . fluticasone furoate-vilanterol (BREO ELLIPTA) 100-25 MCG/INH AEPB Inhale 1 puff into the lungs daily. 3 each 0  . HYDROcodone-acetaminophen (NORCO) 5-325 MG tablet Take 1 tablet by mouth every 6 (six) hours as needed for moderate pain. 15 tablet 0  . nitroGLYCERIN (NITROSTAT) 0.4 MG SL tablet Place 0.4 mg under the tongue every 5 (five) minutes as needed for chest pain.    . valACYclovir (VALTREX) 1000 MG tablet Take 1 tablet (1,000 mg total) by mouth 2 (two) times daily. 20 tablet 0  . diltiazem (CARDIZEM CD) 240 MG 24 hr capsule Take 1 capsule (240 mg total) by mouth daily. 90 capsule 3  . montelukast (SINGULAIR) 10 MG tablet Take 1 tablet (10 mg total) by mouth at bedtime. 90 tablet 3  . omeprazole (PRILOSEC) 20 MG capsule Take 1 capsule (20 mg total) by mouth daily. 90 capsule 3  . simvastatin (ZOCOR) 20 MG tablet Take 1 tablet (20 mg total) by mouth daily. Office visit needed for 90 day supply 90 tablet 3  . cetirizine (ZYRTEC) 10 MG tablet  Take 1 tablet (10 mg total) by mouth daily. (Patient not taking: Reported on 09/25/2017) 30 tablet 11  . Fluticasone Furoate (ARNUITY ELLIPTA) 100 MCG/ACT AEPB Inhale 1 puff into the lungs daily. (Patient not taking: Reported on 09/25/2017) 1 each 4  . fluticasone furoate-vilanterol (BREO ELLIPTA) 100-25 MCG/INH AEPB Inhale 1 puff into the lungs daily. 1 each 0  . tiotropium (SPIRIVA) 18 MCG inhalation capsule Place 1 capsule (18 mcg total) into inhaler and inhale daily. (Patient not taking: Reported on 09/25/2017) 30 capsule 5   No facility-administered medications prior to visit.      Review of Systems  Constitutional:   No  weight loss, night sweats,  Fevers, chills,  fatigue, or  lassitude.  HEENT:   No headaches,  Difficulty swallowing,  Tooth/dental problems, or  Sore throat,                No sneezing, itching, ear ache,  +nasal congestion, post nasal drip,   CV:  No chest pain,  Orthopnea, PND, swelling in lower extremities, anasarca, dizziness, palpitations, syncope.   GI  No heartburn, indigestion, abdominal pain, nausea, vomiting, diarrhea, change in bowel habits, loss of appetite, bloody stools.   Resp:  .  No chest wall deformity  Skin: no rash or lesions.  GU: no dysuria, change in color of urine, no urgency or frequency.  No flank pain, no hematuria   MS:  No joint pain or swelling.  No decreased range of motion.  No back pain.    Physical Exam  BP 126/64 (BP Location: Left Arm, Cuff Size: Normal)   Pulse 73   Ht 5' (1.524 m)   Wt 161 lb 3.2 oz (73.1 kg)   SpO2 96%   BMI 31.48 kg/m   GEN: A/Ox3; pleasant NAD    HEENT:  [place]/AT,  EACs-clear, TMs-wnl, NOSE-clear drainage , THROAT-clear, no lesions, no postnasal drip or exudate noted. Class 2 MP airway   NECK:  Supple w/ fair ROM; no JVD; normal carotid impulses w/o bruits; no thyromegaly or nodules palpated; no lymphadenopathy.    RESP  Clear  P & A; w/o, wheezes/ rales/ or rhonchi. no accessory muscle use, no dullness to percussion  CARD:  RRR, no m/r/g, no peripheral edema, pulses intact, no cyanosis or clubbing.  GI:   Soft & nt; nml bowel sounds; no organomegaly or masses detected.   Musco: Warm bil, no deformities or joint swelling noted.   Neuro: alert, no focal deficits noted.    Skin: Warm, no lesions or rashes    Lab Results:  CBC   BMET  BNP No results found for: BNP  ProBNP No results found for: PROBNP  Imaging: No results found.    No flowsheet data found.  No results found for: NITRICOXIDE      Assessment & Plan:   Mild intermittent asthma without complication Controlled on current regimen  Plan  Patient Instructions  Contine  con la inhalacin BREO 1 diariamente, enjuague despus de usar. Comience Zyrtec 10mg  al acostarse. Comience las inhalaciones de Flonase 2 a la hora de acostarse Comience Delsym 2 cucharaditas dos veces al da segn sea necesario Tos.  Continuar con CPAP a la hora de IT consultant en un peso saludable No conduzcas con sueo Seguimiento con el Dr. Elsworth Soho en 4 a 6 meses y segn sea necesario +++++++++++++++++++++++++++++++++++++++++++++++++++++++++++++++++++ Continue on BREO 1 puff daily , rinse after use.  Begin Zyrtec 10mg  At bedtime  .  Begin Flonase 2 puffs At  bedtime   Begin Delsym 2 tsp Twice daily  As needed  Cough .   Continue on CPAP at bedtime Work on healthy weight Do not drive a sleepy Follow-up with Dr. Elsworth Soho in 4 to 6 months and as needed     Allergic rhinitis Mild flare . Add zyrtec and flonase   Plan  Patient Instructions  Contine con la inhalacin BREO 1 diariamente, enjuague despus de usar. Comience Zyrtec 10mg  al acostarse. Comience las inhalaciones de Flonase 2 a la hora de acostarse Comience Delsym 2 cucharaditas dos veces al da segn sea necesario Tos.  Continuar con CPAP a la hora de IT consultant en un peso saludable No conduzcas con sueo Seguimiento con el Dr. Elsworth Soho en 4 a 6 meses y segn sea necesario +++++++++++++++++++++++++++++++++++++++++++++++++++++++++++++++++++ Continue on BREO 1 puff daily , rinse after use.  Begin Zyrtec 10mg  At bedtime  .  Begin Flonase 2 puffs At bedtime   Begin Delsym 2 tsp Twice daily  As needed  Cough .   Continue on CPAP at bedtime Work on healthy weight Do not drive a sleepy Follow-up with Dr. Elsworth Soho in 4 to 6 months and as needed     Sleep apnea Well controlled on CPAP  No changes   Plan  Patient Instructions  Contine con la inhalacin BREO 1 diariamente, enjuague despus de usar. Comience Zyrtec 10mg  al acostarse. Comience las inhalaciones de Flonase 2 a la hora de acostarse Comience Delsym 2  cucharaditas dos veces al da segn sea necesario Tos.  Continuar con CPAP a la hora de IT consultant en un peso saludable No conduzcas con sueo Seguimiento con el Dr. Elsworth Soho en 4 a 6 meses y segn sea necesario +++++++++++++++++++++++++++++++++++++++++++++++++++++++++++++++++++ Continue on BREO 1 puff daily , rinse after use.  Begin Zyrtec 10mg  At bedtime  .  Begin Flonase 2 puffs At bedtime   Begin Delsym 2 tsp Twice daily  As needed  Cough .   Continue on CPAP at bedtime Work on healthy weight Do not drive a sleepy Follow-up with Dr. Elsworth Soho in 4 to 6 months and as needed  '     Ulyssa Walthour, NP 01/05/2018

## 2018-01-05 NOTE — Assessment & Plan Note (Signed)
Controlled on current regimen  Plan  Patient Instructions  Contine con la inhalacin BREO 1 diariamente, enjuague despus de usar. Comience Zyrtec 10mg  al acostarse. Comience las inhalaciones de Flonase 2 a la hora de acostarse Comience Delsym 2 cucharaditas dos veces al da segn sea necesario Tos.  Continuar con CPAP a la hora de IT consultant en un peso saludable No conduzcas con sueo Seguimiento con el Dr. Elsworth Soho en 4 a 6 meses y segn sea necesario +++++++++++++++++++++++++++++++++++++++++++++++++++++++++++++++++++ Continue on BREO 1 puff daily , rinse after use.  Begin Zyrtec 10mg  At bedtime  .  Begin Flonase 2 puffs At bedtime   Begin Delsym 2 tsp Twice daily  As needed  Cough .   Continue on CPAP at bedtime Work on healthy weight Do not drive a sleepy Follow-up with Dr. Elsworth Soho in 4 to 6 months and as needed

## 2018-01-05 NOTE — Assessment & Plan Note (Signed)
Well controlled on CPAP  No changes   Plan  Patient Instructions  Contine con la inhalacin BREO 1 diariamente, enjuague despus de usar. Comience Zyrtec 10mg  al acostarse. Comience las inhalaciones de Flonase 2 a la hora de acostarse Comience Delsym 2 cucharaditas dos veces al da segn sea necesario Tos.  Continuar con CPAP a la hora de IT consultant en un peso saludable No conduzcas con sueo Seguimiento con el Dr. Elsworth Soho en 4 a 6 meses y segn sea necesario +++++++++++++++++++++++++++++++++++++++++++++++++++++++++++++++++++ Continue on BREO 1 puff daily , rinse after use.  Begin Zyrtec 10mg  At bedtime  .  Begin Flonase 2 puffs At bedtime   Begin Delsym 2 tsp Twice daily  As needed  Cough .   Continue on CPAP at bedtime Work on healthy weight Do not drive a sleepy Follow-up with Dr. Elsworth Soho in 4 to 6 months and as needed  '

## 2018-01-16 DIAGNOSIS — G4733 Obstructive sleep apnea (adult) (pediatric): Secondary | ICD-10-CM | POA: Diagnosis not present

## 2018-01-21 DIAGNOSIS — H524 Presbyopia: Secondary | ICD-10-CM | POA: Diagnosis not present

## 2018-02-02 ENCOUNTER — Encounter: Payer: Self-pay | Admitting: Emergency Medicine

## 2018-02-02 ENCOUNTER — Other Ambulatory Visit: Payer: Self-pay

## 2018-02-02 ENCOUNTER — Ambulatory Visit (INDEPENDENT_AMBULATORY_CARE_PROVIDER_SITE_OTHER): Payer: Medicare HMO | Admitting: Emergency Medicine

## 2018-02-02 VITALS — BP 113/64 | HR 72 | Temp 98.8°F | Resp 16 | Ht 60.5 in | Wt 161.2 lb

## 2018-02-02 DIAGNOSIS — G43709 Chronic migraine without aura, not intractable, without status migrainosus: Secondary | ICD-10-CM | POA: Diagnosis not present

## 2018-02-02 DIAGNOSIS — Z23 Encounter for immunization: Secondary | ICD-10-CM | POA: Diagnosis not present

## 2018-02-02 DIAGNOSIS — M25552 Pain in left hip: Secondary | ICD-10-CM

## 2018-02-02 DIAGNOSIS — J302 Other seasonal allergic rhinitis: Secondary | ICD-10-CM

## 2018-02-02 MED ORDER — CETIRIZINE HCL 10 MG PO TABS: 10.0000 mg | ORAL_TABLET | Freq: Every day | ORAL | 11 refills | Status: DC

## 2018-02-02 MED ORDER — HYDROCODONE-ACETAMINOPHEN 5-325 MG PO TABS: 1.0000 | ORAL_TABLET | Freq: Four times a day (QID) | ORAL | 0 refills | Status: DC | PRN

## 2018-02-02 MED ORDER — DICLOFENAC SODIUM 75 MG PO TBEC: 75.0000 mg | DELAYED_RELEASE_TABLET | Freq: Two times a day (BID) | ORAL | 0 refills | Status: AC

## 2018-02-02 MED ORDER — CROMOLYN SODIUM 5.2 MG/ACT NA AERS: 1.0000 | INHALATION_SPRAY | Freq: Two times a day (BID) | NASAL | 12 refills | Status: DC

## 2018-02-02 NOTE — Progress Notes (Signed)
Dana Daniels 72 y.o.   Chief Complaint  Patient presents with  . Hip Pain    x 2 weeks  . Headache    x 1 weeks    HISTORY OF PRESENT ILLNESS: [name] complaining of pain to her left hip.  Has a history of chronic pain but worse the past couple days.  Denies any new injuries. Also complaining of chronic migraine headaches, bad the past 2 days. Requesting shingles vaccine. Also has a history of seasonal allergies, inquiring about medications.  Takes singular daily. No other significant symptoms.  HPI   Prior to Admission medications   Medication Sig Start Date End Date Taking? Authorizing Provider  albuterol (PROVENTIL HFA;VENTOLIN HFA) 108 (90 Base) MCG/ACT inhaler Inhale 2 puffs into the lungs every 4 (four) hours as needed for wheezing or shortness of breath. 05/29/17  Yes Parrett, Tammy S, NP  fluticasone furoate-vilanterol (BREO ELLIPTA) 100-25 MCG/INH AEPB Inhale 1 puff into the lungs daily. 09/10/17  Yes Rigoberto Noel, MD  nitroGLYCERIN (NITROSTAT) 0.4 MG SL tablet Place 0.4 mg under the tongue every 5 (five) minutes as needed for chest pain.   Yes [provider]  Cholecalciferol (VITAMIN D3) 10000 units TABS Take by mouth.    [provider]  diltiazem (CARDIZEM CD) 240 MG 24 hr capsule Take 1 capsule (240 mg total) by mouth daily. 09/25/17 12/24/17  Horald Pollen, MD  HYDROcodone-acetaminophen (NORCO) 5-325 MG tablet Take 1 tablet by mouth every 6 (six) hours as needed for moderate pain. Patient not taking: Reported on 02/02/2018 09/25/17   Shawnee Knapp, MD  montelukast (SINGULAIR) 10 MG tablet Take 1 tablet (10 mg total) by mouth at bedtime. 09/25/17 12/24/17  Horald Pollen, MD  omeprazole (PRILOSEC) 20 MG capsule Take 1 capsule (20 mg total) by mouth daily. 09/25/17 12/24/17  Horald Pollen, MD  simvastatin (ZOCOR) 20 MG tablet Take 1 tablet (20 mg total) by mouth daily. Office visit needed for 90 day supply  09/25/17 12/24/17  Horald Pollen, MD  valACYclovir (VALTREX) 1000 MG tablet Take 1 tablet (1,000 mg total) by mouth 2 (two) times daily. Patient not taking: Reported on 02/02/2018 09/25/17   Horald Pollen, MD    Allergies  Allergen Reactions  . Tramadol Itching    Patient Active Problem List   Diagnosis Date Noted  . Foot lesion 11/09/2017  . History of gastroesophageal reflux (GERD) 09/25/2017  . Herpes zoster without complication 95/18/8416  . Facial pain 09/25/2017  . Dyspnea 05/29/2017  . Osteoarthritis of multiple joints 03/24/2017  . Chronic pain of left knee 03/24/2017  . Chronic obstructive pulmonary disease ([place]) 03/24/2017  . Primary cancer of skin of left foot 02/03/2017  . Mild intermittent asthma without complication 60/63/0160  . Sleep apnea 02/03/2017  . Dyslipidemia 02/03/2017  . Migraine 02/03/2017  . Essential hypertension 09/18/2008  . Allergic rhinitis 09/18/2008  . Mild intermittent asthma with acute exacerbation 09/18/2008  . COUGH 09/18/2008    Past Medical History:  Diagnosis Date  . Allergy   . Arthritis   . Asthma   . Blood transfusion without reported diagnosis   . Cataract   . Hyperlipidemia   . Hypertension   . Migraine   . Osteoporosis   . Sleep apnea     Past Surgical History:  Procedure Laterality Date  . ABDOMINAL HYSTERECTOMY    . CHOLECYSTECTOMY    . EYE SURGERY Left    cataract   .  FOOT SURGERY Left     Social History   Socioeconomic History  . Marital status: Widowed    Spouse name: Not on file  . Number of children: Not on file  . Years of education: Not on file  . Highest education level: Not on file  Occupational History  . Not on file  Social Needs  . Financial resource strain: Not on file  . Food insecurity:    Worry: Not on file    Inability: Not on file  . Transportation needs:    Medical: Not on file    Non-medical: Not on file  Tobacco Use  . Smoking status: Never Smoker  . Smokeless  tobacco: Never Used  Substance and Sexual Activity  . Alcohol use: No  . Drug use: Never  . Sexual activity: Not on file  Lifestyle  . Physical activity:    Days per week: Not on file    Minutes per session: Not on file  . Stress: Not on file  Relationships  . Social connections:    Talks on phone: Not on file    Gets together: Not on file    Attends religious service: Not on file    Active member of club or organization: Not on file    Attends meetings of clubs or organizations: Not on file    Relationship status: Not on file  . Intimate partner violence:    Fear of current or ex partner: Not on file    Emotionally abused: Not on file    Physically abused: Not on file    Forced sexual activity: Not on file  Other Topics Concern  . Not on file  Social History Narrative  . Not on file    Family History  Problem Relation Age of Onset  . Heart disease Mother   . Hyperlipidemia Mother   . Hypertension Mother   . Stroke Mother   . Heart disease Father   . Stroke Father   . Hypertension Son   . Hyperlipidemia Son   . Fibromyalgia Son   . Lupus Son   . Hypertension Son   . Diabetes Daughter   . Hypertension Daughter      Review of Systems  Constitutional: Negative.  Negative for chills and fever.  HENT: Negative.  Negative for nosebleeds and sore throat.   Eyes: Negative.  Negative for blurred vision and double vision.  Respiratory: Negative.  Negative for cough and shortness of breath.   Cardiovascular: Negative.  Negative for chest pain and palpitations.  Gastrointestinal: Negative.  Negative for abdominal pain, diarrhea, nausea and vomiting.  Genitourinary: Negative.  Negative for dysuria and hematuria.  Musculoskeletal: Positive for joint pain (Left hip pain).  Skin: Negative.  Negative for rash.  Neurological: Positive for headaches (Chronic migraines). Negative for dizziness, sensory change, speech change, focal weakness and weakness.  Endo/Heme/Allergies:  Negative.   All other systems reviewed and are negative.   Vitals:   02/02/18 1422  BP: 113/64  Pulse: 72  Resp: 16  Temp: 98.8 F (37.1 C)  SpO2: 97%    Physical Exam  Constitutional: She is oriented to person, place, and time. She appears well-developed and well-nourished.  HENT:  Head: Normocephalic.  Nose: Nose normal.  Mouth/Throat: Oropharynx is clear and moist.  Eyes: Pupils are equal, round, and reactive to light. Conjunctivae and EOM are normal.  Neck: Normal range of motion. Neck supple.  Cardiovascular: Normal rate, regular rhythm and normal heart sounds.  Pulmonary/Chest: Effort  normal and breath sounds normal.  Abdominal: Soft. There is no tenderness.  Musculoskeletal:  Left hip: Positive tenderness, full range of motion.  Lymphadenopathy:    She has no cervical adenopathy.  Neurological: She is alert and oriented to person, place, and time. She displays normal reflexes. No sensory deficit. She exhibits normal muscle tone. Coordination normal.  Skin: Skin is warm and dry. Capillary refill takes less than 2 seconds.  Psychiatric: She has a normal mood and affect. Her behavior is normal.     ASSESSMENT & PLAN: Shuntay was seen today for hip pain and headache.  Diagnoses and all orders for this visit:  Left hip pain -     Discontinue: HYDROcodone-acetaminophen (NORCO) 5-325 MG tablet; Take 1 tablet by mouth every 6 (six) hours as needed for moderate pain. -     diclofenac (VOLTAREN) 75 MG EC tablet; Take 1 tablet (75 mg total) by mouth 2 (two) times daily for 5 days. After 5 days take as needed. -     HYDROcodone-acetaminophen (NORCO) 5-325 MG tablet; Take 1 tablet by mouth every 6 (six) hours as needed for moderate pain.  Chronic migraine without aura without status migrainosus, not intractable -     Discontinue: HYDROcodone-acetaminophen (NORCO) 5-325 MG tablet; Take 1 tablet by mouth every 6 (six) hours as needed for moderate pain. -     diclofenac  (VOLTAREN) 75 MG EC tablet; Take 1 tablet (75 mg total) by mouth 2 (two) times daily for 5 days. After 5 days take as needed. -     HYDROcodone-acetaminophen (NORCO) 5-325 MG tablet; Take 1 tablet by mouth every 6 (six) hours as needed for moderate pain.  Need for shingles vaccine -     Cancel: Varicella-zoster vaccine IM (Shingrix)  Seasonal allergies -     cetirizine (ZYRTEC) 10 MG tablet; Take 1 tablet (10 mg total) by mouth daily for 7 days. -     cromolyn (NASALCROM) 5.2 MG/ACT nasal spray; Place 1 spray into both nostrils 2 (two) times daily.    Patient Instructions       If you have lab work done today you will be contacted with your lab results within the next 2 weeks.  If you have not heard from [place] then please contact us. The fastest way to get your results is to register for My Chart.   IF you received an x-ray today, you will receive an invoice from [facility]. Please contact [facility] at [phone] with questions or concerns regarding your invoice.   IF you received labwork today, you will receive an invoice from [facility]. Please contact [facility] at [phone] with questions or concerns regarding your invoice.   Our billing staff will not be able to assist you with questions regarding bills from these companies.  You will be contacted with the lab results as soon as they are available. The fastest way to get your results is to activate your My Chart account. Instructions are located on the last page of this paperwork. If you have not heard from [place] regarding the results in 2 weeks, please contact this office.     Hip Pain The hip is the joint between the upper legs and the lower pelvis. The bones, cartilage, tendons, and muscles of your hip joint support your body and allow you to move around. Hip pain can range from a minor ache to severe pain in one or both of your hips. The pain may be felt on the inside of the  hip joint near the groin, or the  outside near the buttocks and upper thigh. You may also have swelling or stiffness. Follow these instructions at home: Managing pain, stiffness, and swelling  If directed, apply ice to the injured area. ? Put ice in a plastic bag. ? Place a towel between your skin and the bag. ? Leave the ice on for 20 minutes, 2-3 times a day  Sleep with a pillow between your legs on your most comfortable side.  Avoid any activities that cause pain. General instructions  Take over-the-counter and prescription medicines only as told by your health care provider.  Do any exercises as told by your health care provider.  Record the following: ? How often you have hip pain. ? The location of your pain. ? What the pain feels like. ? What makes the pain worse.  Keep all follow-up visits as told by your health care provider. This is important. Contact a health care provider if:  You cannot put weight on your leg.  Your pain or swelling continues or gets worse after one week.  It gets harder to walk.  You have a fever. Get help right away if:  You fall.  You have a sudden increase in pain and swelling in your hip.  Your hip is red or swollen or very tender to touch. Summary  Hip pain can range from a minor ache to severe pain in one or both of your hips.  The pain may be felt on the inside of the hip joint near the groin, or the outside near the buttocks and upper thigh.  Avoid any activities that cause pain.  Record how often you have hip pain, the location of the pain, what makes it worse and what it feels like. This information is not intended to replace advice given to you by your health care provider. Make sure you discuss any questions you have with your health care provider. Document Released: 08/07/2009 Document Revised: 01/21/2016 Document Reviewed: 01/21/2016 Elsevier Interactive Patient Education  2018 [employer].      Agustina Caroli, MD [facility]

## 2018-02-02 NOTE — Patient Instructions (Addendum)
     If you have lab work done today you will be contacted with your lab results within the next 2 weeks.  If you have not heard from us then please contact us. The fastest way to get your results is to register for My Chart.   IF you received an x-ray today, you will receive an invoice from [facility]. Please contact [facility] at [phone] with questions or concerns regarding your invoice.   IF you received labwork today, you will receive an invoice from [facility]. Please contact [facility] at [phone] with questions or concerns regarding your invoice.   Our billing staff will not be able to assist you with questions regarding bills from these companies.  You will be contacted with the lab results as soon as they are available. The fastest way to get your results is to activate your My Chart account. Instructions are located on the last page of this paperwork. If you have not heard from us regarding the results in 2 weeks, please contact this office.      Hip Pain The hip is the joint between the upper legs and the lower pelvis. The bones, cartilage, tendons, and muscles of your hip joint support your body and allow you to move around. Hip pain can range from a minor ache to severe pain in one or both of your hips. The pain may be felt on the inside of the hip joint near the groin, or the outside near the buttocks and upper thigh. You may also have swelling or stiffness. Follow these instructions at home: Managing pain, stiffness, and swelling  If directed, apply ice to the injured area. ? Put ice in a plastic bag. ? Place a towel between your skin and the bag. ? Leave the ice on for 20 minutes, 2-3 times a day  Sleep with a pillow between your legs on your most comfortable side.  Avoid any activities that cause pain. General instructions  Take over-the-counter and prescription medicines only as told by your health care provider.  Do any exercises as told  by your health care provider.  Record the following: ? How often you have hip pain. ? The location of your pain. ? What the pain feels like. ? What makes the pain worse.  Keep all follow-up visits as told by your health care provider. This is important. Contact a health care provider if:  You cannot put weight on your leg.  Your pain or swelling continues or gets worse after one week.  It gets harder to walk.  You have a fever. Get help right away if:  You fall.  You have a sudden increase in pain and swelling in your hip.  Your hip is red or swollen or very tender to touch. Summary  Hip pain can range from a minor ache to severe pain in one or both of your hips.  The pain may be felt on the inside of the hip joint near the groin, or the outside near the buttocks and upper thigh.  Avoid any activities that cause pain.  Record how often you have hip pain, the location of the pain, what makes it worse and what it feels like. This information is not intended to replace advice given to you by your health care provider. Make sure you discuss any questions you have with your health care provider. Document Released: 08/07/2009 Document Revised: 01/21/2016 Document Reviewed: 01/21/2016 Elsevier Interactive Patient Education  2018 [employer]  

## 2018-02-15 DIAGNOSIS — G4733 Obstructive sleep apnea (adult) (pediatric): Secondary | ICD-10-CM | POA: Diagnosis not present

## 2018-02-26 DIAGNOSIS — H52209 Unspecified astigmatism, unspecified eye: Secondary | ICD-10-CM | POA: Diagnosis not present

## 2018-02-26 DIAGNOSIS — H5213 Myopia, bilateral: Secondary | ICD-10-CM | POA: Diagnosis not present

## 2018-03-05 DIAGNOSIS — G4733 Obstructive sleep apnea (adult) (pediatric): Secondary | ICD-10-CM | POA: Diagnosis not present

## 2018-03-18 DIAGNOSIS — G4733 Obstructive sleep apnea (adult) (pediatric): Secondary | ICD-10-CM | POA: Diagnosis not present

## 2018-03-31 ENCOUNTER — Encounter: Payer: Self-pay | Admitting: Emergency Medicine

## 2018-03-31 ENCOUNTER — Ambulatory Visit (INDEPENDENT_AMBULATORY_CARE_PROVIDER_SITE_OTHER): Payer: Medicare HMO | Admitting: Emergency Medicine

## 2018-03-31 VITALS — BP 145/74 | HR 80 | Temp 98.9°F | Resp 17 | Ht 60.5 in | Wt 156.0 lb

## 2018-03-31 DIAGNOSIS — J302 Other seasonal allergic rhinitis: Secondary | ICD-10-CM | POA: Diagnosis not present

## 2018-03-31 DIAGNOSIS — R7309 Other abnormal glucose: Secondary | ICD-10-CM | POA: Diagnosis not present

## 2018-03-31 DIAGNOSIS — E785 Hyperlipidemia, unspecified: Secondary | ICD-10-CM

## 2018-03-31 DIAGNOSIS — I10 Essential (primary) hypertension: Secondary | ICD-10-CM | POA: Diagnosis not present

## 2018-03-31 DIAGNOSIS — Z8719 Personal history of other diseases of the digestive system: Secondary | ICD-10-CM | POA: Diagnosis not present

## 2018-03-31 DIAGNOSIS — J449 Chronic obstructive pulmonary disease, unspecified: Secondary | ICD-10-CM | POA: Diagnosis not present

## 2018-03-31 MED ORDER — OMEPRAZOLE 20 MG PO CPDR: 20.0000 mg | DELAYED_RELEASE_CAPSULE | Freq: Every day | ORAL | 3 refills | Status: DC

## 2018-03-31 MED ORDER — MONTELUKAST SODIUM 10 MG PO TABS: 10.0000 mg | ORAL_TABLET | Freq: Every day | ORAL | 3 refills | Status: DC

## 2018-03-31 MED ORDER — CETIRIZINE HCL 10 MG PO TABS: 10.0000 mg | ORAL_TABLET | Freq: Every day | ORAL | 11 refills | Status: DC

## 2018-03-31 MED ORDER — SIMVASTATIN 20 MG PO TABS: 20.0000 mg | ORAL_TABLET | Freq: Every day | ORAL | 3 refills | Status: DC

## 2018-03-31 MED ORDER — DILTIAZEM HCL ER COATED BEADS 240 MG PO CP24: 240.0000 mg | ORAL_CAPSULE | Freq: Every day | ORAL | 3 refills | Status: DC

## 2018-03-31 NOTE — Patient Instructions (Signed)
[place] (Health Maintenance, Female) Un estilo de vida saludable y los cuidados preventivos pueden favorecer considerablemente a la salud y Musician. Pregunte a su mdico cul es el cronograma de exmenes peridicos apropiado para usted. Esta es una buena oportunidad para consultarlo sobre cmo prevenir enfermedades y [place] sano. Adems de los controles, hay muchas otras cosas que puede hacer usted mismo. Los expertos han realizado numerosas investigaciones [employer] cambios en el estilo de vida y las medidas de prevencin que, [place], lo ayudarn a mantenerse sano. Solicite a su mdico ms informacin. EL PESO Y LA DIETA Consuma una dieta saludable.  Asegrese de [employer] verduras, frutas, productos lcteos de bajo contenido de [place] y Advertising account planner.  No consuma muchos alimentos de alto contenido de grasas slidas, azcares agregados o sal.  Realice actividad fsica con regularidad. Esta es una de las prcticas ms importantes que puede hacer por su salud. ? La Delorise Shiner de los adultos deben hacer ejercicio durante al menos 124mnutos por semana. El ejercicio debe aumentar la frecuencia cardaca y pActorla transpiracin (ejercicio de [place]. ? La mayora de los adultos tambin deben hacer ejercicios de elongacin al mToysRusveces a la semana. Agregue esto al su plan de ejercicio de intensidad moderada. Mantenga un peso saludable.  El ndice de masa corporal ([facility] es una medida que puede utilizarse para identificar posibles problemas de pEast Uniontown Proporciona una estimacin de la grasa corporal basndose en el peso y la altura. Su mdico puede ayudarle a dRadiation protection practitionerISouth Endy a lScientist, forensico mTheatre managerun peso saludable.  Para las mujeres de 20aos o ms: ? Un IJohn R. Oishei Children'S Hospitalmenor de 18,5 se considera bajo peso. ? Un ICumberland County Hospitalentre 18,5 y 24,9 es normal. ? Un IPelham Medical Centerentre 25 y 29,9 se considera sobrepeso. ? Un IMC de 30 o ms se considera  obesidad. Observe los niveles de colesterol y lpidos en la sangre.  Debe comenzar a rEnglish as a second language teacherde lpidos y cResearch officer, trade unionen la sangre a los 20aos y luego repetirlos cada 516aos  Es posible que nAutomotive engineerlos niveles de colesterol con mayor frecuencia si: ? Sus niveles de lpidos y colesterol son altos. ? Es mayor de 527CWC ? Presenta un alto riesgo de padecer enfermedades cardacas. DETECCIN DE CNCER Cncer de pulmn  Se recomienda realizar exmenes de deteccin de cncer de pulmn a personas adultas entre 574y 892aos que estn en riesgo de dHorticulturist, commercialde pulmn por sus antecedentes de consumo de tabaco.  Se recomienda una tomografa computarizada de baja dosis de los pulmones todos los aos a las personas que: ? Fuman actualmente. ? Hayan dejado el hbito en algn momento en los ltimos 15aos. ? Hayan fumado durante 30aos un paquete diario. Un paquete-ao equivale a fumar un promedio de un paquete de cigarrillos diario durante un ao.  Los exmenes de deteccin anuales deben continuar hasta que hayan pasado 15aos desde que dej de fumar.  Ya no debern realizarse si tiene un problema de salud que le impida recibir tratamiento para eScience writerde pulmn. Cncer de mama  Practique la autoconciencia de la mama. Esto significa reconocer la apariencia normal de sus mamas y cmo las siente.  Tambin significa realizar autoexmenes regulares de lJohnson & Johnson Informe a su mdico sobre cualquier cambio, sin importar cun pequeo sea.  Si tiene entre 20 y 363aos, un mdico debe realizarle un examen clnico de las mamas como parte del examen regular de s[place] cada 1 a  3aos.  Si tiene 40aos o ms, debe realizarse un examen clnico de las Microsoft. Tambin considere realizarse una [place] todos los [place].  Si tiene antecedentes familiares de cncer de mama, hable con su mdico para someterse a un estudio gentico.  Si  tiene alto riesgo de Chief Financial Officer de mama, hable con su mdico para someterse a Public house manager y [employer].  La evaluacin del gen del cncer de mama (BRCA) se recomienda a mujeres que tengan familiares con cnceres relacionados con el BRCA. Los cnceres relacionados con el BRCA incluyen los siguientes: ? [place]. ? Ovario. ? Trompas. ? Cnceres de peritoneo.  Los resultados de la evaluacin determinarn la necesidad de asesoramiento gentico y de [place] de BRCA1 y BRCA2. Cncer de cuello del tero El mdico puede recomendarle que se haga pruebas peridicas de deteccin de cncer de los rganos de la pelvis (ovarios, tero y vagina). Estas pruebas incluyen un examen plvico, que abarca controlar si se produjeron cambios microscpicos en la superficie del cuello del tero (prueba de Papanicolaou). Pueden recomendarle que se haga estas pruebas cada 3aos, a partir de los 21aos.  A las mujeres que tienen entre 30 y 26aos, los mdicos pueden recomendarles que se sometan a exmenes plvicos y pruebas de Papanicolaou cada 55aos, o a la prueba de Papanicolaou y el examen plvico en combinacin con estudios de deteccin del virus del papiloma humano (VPH) cada 5aos. Algunos tipos de VPH aumentan el riesgo de Chief Financial Officer de cuello del tero. La prueba para la deteccin del VPH tambin puede realizarse a mujeres de cualquier edad cuyos resultados de la prueba de Papanicolaou no sean claros.  Es posible que otros mdicos no recomienden exmenes de deteccin a mujeres no embarazadas que se consideran sujetos de bajo riesgo de Chief Financial Officer de pelvis y que no tienen sntomas. Pregntele al mdico si un examen plvico de deteccin es adecuado para usted.  Si ha recibido un tratamiento para Science writer cervical o una enfermedad que podra causar cncer, necesitar realizarse una prueba de Papanicolaou y controles durante al menos 20 aos de concluido el [place]. Si no se  ha hecho el Papanicolaou con regularidad, debern volver a evaluarse los factores de riesgo (como tener un nuevo compaero sexual), para Teacher, adult education si debe realizarse los estudios nuevamente. Algunas mujeres sufren problemas mdicos que aumentan la probabilidad de Museum/gallery curator cncer de cuello del tero. En estos casos, el mdico podr [employer] se realicen controles y pruebas de Papanicolaou con ms frecuencia. Cncer colorrectal  Este tipo de cncer puede detectarse y a menudo prevenirse.  Por lo general, los estudios de rutina se deben Medical laboratory scientific officer a Field seismologist a Proofreader de los 61 aos y [place] 71 aos.  Sin embargo, el mdico podr aconsejarle que lo haga antes, si tiene factores de riesgo para el cncer de colon.  Tambin puede recomendarle que use un kit de prueba para Hydrologist en la materia fecal.  Es posible que se use una pequea cmara en el extremo de un tubo para examinar directamente el colon (sigmoidoscopia o colonoscopia) a fin de Hydrographic surveyor formas tempranas de cncer colorrectal.  Los exmenes de rutina generalmente comienzan a los 64aos.  El examen directo del colon se debe repetir cada 5 a 10aos hasta los 75aos. Sin embargo, es posible que se realicen exmenes con mayor frecuencia, si se detectan formas tempranas de plipos precancerosos o pequeos bultos. Cncer de piel  Revise la piel  de la cabeza a los pies con regularidad.  Informe a su mdico si aparecen nuevos lunares o los que tiene se modifican, especialmente en su forma y color.  Tambin notifique al mdico si tiene un lunar que es ms grande que el tamao de una goma de lpiz.  Siempre use pantalla solar. Aplique pantalla solar de Kerry Dory y repetida a lo largo del Training and development officer.  Protjase usando mangas y [employer], un sombrero de ala ancha y gafas para el sol, siempre que se encuentre en el exterior. ENFERMEDADES CARDACAS, DIABETES E HIPERTENSIN ARTERIAL  La hipertensin arterial causa  enfermedades cardacas y [place] el riesgo de ictus. La hipertensin arterial es ms probable en los siguientes casos: ? Las personas que tienen la presin arterial en el extremo del rango normal (100-139/85-89 mm Hg). ? [employer] con sobrepeso u obesidad. ? Scientist, water quality.  Si usted tiene entre 18 y 39 aos, debe medirse la presin arterial cada 3 a 5 aos. Si usted tiene 40 aos o ms, debe medirse la presin arterial [employer]. Debe medirse la presin arterial dos veces: una vez cuando est en un hospital o una clnica y la otra vez cuando est en otro sitio. Registre el promedio de [employer]. Para controlar su presin arterial cuando no est en un hospital o [place], puede usar lo siguiente: ? Jorje Guild automtica para medir la presin arterial en una farmacia. ? Un monitor para medir la presin arterial en el hogar.  Si tiene entre 28 y 38 aos, consulte a su mdico si debe tomar aspirina para prevenir el ictus.  Realcese exmenes de deteccin de la diabetes con regularidad. Esto incluye la toma de [place] de sangre para controlar el nivel de azcar en la sangre durante el [place]. ? Si tiene un peso normal y un bajo riesgo de padecer diabetes, realcese este anlisis cada tres aos despus de los 45aos. ? Si tiene sobrepeso y un alto riesgo de padecer diabetes, considere someterse a este anlisis antes o con mayor frecuencia. PREVENCIN DE INFECCIONES HepatitisB  Si tiene un riesgo ms alto de Museum/gallery curator hepatitis B, debe someterse a un examen de deteccin de este virus. Se considera que tiene un alto riesgo de contraer hepatitis B si: ? Naci en un pas donde la hepatitis B es frecuente. Pregntele a su mdico qu pases son considerados de Public affairs consultant. ? Sus padres nacieron en un pas de alto riesgo y usted no recibi una vacuna que lo proteja contra la hepatitis B (vacuna contra la hepatitis B). ? [place]. ? [place] agujas para inyectarse  drogas. ? Vive con alguien que tiene hepatitis B. ? Ha tenido sexo con alguien que tiene hepatitis B. ? Recibe tratamiento de hemodilisis. ? Toma ciertos medicamentos para el cncer, trasplante de rganos y afecciones autoinmunitarias. Hepatitis C  Se recomienda un anlisis de [place] para: ? [employer] 1945 y 1965. ? Todas las personas que tengan un riesgo de haber contrado hepatitis C. Enfermedades de transmisin sexual (ETS).  Debe realizarse pruebas de deteccin de enfermedades de transmisin sexual (ETS), incluidas gonorrea y clamidia si: ? Es sexualmente activo y es menor de 62IRS. ? Es mayor de 24aos, y Investment banker, operational informa que corre riesgo de tener este tipo de infecciones. ? La actividad sexual ha cambiado desde que le hicieron la ltima prueba de deteccin y tiene un riesgo mayor de Best boy clamidia o Radio broadcast assistant. Pregntele al mdico si usted  tiene riesgo.  Si no tiene el VIH, pero corre riesgo de infectarse por el virus, se recomienda tomar diariamente un medicamento recetado para evitar la infeccin. Esto se conoce como profilaxis previa a la exposicin. Se considera que est en riesgo si: ? Es [place] sexualmente y no [place] preservativos habitualmente o no conoce el estado del VIH de sus Advertising copywriter. ? Se inyecta drogas. ? Es [place] sexualmente con Ardelia Mems pareja que tiene VIH. Consulte a su mdico para saber si tiene un alto riesgo de infectarse por el VIH. Si opta por comenzar la profilaxis previa a la exposicin, primero debe realizarse anlisis de deteccin del VIH. Luego, le harn anlisis cada 85mses mientras est tomando los medicamentos para la profilaxis previa a la exposicin. [facility] Si es premenopusica y puede quedar eWhite solicite a su mdico asesoramiento previo a la concepcin.  Si puede quedar embarazada, tome 400 a [id number]([id number]) de cido fAnheuser-Busch  Si desea evitar el embarazo, hable con su mdico sobre el  control de la natalidad (anticoncepcin). OSTEOPOROSIS Y MENOPAUSIA  La osteoporosis es una enfermedad en la que los huesos pierden los minerales y la fuerza por el avance de la edad. El resultado pueden ser fracturas graves en los hGlenaire El riesgo de osteoporosis puede identificarse con uArdelia Memsprueba de densidad sea.  Si tiene 65aos o ms, o si est en riesgo de sufrir osteoporosis y fracturas, pregunte a su mdico si debe someterse a exmenes.  Consulte a su mdico si debe tomar un suplemento de calcio o de vitamina D para reducir el riesgo de osteoporosis.  La menopausia puede presentar ciertos sntomas fsicos y rGaffer  La terapia de reemplazo hormonal puede reducir algunos de estos sntomas y rGaffer Consulte a su mdico para saber si la terapia de reemplazo hormonal es conveniente para usted. INSTRUCCIONES PARA EL CUIDADO EN EL HOGAR  Realcese los estudios de rutina de la salud, dentales y de lPublic librarian  MPitt[place]  No consuma ningn producto que contenga tabaco, lo que incluye cigarrillos, tabaco de mHigher education careers advisero cPsychologist, sport and exercise  Si est embarazada, no beba alcohol.  Si est amamantando, reduzca el consumo de alcohol y la frecuencia con la que consume.  Si es mujer y no est embarazada limite el consumo de alcohol a no ms de 1 medida por da. Una medida equivale a 12onzas de cerveza, 5onzas de vino o 1onzas de bebidas alcohlicas de alta graduacin.  No consuma drogas.  No comparta agujas.  Solicite ayuda a su mdico si necesita apoyo o informacin para abandonar las drogas.  Informe a su mdico si a menudo se siente deprimido.  Notifique a su mdico si alguna vez ha sido vctima de abuso o si no se siente seguro en su hogar. Esta informacin no tiene cMarine scientistel consejo del mdico. Asegrese de hacerle al mdico cualquier pregunta que tenga. Document Released: 02/06/2011 Document Revised: 03/10/2014 Document Reviewed:  11/21/2014 Elsevier Interactive Patient Education  2019 [employer]Reynolds [employer]

## 2018-03-31 NOTE — Progress Notes (Signed)
Dana Daniels 73 y.o.   Chief Complaint  Patient presents with  . Diabetes    HISTORY OF PRESENT ILLNESS: This is a 73 y.o. female concerned about diabetes.  Has history of hypertension and dyslipidemia.  Here for follow-up.  Family history of diabetes.  Has some urinary frequency but denies excessive thirst.  Has been intentionally losing weight by adjusting her diet. Wt Readings from Last 3 Encounters:  03/31/18 156 lb (70.8 kg)  02/02/18 161 lb 3.2 oz (73.1 kg)  01/05/18 161 lb 3.2 oz (73.1 kg)    No other significant symptoms.  Otherwise doing well. HPI   Prior to Admission medications   Medication Sig Start Date End Date Taking? Authorizing Provider  albuterol (PROVENTIL HFA;VENTOLIN HFA) 108 (90 Base) MCG/ACT inhaler Inhale 2 puffs into the lungs every 4 (four) hours as needed for wheezing or shortness of breath. 05/29/17  Yes Parrett, Tammy S, NP  Cholecalciferol (VITAMIN D3) 10000 units TABS Take by mouth.   Yes [provider]  cromolyn (NASALCROM) 5.2 MG/ACT nasal spray Place 1 spray into both nostrils 2 (two) times daily. 02/02/18  Yes Rayana Geurin, Ines Bloomer, MD  fluticasone furoate-vilanterol (BREO ELLIPTA) 100-25 MCG/INH AEPB Inhale 1 puff into the lungs daily. 09/10/17  Yes Rigoberto Noel, MD  HYDROcodone-acetaminophen (NORCO) 5-325 MG tablet Take 1 tablet by mouth every 6 (six) hours as needed for moderate pain. 09/25/17  Yes Shawnee Knapp, MD  HYDROcodone-acetaminophen (NORCO) 5-325 MG tablet Take 1 tablet by mouth every 6 (six) hours as needed for moderate pain. 02/02/18  Yes Quantavious Eggert, Ines Bloomer, MD  nitroGLYCERIN (NITROSTAT) 0.4 MG SL tablet Place 0.4 mg under the tongue every 5 (five) minutes as needed for chest pain.   Yes [provider]  valACYclovir (VALTREX) 1000 MG tablet Take 1 tablet (1,000 mg total) by mouth 2 (two) times daily. 09/25/17  Yes Lliam Hoh, Ines Bloomer, MD  cetirizine (ZYRTEC) 10 MG tablet Take 1 tablet (10 mg total) by mouth  daily for 7 days. 02/02/18 02/09/18  Horald Pollen, MD  diltiazem (CARDIZEM CD) 240 MG 24 hr capsule Take 1 capsule (240 mg total) by mouth daily. 09/25/17 12/24/17  Horald Pollen, MD  montelukast (SINGULAIR) 10 MG tablet Take 1 tablet (10 mg total) by mouth at bedtime. 09/25/17 12/24/17  Horald Pollen, MD  omeprazole (PRILOSEC) 20 MG capsule Take 1 capsule (20 mg total) by mouth daily. 09/25/17 12/24/17  Horald Pollen, MD  simvastatin (ZOCOR) 20 MG tablet Take 1 tablet (20 mg total) by mouth daily. Office visit needed for 90 day supply 09/25/17 12/24/17  Horald Pollen, MD    Allergies  Allergen Reactions  . Tramadol Itching    Patient Active Problem List   Diagnosis Date Noted  . Foot lesion 11/09/2017  . History of gastroesophageal reflux (GERD) 09/25/2017  . Herpes zoster without complication 92/42/6834  . Facial pain 09/25/2017  . Dyspnea 05/29/2017  . Osteoarthritis of multiple joints 03/24/2017  . Chronic pain of left knee 03/24/2017  . Chronic obstructive pulmonary disease ([place]) 03/24/2017  . Primary cancer of skin of left foot 02/03/2017  . Mild intermittent asthma without complication 19/62/2297  . Sleep apnea 02/03/2017  . Dyslipidemia 02/03/2017  . Migraine 02/03/2017  . Essential hypertension 09/18/2008  . Allergic rhinitis 09/18/2008  . Mild intermittent asthma with acute exacerbation 09/18/2008  . COUGH 09/18/2008    Past Medical History:  Diagnosis Date  . Allergy   . Arthritis   .  Asthma   . Blood transfusion without reported diagnosis   . Cataract   . Hyperlipidemia   . Hypertension   . Migraine   . Osteoporosis   . Sleep apnea     Past Surgical History:  Procedure Laterality Date  . ABDOMINAL HYSTERECTOMY    . CHOLECYSTECTOMY    . EYE SURGERY Left    cataract   . FOOT SURGERY Left     Social History   Socioeconomic History  . Marital status: Widowed    Spouse name: Not on file  . Number of children: Not  on file  . Years of education: Not on file  . Highest education level: Not on file  Occupational History  . Not on file  Social Needs  . Financial resource strain: Not on file  . Food insecurity:    Worry: Not on file    Inability: Not on file  . Transportation needs:    Medical: Not on file    Non-medical: Not on file  Tobacco Use  . Smoking status: Never Smoker  . Smokeless tobacco: Never Used  Substance and Sexual Activity  . Alcohol use: No  . Drug use: Never  . Sexual activity: Not on file  Lifestyle  . Physical activity:    Days per week: Not on file    Minutes per session: Not on file  . Stress: Not on file  Relationships  . Social connections:    Talks on phone: Not on file    Gets together: Not on file    Attends religious service: Not on file    Active member of club or organization: Not on file    Attends meetings of clubs or organizations: Not on file    Relationship status: Not on file  . Intimate partner violence:    Fear of current or ex partner: Not on file    Emotionally abused: Not on file    Physically abused: Not on file    Forced sexual activity: Not on file  Other Topics Concern  . Not on file  Social History Narrative  . Not on file    Family History  Problem Relation Age of Onset  . Heart disease Mother   . Hyperlipidemia Mother   . Hypertension Mother   . Stroke Mother   . Heart disease Father   . Stroke Father   . Hypertension Son   . Hyperlipidemia Son   . Fibromyalgia Son   . Lupus Son   . Hypertension Son   . Diabetes Daughter   . Hypertension Daughter      Review of Systems  Constitutional: Negative.  Negative for chills and fever.  HENT: Negative.  Negative for congestion, nosebleeds, sinus pain and sore throat.   Eyes: Negative.  Negative for blurred vision and double vision.  Respiratory: Negative.  Negative for cough and shortness of breath.   Cardiovascular: Negative.  Negative for chest pain and palpitations.   Gastrointestinal: Negative.  Negative for abdominal pain, blood in stool, diarrhea, melena, nausea and vomiting.  Genitourinary: Negative.  Negative for dysuria and hematuria.  Musculoskeletal: Positive for joint pain (Occasional left hip pain). Negative for myalgias and neck pain.  Skin: Negative.  Negative for rash.  Neurological: Negative.  Negative for dizziness and headaches.  Endo/Heme/Allergies: Negative.   All other systems reviewed and are negative.  Vitals:   03/31/18 1521  BP: (!) 145/74  Pulse: 80  Resp: 17  Temp: 98.9 F (37.2 C)  SpO2:  98%     Physical Exam Vitals signs reviewed.  Constitutional:      Appearance: Normal appearance.  HENT:     Head: Normocephalic and atraumatic.     Mouth/Throat:     Mouth: Mucous membranes are moist.     Pharynx: Oropharynx is clear.  Eyes:     Extraocular Movements: Extraocular movements intact.     Conjunctiva/sclera: Conjunctivae normal.     Pupils: Pupils are equal, round, and reactive to light.  Neck:     Musculoskeletal: Normal range of motion and neck supple.  Cardiovascular:     Rate and Rhythm: Normal rate and regular rhythm.     Heart sounds: Normal heart sounds.  Pulmonary:     Effort: Pulmonary effort is normal.     Breath sounds: Normal breath sounds.  Abdominal:     General: There is no distension.     Palpations: Abdomen is soft.     Tenderness: There is no abdominal tenderness.  Musculoskeletal: Normal range of motion.  Skin:    General: Skin is warm and dry.  Neurological:     General: No focal deficit present.     Mental Status: She is alert and oriented to person, place, and time.  Psychiatric:        Mood and Affect: Mood normal.        Behavior: Behavior normal.      ASSESSMENT & PLAN: Tanny was seen today for diabetes.  Diagnoses and all orders for this visit:  Essential hypertension -     Comprehensive metabolic panel -     Hemoglobin A1c -     Lipid panel -     diltiazem  (CARDIZEM CD) 240 MG 24 hr capsule; Take 1 capsule (240 mg total) by mouth daily.  Dyslipidemia -     Comprehensive metabolic panel -     Hemoglobin A1c -     Lipid panel -     simvastatin (ZOCOR) 20 MG tablet; Take 1 tablet (20 mg total) by mouth daily.  Seasonal allergies -     cetirizine (ZYRTEC) 10 MG tablet; Take 1 tablet (10 mg total) by mouth daily for 7 days.  History of gastroesophageal reflux (GERD) -     omeprazole (PRILOSEC) 20 MG capsule; Take 1 capsule (20 mg total) by mouth daily.  Other orders -     montelukast (SINGULAIR) 10 MG tablet; Take 1 tablet (10 mg total) by mouth at bedtime.   Patient Instructions  Mantenimiento de la salud - Mujeres (Health Maintenance, Female) Un estilo de vida saludable y los cuidados preventivos pueden favorecer considerablemente a la salud y Musician. Pregunte a su mdico cul es el cronograma de exmenes peridicos apropiado para usted. Esta es una buena oportunidad para consultarlo sobre cmo prevenir enfermedades y [place] sano. Adems de los controles, hay muchas otras cosas que puede hacer usted mismo. Los expertos han realizado numerosas investigaciones [employer] cambios en el estilo de vida y las medidas de prevencin que, [place], lo ayudarn a mantenerse sano. Solicite a su mdico ms informacin. EL PESO Y LA DIETA Consuma una dieta saludable.  Asegrese de [employer] verduras, frutas, productos lcteos de bajo contenido de [place] y Advertising account planner.  No consuma muchos alimentos de alto contenido de grasas slidas, azcares agregados o sal.  Realice actividad fsica con regularidad. Esta es una de las prcticas ms importantes que puede hacer por su salud. ? La Delorise Shiner de los adultos deben hacer ejercicio durante  al menos 191mnutos por semana. El ejercicio debe aumentar la frecuencia cardaca y pActorla transpiracin (ejercicio de [place]. ? La mayora de los adultos tambin deben hacer  ejercicios de elongacin al mToysRusveces a la semana. Agregue esto al su plan de ejercicio de intensidad moderada. Mantenga un peso saludable.  El ndice de masa corporal ([facility] es una medida que puede utilizarse para identificar posibles problemas de pCarlton Proporciona una estimacin de la grasa corporal basndose en el peso y la altura. Su mdico puede ayudarle a dRadiation protection practitionerIEdmundsony a lScientist, forensico mTheatre managerun peso saludable.  Para las mujeres de 20aos o ms: ? Un IAspire Health Partners Incmenor de 18,5 se considera bajo peso. ? Un IAstra Regional Medical And Cardiac Centerentre 18,5 y 24,9 es normal. ? Un IColumbus Community Hospitalentre 25 y 29,9 se considera sobrepeso. ? Un IMC de 30 o ms se considera obesidad. Observe los niveles de colesterol y lpidos en la sangre.  Debe comenzar a rEnglish as a second language teacherde lpidos y cResearch officer, trade unionen la sangre a los 20aos y luego repetirlos cada 511aos  Es posible que nAutomotive engineerlos niveles de colesterol con mayor frecuencia si: ? Sus niveles de lpidos y colesterol son altos. ? Es mayor de [id number] ? Presenta un alto riesgo de padecer enfermedades cardacas. DETECCIN DE CNCER Cncer de pulmn  Se recomienda realizar exmenes de deteccin de cncer de pulmn a personas adultas entre 518y 852aos que estn en riesgo de dHorticulturist, commercialde pulmn por sus antecedentes de consumo de tabaco.  Se recomienda una tomografa computarizada de baja dosis de los pulmones todos los aos a las personas que: ? Fuman actualmente. ? Hayan dejado el hbito en algn momento en los ltimos 15aos. ? Hayan fumado durante 30aos un paquete diario. Un paquete-ao equivale a fumar un promedio de un paquete de cigarrillos diario durante un ao.  Los exmenes de deteccin anuales deben continuar hasta que hayan pasado 15aos desde que dej de fumar.  Ya no debern realizarse si tiene un problema de salud que le impida recibir tratamiento para eScience writerde pulmn. Cncer de mama  Practique la autoconciencia de la mama. Esto significa  reconocer la apariencia normal de sus mamas y cmo las siente.  Tambin significa realizar autoexmenes regulares de lJohnson & Johnson Informe a su mdico sobre cualquier cambio, sin importar cun pequeo sea.  Si tiene entre 20 y 358aos, un mdico debe realizarle un examen clnico de las mamas como parte del examen regular de s[place] cada 1 a 3aos.  Si tiene 40aos o ms, debe rInformation systems managerclnico de las mMicrosoft Tambin considere realizarse una rGl[place](mDobson todos los aPowell[place]  Si tiene antecedentes familiares de cncer de mama, hable con su mdico para someterse a un estudio gentico.  Si tiene alto riesgo de pChief Financial Officerde mama, hable con su mdico para someterse a uPublic house managery u3M Company  La evaluacin del gen del cncer de mama (BRCA) se recomienda a mujeres que tengan familiares con cnceres relacionados con el BRCA. Los cnceres relacionados con el BRCA incluyen los siguientes: ? MBelding ? Ovario. ? Trompas. ? Cnceres de peritoneo.  Los resultados de la evaluacin determinarn la necesidad de asesoramiento gentico y de aLake Robertsde BRCA1 y BRCA2. Cncer de cuello del tero El mdico puede recomendarle que se haga pruebas peridicas de deteccin de cncer de los rganos de la pelvis (ovarios, tero y vagina). Estas pruebas incluyen un examen plvico, que  abarca controlar si se produjeron cambios microscpicos en la superficie del cuello del tero (prueba de Papanicolaou). Pueden recomendarle que se haga estas pruebas cada 3aos, a partir de los 21aos.  A las mujeres que tienen entre 30 y 28aos, los mdicos pueden recomendarles que se sometan a exmenes plvicos y pruebas de Papanicolaou cada 24aos, o a la prueba de Papanicolaou y el examen plvico en combinacin con estudios de deteccin del virus del papiloma humano (VPH) cada 5aos. Algunos tipos de VPH aumentan el riesgo de Chief Financial Officer de cuello del  tero. La prueba para la deteccin del VPH tambin puede realizarse a mujeres de cualquier edad cuyos resultados de la prueba de Papanicolaou no sean claros.  Es posible que otros mdicos no recomienden exmenes de deteccin a mujeres no embarazadas que se consideran sujetos de bajo riesgo de Chief Financial Officer de pelvis y que no tienen sntomas. Pregntele al mdico si un examen plvico de deteccin es adecuado para usted.  Si ha recibido un tratamiento para Science writer cervical o una enfermedad que podra causar cncer, necesitar realizarse una prueba de Papanicolaou y controles durante al menos 17 aos de concluido el [place]. Si no se ha hecho el Papanicolaou con regularidad, debern volver a evaluarse los factores de riesgo (como tener un nuevo compaero sexual), para Teacher, adult education si debe realizarse los estudios nuevamente. Algunas mujeres sufren problemas mdicos que aumentan la probabilidad de Museum/gallery curator cncer de cuello del tero. En estos casos, el mdico podr [employer] se realicen controles y pruebas de Papanicolaou con ms frecuencia. Cncer colorrectal  Este tipo de cncer puede detectarse y a menudo prevenirse.  Por lo general, los estudios de rutina se deben Medical laboratory scientific officer a Field seismologist a Proofreader de los 50 aos y [place] 30 aos.  Sin embargo, el mdico podr aconsejarle que lo haga antes, si tiene factores de riesgo para el cncer de Daniels.  Tambin puede recomendarle que use un kit de prueba para Hydrologist en la materia fecal.  Es posible que se use una pequea cmara en el extremo de un tubo para examinar directamente el Daniels (sigmoidoscopia o colonoscopia) a fin de Hydrographic surveyor formas tempranas de cncer colorrectal.  Los exmenes de rutina generalmente comienzan a los 67aos.  El examen directo del Daniels se debe repetir cada 5 a 10aos hasta los 75aos. Sin embargo, es posible que se realicen exmenes con mayor frecuencia, si se detectan formas tempranas de plipos precancerosos o  pequeos bultos. Cncer de piel  Revise la piel de la cabeza a los pies con regularidad.  Informe a su mdico si aparecen nuevos lunares o los que tiene se modifican, especialmente en su forma y color.  Tambin notifique al mdico si tiene un lunar que es ms grande que el tamao de una goma de lpiz.  Siempre use pantalla solar. Aplique pantalla solar de Kerry Dory y repetida a lo largo del Training and development officer.  Protjase usando mangas y [employer], un sombrero de ala ancha y gafas para el sol, siempre que se encuentre en el exterior. ENFERMEDADES CARDACAS, DIABETES E HIPERTENSIN ARTERIAL  La hipertensin arterial causa enfermedades cardacas y [place] el riesgo de ictus. La hipertensin arterial es ms probable en los siguientes casos: ? Las personas que tienen la presin arterial en el extremo del rango normal (100-139/85-89 mm Hg). ? [employer] con sobrepeso u obesidad. ? Scientist, water quality.  Si usted tiene entre 18 y 39 aos, debe medirse la presin arterial cada 3 a 5 aos. Si usted tiene  38 aos o ms, debe medirse la presin arterial todos los aos. Debe medirse la presin arterial dos veces: una vez cuando est en un hospital o una clnica y la otra vez cuando est en otro sitio. Registre el promedio de [employer]. Para controlar su presin arterial cuando no est en un hospital o [place], puede usar lo siguiente: ? Jorje Guild automtica para medir la presin arterial en una farmacia. ? Un monitor para medir la presin arterial en el hogar.  Si tiene entre 16 y 14 aos, consulte a su mdico si debe tomar aspirina para prevenir el ictus.  Realcese exmenes de deteccin de la diabetes con regularidad. Esto incluye la toma de [place] de sangre para controlar el nivel de azcar en la sangre durante el [place]. ? Si tiene un peso normal y un bajo riesgo de padecer diabetes, realcese este anlisis cada tres aos despus de los 45aos. ? Si tiene sobrepeso y un  alto riesgo de padecer diabetes, considere someterse a este anlisis antes o con mayor frecuencia. PREVENCIN DE INFECCIONES HepatitisB  Si tiene un riesgo ms alto de Museum/gallery curator hepatitis B, debe someterse a un examen de deteccin de este virus. Se considera que tiene un alto riesgo de contraer hepatitis B si: ? Naci en un pas donde la hepatitis B es frecuente. Pregntele a su mdico qu pases son considerados de Public affairs consultant. ? Sus padres nacieron en un pas de alto riesgo y usted no recibi una vacuna que lo proteja contra la hepatitis B (vacuna contra la hepatitis B). ? [place]. ? [place] agujas para inyectarse drogas. ? Vive con alguien que tiene hepatitis B. ? Ha tenido sexo con alguien que tiene hepatitis B. ? Recibe tratamiento de hemodilisis. ? Toma ciertos medicamentos para el cncer, trasplante de rganos y afecciones autoinmunitarias. Hepatitis C  Se recomienda un anlisis de [place] para: ? [employer] 1945 y 1965. ? Todas las personas que tengan un riesgo de haber contrado hepatitis C. Enfermedades de transmisin sexual (ETS).  Debe realizarse pruebas de deteccin de enfermedades de transmisin sexual (ETS), incluidas gonorrea y clamidia si: ? Es sexualmente activo y es menor de 22GUR. ? Es mayor de 24aos, y Investment banker, operational informa que corre riesgo de tener este tipo de infecciones. ? La actividad sexual ha cambiado desde que le hicieron la ltima prueba de deteccin y tiene un riesgo mayor de Best boy clamidia o Radio broadcast assistant. Pregntele al mdico si usted tiene riesgo.  Si no tiene el VIH, pero corre riesgo de infectarse por el virus, se recomienda tomar diariamente un medicamento recetado para evitar la infeccin. Esto se conoce como profilaxis previa a la exposicin. Se considera que est en riesgo si: ? Es [place] sexualmente y no [place] preservativos habitualmente o no conoce el estado del VIH de sus Advertising copywriter. ? Se inyecta drogas. ? Es [place]  sexualmente con Ardelia Mems pareja que tiene VIH. Consulte a su mdico para saber si tiene un alto riesgo de infectarse por el VIH. Si opta por comenzar la profilaxis previa a la exposicin, primero debe realizarse anlisis de deteccin del VIH. Luego, le harn anlisis cada 14mses mientras est tomando los medicamentos para la profilaxis previa a la exposicin. [facility] Si es premenopusica y puede quedar eMar[place] solicite a su mdico asesoramiento previo a la concepcin.  Si puede quedar embarazada, tome 400 a [id number](mcg) de cido fAnheuser-Busch  Si desea evitar el embarazo, hable con  su mdico sobre el control de la natalidad (anticoncepcin). OSTEOPOROSIS Y MENOPAUSIA  La osteoporosis es una enfermedad en la que los huesos pierden los minerales y la fuerza por el avance de la edad. El resultado pueden ser fracturas graves en los [place]. El riesgo de osteoporosis puede identificarse con Ardelia Mems prueba de densidad sea.  Si tiene 65aos o ms, o si est en riesgo de sufrir osteoporosis y fracturas, pregunte a su mdico si debe someterse a exmenes.  Consulte a su mdico si debe tomar un suplemento de calcio o de vitamina D para reducir el riesgo de osteoporosis.  La menopausia puede presentar ciertos sntomas fsicos y [place].  La terapia de reemplazo hormonal puede reducir algunos de estos sntomas y Gaffer. Consulte a su mdico para saber si la terapia de reemplazo hormonal es conveniente para usted. INSTRUCCIONES PARA EL CUIDADO EN EL HOGAR  Realcese los estudios de rutina de la salud, dentales y de Public librarian.  [place].  No consuma ningn producto que contenga tabaco, lo que incluye cigarrillos, tabaco de Higher education careers adviser o Psychologist, sport and exercise.  Si est embarazada, no beba alcohol.  Si est amamantando, reduzca el consumo de alcohol y la frecuencia con la que consume.  Si es mujer y no est embarazada limite el consumo de alcohol a no ms de 1  medida por da. Una medida equivale a 12onzas de cerveza, 5onzas de vino o 1onzas de bebidas alcohlicas de alta graduacin.  No consuma drogas.  No comparta agujas.  Solicite ayuda a su mdico si necesita apoyo o informacin para abandonar las drogas.  Informe a su mdico si a menudo se siente deprimido.  Notifique a su mdico si alguna vez ha sido vctima de abuso o si no se siente seguro en su hogar. Esta informacin no tiene Marine scientist el consejo del mdico. Asegrese de hacerle al mdico cualquier pregunta que tenga. Document Released: 02/06/2011 Document Revised: 03/10/2014 Document Reviewed: 11/21/2014 Elsevier Interactive Patient Education  2019 Elsevier Inc.      Agustina Caroli, MD [facility]

## 2018-04-01 LAB — LIPID PANEL
Chol/HDL Ratio: 4.1 ratio (ref 0.0–4.4)
Cholesterol, Total: 221 mg/dL — ABNORMAL HIGH (ref 100–199)
HDL: 54 mg/dL (ref >39)
LDL Calculated: 132 mg/dL — ABNORMAL HIGH (ref 0–99)
Triglycerides: 173 mg/dL — ABNORMAL HIGH (ref 0–149)
VLDL Cholesterol Cal: 35 mg/dL (ref 5–40)

## 2018-04-01 LAB — HEMOGLOBIN A1C
Est. average glucose Bld gHb Est-mCnc: 117 mg/dL
Hgb A1c MFr Bld: 5.7 % — ABNORMAL HIGH (ref 4.8–5.6)

## 2018-04-01 LAB — COMPREHENSIVE METABOLIC PANEL
ALBUMIN: 4.4 g/dL (ref 3.8–4.8)
ALK PHOS: 99 IU/L (ref 39–117)
ALT: 15 IU/L (ref 0–32)
AST: 16 IU/L (ref 0–40)
Albumin/Globulin Ratio: 1.6 (ref 1.2–2.2)
BUN/Creatinine Ratio: 11 — ABNORMAL LOW (ref 12–28)
BUN: 8 mg/dL (ref 8–27)
Bilirubin Total: 0.3 mg/dL (ref 0.0–1.2)
CO2: 24 mmol/L (ref 20–29)
Calcium: 10 mg/dL (ref 8.7–10.3)
Chloride: 102 mmol/L (ref 96–106)
Creatinine, Ser: 0.74 mg/dL (ref 0.57–1.00)
GFR calc Af Amer: 95 mL/min/{1.73_m2} (ref >59)
GFR calc non Af Amer: 82 mL/min/{1.73_m2} (ref >59)
Globulin, Total: 2.8 g/dL (ref 1.5–4.5)
Glucose: 88 mg/dL (ref 65–99)
Potassium: 4.5 mmol/L (ref 3.5–5.2)
Sodium: 142 mmol/L (ref 134–144)
Total Protein: 7.2 g/dL (ref 6.0–8.5)

## 2018-04-02 ENCOUNTER — Encounter: Payer: Self-pay | Admitting: Emergency Medicine

## 2018-04-18 DIAGNOSIS — G4733 Obstructive sleep apnea (adult) (pediatric): Secondary | ICD-10-CM | POA: Diagnosis not present

## 2018-05-17 DIAGNOSIS — G4733 Obstructive sleep apnea (adult) (pediatric): Secondary | ICD-10-CM | POA: Diagnosis not present

## 2018-06-08 DIAGNOSIS — G4733 Obstructive sleep apnea (adult) (pediatric): Secondary | ICD-10-CM | POA: Diagnosis not present

## 2018-06-09 ENCOUNTER — Other Ambulatory Visit: Payer: Self-pay | Admitting: *Deleted

## 2018-06-09 MED ORDER — FLUTICASONE FUROATE-VILANTEROL 100-25 MCG/INH IN AEPB: 1.0000 | INHALATION_SPRAY | Freq: Every day | RESPIRATORY_TRACT | 1 refills | Status: DC

## 2018-06-17 DIAGNOSIS — G4733 Obstructive sleep apnea (adult) (pediatric): Secondary | ICD-10-CM | POA: Diagnosis not present

## 2018-07-17 DIAGNOSIS — G4733 Obstructive sleep apnea (adult) (pediatric): Secondary | ICD-10-CM | POA: Diagnosis not present

## 2018-08-17 DIAGNOSIS — G4733 Obstructive sleep apnea (adult) (pediatric): Secondary | ICD-10-CM | POA: Diagnosis not present

## 2018-09-02 DIAGNOSIS — G4733 Obstructive sleep apnea (adult) (pediatric): Secondary | ICD-10-CM | POA: Diagnosis not present

## 2018-09-16 DIAGNOSIS — G4733 Obstructive sleep apnea (adult) (pediatric): Secondary | ICD-10-CM | POA: Diagnosis not present

## 2018-10-05 ENCOUNTER — Ambulatory Visit: Payer: Medicare HMO | Admitting: Emergency Medicine

## 2018-10-17 DIAGNOSIS — G4733 Obstructive sleep apnea (adult) (pediatric): Secondary | ICD-10-CM | POA: Diagnosis not present

## 2018-11-16 ENCOUNTER — Ambulatory Visit (INDEPENDENT_AMBULATORY_CARE_PROVIDER_SITE_OTHER): Payer: Medicare HMO | Admitting: Emergency Medicine

## 2018-11-16 ENCOUNTER — Other Ambulatory Visit: Payer: Self-pay

## 2018-11-16 DIAGNOSIS — Z20822 Contact with and (suspected) exposure to covid-19: Secondary | ICD-10-CM

## 2018-11-16 DIAGNOSIS — Z23 Encounter for immunization: Secondary | ICD-10-CM

## 2018-11-16 DIAGNOSIS — R6889 Other general symptoms and signs: Secondary | ICD-10-CM | POA: Diagnosis not present

## 2018-11-17 DIAGNOSIS — G4733 Obstructive sleep apnea (adult) (pediatric): Secondary | ICD-10-CM | POA: Diagnosis not present

## 2018-11-18 LAB — NOVEL CORONAVIRUS, NAA: SARS-CoV-2, NAA: NOT DETECTED

## 2019-01-13 ENCOUNTER — Other Ambulatory Visit: Payer: Self-pay

## 2019-01-13 ENCOUNTER — Encounter: Payer: Self-pay | Admitting: Emergency Medicine

## 2019-01-13 ENCOUNTER — Ambulatory Visit (INDEPENDENT_AMBULATORY_CARE_PROVIDER_SITE_OTHER): Payer: Medicare HMO | Admitting: Emergency Medicine

## 2019-01-13 VITALS — BP 120/72 | HR 90 | Temp 99.3°F | Resp 16 | Ht 60.0 in | Wt 165.8 lb

## 2019-01-13 DIAGNOSIS — M25552 Pain in left hip: Secondary | ICD-10-CM | POA: Diagnosis not present

## 2019-01-13 DIAGNOSIS — J302 Other seasonal allergic rhinitis: Secondary | ICD-10-CM

## 2019-01-13 DIAGNOSIS — J449 Chronic obstructive pulmonary disease, unspecified: Secondary | ICD-10-CM | POA: Diagnosis not present

## 2019-01-13 MED ORDER — BREO ELLIPTA 100-25 MCG/INH IN AEPB: 1.0000 | INHALATION_SPRAY | Freq: Every day | RESPIRATORY_TRACT | 7 refills | Status: DC

## 2019-01-13 MED ORDER — CROMOLYN SODIUM 5.2 MG/ACT NA AERS: 1.0000 | INHALATION_SPRAY | Freq: Two times a day (BID) | NASAL | 12 refills | Status: DC

## 2019-01-13 MED ORDER — HYDROCODONE-ACETAMINOPHEN 5-325 MG PO TABS: 1.0000 | ORAL_TABLET | Freq: Four times a day (QID) | ORAL | 0 refills | Status: DC | PRN

## 2019-01-13 MED ORDER — ALBUTEROL SULFATE HFA 108 (90 BASE) MCG/ACT IN AERS: 2.0000 | INHALATION_SPRAY | RESPIRATORY_TRACT | 7 refills | Status: DC | PRN

## 2019-01-13 NOTE — Progress Notes (Signed)
Dana Daniels 73 y.o.   Chief Complaint  Patient presents with  . Hip Pain    x 2 weeks  . Medication Refill    Albuterol inhaler    HISTORY OF PRESENT ILLNESS: This is a 73 y.o. female with history of asthma/COPD, needs medication refills. Also has chronic left hip pain exacerbated 2 weeks ago.  Sharp localized constant pain with no radiation and no associated symptoms.  Better with rest and worse with movement. Denies injuries.  No other complaints or medical concerns today.  HPI   Prior to Admission medications   Medication Sig Start Date End Date Taking? Authorizing Provider  albuterol (PROVENTIL HFA;VENTOLIN HFA) 108 (90 Base) MCG/ACT inhaler Inhale 2 puffs into the lungs every 4 (four) hours as needed for wheezing or shortness of breath. 05/29/17  Yes Parrett, Tammy S, NP  cromolyn (NASALCROM) 5.2 MG/ACT nasal spray Place 1 spray into both nostrils 2 (two) times daily. 02/02/18  Yes Nyeem Stoke, Ines Bloomer, MD  fluticasone furoate-vilanterol (BREO ELLIPTA) 100-25 MCG/INH AEPB Inhale 1 puff into the lungs daily. 06/09/18  Yes Rigoberto Noel, MD  nitroGLYCERIN (NITROSTAT) 0.4 MG SL tablet Place 0.4 mg under the tongue every 5 (five) minutes as needed for chest pain.   Yes [provider]  cetirizine (ZYRTEC) 10 MG tablet Take 1 tablet (10 mg total) by mouth daily for 7 days. 03/31/18 04/07/18  Horald Pollen, MD  Cholecalciferol (VITAMIN D3) 10000 units TABS Take by mouth.    [provider]  diltiazem (CARDIZEM CD) 240 MG 24 hr capsule Take 1 capsule (240 mg total) by mouth daily. 03/31/18 06/29/18  Horald Pollen, MD  HYDROcodone-acetaminophen (NORCO) 5-325 MG tablet Take 1 tablet by mouth every 6 (six) hours as needed for moderate pain. Patient not taking: Reported on 01/13/2019 09/25/17   Shawnee Knapp, MD  HYDROcodone-acetaminophen [facility]) 5-325 MG tablet Take 1 tablet by mouth every 6 (six) hours as needed for moderate pain. Patient not taking:  Reported on 01/13/2019 02/02/18   Horald Pollen, MD  montelukast (SINGULAIR) 10 MG tablet Take 1 tablet (10 mg total) by mouth at bedtime. 03/31/18 06/29/18  Horald Pollen, MD  omeprazole (PRILOSEC) 20 MG capsule Take 1 capsule (20 mg total) by mouth daily. 03/31/18 06/29/18  Horald Pollen, MD  simvastatin (ZOCOR) 20 MG tablet Take 1 tablet (20 mg total) by mouth daily. 03/31/18 06/29/18  Horald Pollen, MD  valACYclovir (VALTREX) 1000 MG tablet Take 1 tablet (1,000 mg total) by mouth 2 (two) times daily. Patient not taking: Reported on 01/13/2019 09/25/17   Horald Pollen, MD    Allergies  Allergen Reactions  . Tramadol Itching    Patient Active Problem List   Diagnosis Date Noted  . History of gastroesophageal reflux (GERD) 09/25/2017  . Osteoarthritis of multiple joints 03/24/2017  . Chronic pain of left knee 03/24/2017  . Chronic obstructive pulmonary disease ([place]) 03/24/2017  . Primary cancer of skin of left foot 02/03/2017  . Mild intermittent asthma without complication [id number]  . Sleep apnea 02/03/2017  . Dyslipidemia 02/03/2017  . Migraine 02/03/2017  . Essential hypertension 09/18/2008  . Allergic rhinitis 09/18/2008    Past Medical History:  Diagnosis Date  . Allergy   . Arthritis   . Asthma   . Blood transfusion without reported diagnosis   . Cataract   . Hyperlipidemia   . Hypertension   . Migraine   . Osteoporosis   . Sleep apnea  Past Surgical History:  Procedure Laterality Date  . ABDOMINAL HYSTERECTOMY    . CHOLECYSTECTOMY    . EYE SURGERY Left    cataract   . FOOT SURGERY Left     Social History   Socioeconomic History  . Marital status: Widowed    Spouse name: Not on file  . Number of children: Not on file  . Years of education: Not on file  . Highest education level: Not on file  Occupational History  . Not on file  Social Needs  . Financial resource strain: Not on file  . Food insecurity    Worry:  Not on file    Inability: Not on file  . Transportation needs    Medical: Not on file    Non-medical: Not on file  Tobacco Use  . Smoking status: Never Smoker  . Smokeless tobacco: Never Used  Substance and Sexual Activity  . Alcohol use: No  . Drug use: Never  . Sexual activity: Not on file  Lifestyle  . Physical activity    Days per week: Not on file    Minutes per session: Not on file  . Stress: Not on file  Relationships  . Social Herbalist on phone: Not on file    Gets together: Not on file    Attends religious service: Not on file    Active member of club or organization: Not on file    Attends meetings of clubs or organizations: Not on file    Relationship status: Not on file  . Intimate partner violence    Fear of current or ex partner: Not on file    Emotionally abused: Not on file    Physically abused: Not on file    Forced sexual activity: Not on file  Other Topics Concern  . Not on file  Social History Narrative  . Not on file    Family History  Problem Relation Age of Onset  . Heart disease Mother   . Hyperlipidemia Mother   . Hypertension Mother   . Stroke Mother   . Heart disease Father   . Stroke Father   . Hypertension Son   . Hyperlipidemia Son   . Fibromyalgia Son   . Lupus Son   . Hypertension Son   . Diabetes Daughter   . Hypertension Daughter      Review of Systems  Constitutional: Negative.  Negative for chills and fever.  HENT: Negative for congestion and sore throat.   Respiratory: Positive for wheezing (Intermittent). Negative for cough, hemoptysis and sputum production.   Cardiovascular: Negative.  Negative for chest pain and palpitations.  Gastrointestinal: Negative.  Negative for abdominal pain, diarrhea, nausea and vomiting.  Genitourinary: Negative.  Negative for dysuria and hematuria.  Musculoskeletal: Positive for joint pain.  Skin: Negative.  Negative for rash.  Neurological: Negative for dizziness and  headaches.  Psychiatric/Behavioral: The patient has insomnia (Chronic).   All other systems reviewed and are negative.  Vitals:   01/13/19 1417  BP: 120/72  Pulse: 90  Resp: 16  Temp: 99.3 F (37.4 C)  SpO2: 97%     Physical Exam Vitals signs reviewed.  Constitutional:      Appearance: Normal appearance.  HENT:     Head: Normocephalic.  Eyes:     Extraocular Movements: Extraocular movements intact.     Conjunctiva/sclera: Conjunctivae normal.     Pupils: Pupils are equal, round, and reactive to light.  Neck:  Musculoskeletal: Normal range of motion and neck supple.  Cardiovascular:     Rate and Rhythm: Normal rate and regular rhythm.     Pulses: Normal pulses.     Heart sounds: Normal heart sounds.  Pulmonary:     Effort: Pulmonary effort is normal.     Breath sounds: Normal breath sounds.  Abdominal:     Palpations: Abdomen is soft.     Tenderness: There is no abdominal tenderness.  Musculoskeletal:     Lumbar back: Normal.     Comments: Left hip: No localized tenderness.  Full range of motion but complaining of pain.  Skin:    General: Skin is warm and dry.  Neurological:     General: No focal deficit present.     Mental Status: She is alert and oriented to person, place, and time.  Psychiatric:        Mood and Affect: Mood normal.        Behavior: Behavior normal.      ASSESSMENT & PLAN: Percilla was seen today for hip pain and medication refill.  Diagnoses and all orders for this visit:  Left hip pain -     HYDROcodone-acetaminophen (NORCO) 5-325 MG tablet; Take 1 tablet by mouth every 6 (six) hours as needed for severe pain.  Chronic obstructive pulmonary disease, unspecified COPD type (HCC) -     albuterol (VENTOLIN HFA) 108 (90 Base) MCG/ACT inhaler; Inhale 2 puffs into the lungs every 4 (four) hours as needed for wheezing or shortness of breath. -     fluticasone furoate-vilanterol (BREO ELLIPTA) 100-25 MCG/INH AEPB; Inhale 1 puff into the  lungs daily. -     For home use only DME Nebulizer machine  Seasonal allergies -     cromolyn (NASALCROM) 5.2 MG/ACT nasal spray; Place 1 spray into both nostrils 2 (two) times daily.    Patient Instructions       If you have lab work done today you will be contacted with your lab results within the next 2 weeks.  If you have not heard from [place] then please contact us. The fastest way to get your results is to register for My Chart.   IF you received an x-ray today, you will receive an invoice from [facility]. Please contact [facility] at [phone] with questions or concerns regarding your invoice.   IF you received labwork today, you will receive an invoice from [place]. Please contact [facility] at [phone] with questions or concerns regarding your invoice.   Our billing staff will not be able to assist you with questions regarding bills from these companies.  You will be contacted with the lab results as soon as they are available. The fastest way to get your results is to activate your My Chart account. Instructions are located on the last page of this paperwork. If you have not heard from [place] regarding the results in 2 weeks, please contact this office.     Mantenimiento de la salud despus de los 53 aos de edad Health Maintenance After Age 57 Despus de los 65 aos de edad, corre un riesgo mayor de Tourist information centre manager enfermedades e infecciones a Barrister's clerk, como tambin de sufrir lesiones por cadas. Las cadas son la causa principal de las fracturas de huesos y lesiones en la cabeza de personas mayores de 20 aos de edad. Recibir cuidados preventivos de forma regular puede ayudarlo a mantenerse saludable y en buen [place]. Los cuidados preventivos incluyen realizarse anlisis de forma regular y  realizar cambios en el estilo de vida segn las recomendaciones del mdico. Converse con el profesional que lo asiste sobre:  Las pruebas de deteccin y los anlisis  que debe Dispensing optician. Una prueba de deteccin es un estudio que se para Hydrographic surveyor la presencia de una enfermedad cuando no tiene sntomas.  Un plan de dieta y ejercicios adecuado para usted. Qu debo saber sobre las pruebas de deteccin y los anlisis para prevenir cadas? Realizarse pruebas de deteccin y [employer] es la mejor manera de Hydrographic surveyor un problema de salud de forma temprana. El diagnstico y tratamiento tempranos le brindan la mejor oportunidad de Chief Technology Officer las afecciones mdicas que son comunes despus de los 16 aos de edad. Ciertas afecciones y elecciones de estilo de vida pueden hacer que sea ms propenso a sufrir Engineer, manufacturing. El mdico puede recomendarle lo siguiente:  Controles regulares de la visin. Una visin deficiente y afecciones como las cataratas pueden hacer que sea ms propenso a sufrir Engineer, manufacturing. Si [place] lentes, asegrese de obtener una receta actualizada si su visin cambia.  Revisin de medicamentos. Revise regularmente con el mdico todos los medicamentos que toma, incluidos los medicamentos de [place]. Consulte al [employer] efectos secundarios que pueden hacer que sea ms propenso a sufrir Engineer, manufacturing. Informe al mdico si alguno de los medicamentos que toma lo hace sentir mareado o somnoliento.  Pruebas de deteccin para la osteoporosis. La osteoporosis es una afeccin que hace que los huesos se vuelvan ms frgiles. En consecuencia, los huesos pueden debilitarse y quebrarse ms fcilmente.  Pruebas de deteccin para la presin arterial. Los cambios en la presin arterial y los medicamentos para Chief Technology Officer la presin arterial pueden hacerlo sentir mareado.  Controles de fuerza y equilibrio. El mdico puede recomendar ciertos estudios para controlar su fuerza y equilibrio al estar de pie, al caminar o al cambiar de posicin.  Examen de los pies. El dolor y Chiropractor en los pies, como tambin no utilizar el calzado [place], pueden hacer que sea ms propenso  a sufrir Engineer, manufacturing.  Prueba de deteccin de la depresin. Es ms probable que sufra una cada si tiene miedo a caerse, se siente mal emocionalmente o se siente incapaz de Patent examiner.  Prueba de deteccin de consumo de alcohol. Beber demasiado alcohol puede afectar su equilibrio y puede hacer que sea ms propenso a sufrir Engineer, manufacturing. Qu medidas puedo tomar para reducir mi riesgo de sufrir una cada? Instrucciones generales  Hable con el mdico sobre sus riesgos de sufrir una cada. Infrmele a su mdico si: ? Se cae. Asegrese de informarle a su mdico acerca de todas las cadas, incluso aquellas que parecen ser [employer] ? Se siente mareado, somnoliento o que pierde el equilibrio.  Tome los medicamentos de venta libre y los recetados solamente como se lo haya indicado el mdico. Estos incluyen todos los suplementos.  Siga una dieta sana y [place] un peso saludable. Una dieta saludable incluye productos lcteos descremados, carnes bajas en contenido de grasa ([place], [place] de granos enteros, frijoles y [place] frutas y verduras. La seguridad en el hogar  Retire los objetos que puedan causar tropiezos tales como alfombras, cables u obstculos.  Instale equipos de seguridad, como barras para sostn en los baos y barandas de seguridad en las escaleras.  [place] habitaciones y los pasillos bien iluminados. Actividad   Siga un programa de ejercicio regular para mantenerse en forma. Esto lo ayudar a Contractor equilibrio. Consulte al mdico qu  tipos de ejercicios son adecuados para usted.  Si necesita un bastn o un andador, selo segn las recomendaciones del mdico.  Utilice calzado con buen apoyo y suela antideslizante. Estilo de vida  No beba alcohol si el mdico le indica que no beba.  Si bebe alcohol, limite la cantidad que consume: ? De 0 a 1 medida por da para las mujeres. ? De 0 a 2 medidas por da para los hombres.  Est atento a la  cantidad de alcohol que contiene su bebida. En los [place] una medida equivale a una botella tpica de cerveza (12 onzas), media copa de vino (5 onzas) o una medida de bebida blanca (1 onza).  No consuma ningn producto que contenga nicotina o tabaco, como cigarrillos y Psychologist, sport and exercise. Si necesita ayuda para dejar de fumar, consulte al mdico. Resumen  Tener un estilo de vida saludable y recibir cuidados preventivos pueden ayudar a Theatre stage manager salud y el bienestar despus de los 77 aos de [place].  Realizarse pruebas de deteccin y [employer] es la mejor manera de Hydrographic surveyor un problema de salud de forma temprana y Lourena Simmonds a Product/process development scientist una cada. El diagnstico y tratamiento tempranos le brindan la mejor oportunidad de Chief Technology Officer las afecciones mdicas ms comunes en las personas mayores de 66 aos de edad.  Las cadas son la causa principal de las fracturas de huesos y lesiones en la cabeza de personas mayores de 21 aos de edad. Tome precauciones para evitar una cada en su casa.  Trabaje con el mdico para saber qu cambios que puede hacer para mejorar su salud y [place], y [place]. Esta informacin no tiene Marine scientist el consejo del mdico. Asegrese de hacerle al mdico cualquier pregunta que tenga. Document Released: 04/02/2017 Document Revised: 04/02/2017 Document Reviewed: 04/02/2017 Elsevier Patient Education  2020 [place] de cadera Hip Pain  La cadera es la articulacin entre la parte superior de las piernas y la parte inferior de la pelvis. Los [employer], Charity fundraiser, los tendones y los msculos de la articulacin de la cadera sostienen el peso del cuerpo y permiten el desplazamiento. El Social research officer, government de cadera puede tener distintos grados, desde un dolor leve hasta un dolor intenso en uno o ambos lados de la cadera. El dolor puede sentirse en la parte interna de la articulacin de la cadera, cerca de la ingle, o en la parte externa, cerca de las  nalgas y la parte superior de los muslos. Tambin puede estar acompaado de hinchazn o entumecimiento. Siga estas instrucciones en su casa: Control del dolor, de la rigidez y de la hinchazn  Si se lo indican, aplique hielo sobre la zona lesionada. ? Ponga el hielo en una bolsa plstica. ? Coloque una [employer] piel y la bolsa de hielo. ? Deje el hielo durante 20 minutos, y aplquelo de 2 a 3 veces por Training and development officer.  Duerma con una almohada entre las piernas del lado que le sea ms cmodo.  Evite las [employer] causen [employer]. Instrucciones generales  [employer] de venta libre y los recetados solamente como se lo haya indicado el mdico.  Haga ejercicios como se lo haya indicado el mdico.  Registre la siguiente informacin: ? Con qu frecuencia le duele la cadera. ? La ubicacin del dolor. ? Cmo se siente el dolor. ? Qu es lo que hace que el dolor empeore.  Concurra a todas las visitas de control como se lo haya indicado el mdico. Esto es  importante. Comunquese con un mdico si:  No puede apoyar el peso del cuerpo [employer].  El dolor o la hinchazn continan o empeoran despus de [place].  Tiene ms dificultades para caminar.  Tiene fiebre. Solicite ayuda de inmediato si:  Se cae.  El dolor y la hinchazn de la cadera aumentan de repente.  La cadera est enrojecida o hinchada, o muy dolorida con la palpacin. Resumen  El dolor de cadera puede tener distintos grados, desde un dolor leve hasta un dolor intenso en uno o ambos lados de la cadera.  El dolor puede sentirse en la parte interna de la articulacin de la cadera, cerca de la ingle, o en la parte externa, cerca de las nalgas y la parte superior de los muslos.  Evite las [employer] causen [employer].  Anote la frecuencia con la que tiene dolor en la cadera, la ubicacin del dolor, qu es lo que hace que el dolor empeore y lo que siente. Esta informacin no tiene Hydrologist el consejo del mdico. Asegrese de hacerle al mdico cualquier pregunta que tenga. Document Released: 07/04/2013 Document Revised: 05/23/2016 Document Reviewed: 05/23/2016 Elsevier Patient Education  2020 Elsevier Inc.      Agustina Caroli, MD [facility]

## 2019-01-13 NOTE — Patient Instructions (Addendum)
   If you have lab work done today you will be contacted with your lab results within the next 2 weeks.  If you have not heard from us then please contact us. The fastest way to get your results is to register for My Chart.   IF you received an x-ray today, you will receive an invoice from [facility]. Please contact  Radiology at [phone] with questions or concerns regarding your invoice.   IF you received labwork today, you will receive an invoice from [facility]. Please contact [facility] at [phone] with questions or concerns regarding your invoice.   Our billing staff will not be able to assist you with questions regarding bills from these companies.  You will be contacted with the lab results as soon as they are available. The fastest way to get your results is to activate your My Chart account. Instructions are located on the last page of this paperwork. If you have not heard from us regarding the results in 2 weeks, please contact this office.     Mantenimiento de la salud despus de los 65 aos de edad Health Maintenance After Age 65 Despus de los 65 aos de edad, corre un riesgo mayor de padecer ciertas enfermedades e infecciones a largo plazo, como tambin de sufrir lesiones por cadas. Las cadas son la causa principal de las fracturas de huesos y lesiones en la cabeza de personas mayores de 65 aos de edad. Recibir cuidados preventivos de forma regular puede ayudarlo a mantenerse saludable y en buen estado. Los cuidados preventivos incluyen realizarse anlisis de forma regular y realizar cambios en el estilo de vida segn las recomendaciones del mdico. Converse con el profesional que lo asiste sobre:  Las pruebas de deteccin y los anlisis que debe realizarse. Una prueba de deteccin es un estudio que se para detectar la presencia de una enfermedad cuando no tiene sntomas.  Un plan de dieta y ejercicios adecuado para usted. Qu debo saber sobre las  pruebas de deteccin y los anlisis para prevenir cadas? Realizarse pruebas de deteccin y anlisis es la mejor manera de detectar un problema de salud de forma temprana. El diagnstico y tratamiento tempranos le brindan la mejor oportunidad de controlar las afecciones mdicas que son comunes despus de los 65 aos de edad. Ciertas afecciones y elecciones de estilo de vida pueden hacer que sea ms propenso a sufrir una cada. El mdico puede recomendarle lo siguiente:  Controles regulares de la visin. Una visin deficiente y afecciones como las cataratas pueden hacer que sea ms propenso a sufrir una cada. Si usa lentes, asegrese de obtener una receta actualizada si su visin cambia.  Revisin de medicamentos. Revise regularmente con el mdico todos los medicamentos que toma, incluidos los medicamentos de venta libre. Consulte al mdico sobre los efectos secundarios que pueden hacer que sea ms propenso a sufrir una cada. Informe al mdico si alguno de los medicamentos que toma lo hace sentir mareado o somnoliento.  Pruebas de deteccin para la osteoporosis. La osteoporosis es una afeccin que hace que los huesos se vuelvan ms frgiles. En consecuencia, los huesos pueden debilitarse y quebrarse ms fcilmente.  Pruebas de deteccin para la presin arterial. Los cambios en la presin arterial y los medicamentos para controlar la presin arterial pueden hacerlo sentir mareado.  Controles de fuerza y equilibrio. El mdico puede recomendar ciertos estudios para controlar su fuerza y equilibrio al estar de pie, al caminar o al cambiar de posicin.  Examen de los pies.   El dolor y Chiropractor en los pies, como tambin no utilizar el calzado [place], pueden hacer que sea ms propenso a sufrir Engineer, manufacturing.  Prueba de deteccin de la depresin. Es ms probable que sufra una cada si tiene miedo a caerse, se siente mal emocionalmente o se siente incapaz de Stage manager.  Prueba de deteccin de consumo de alcohol. Beber demasiado alcohol puede afectar su equilibrio y puede hacer que sea ms propenso a sufrir Engineer, manufacturing. Qu medidas puedo tomar para reducir mi riesgo de sufrir una cada? Instrucciones generales  Hable con el mdico sobre sus riesgos de sufrir una cada. Infrmele a su mdico si: ? Se cae. Asegrese de informarle a su mdico acerca de todas las cadas, incluso aquellas que parecen ser [employer] ? Se siente mareado, somnoliento o que pierde el equilibrio.  Tome los medicamentos de venta libre y los recetados solamente como se lo haya indicado el mdico. Estos incluyen todos los suplementos.  Siga una dieta sana y [place] un peso saludable. Una dieta saludable incluye productos lcteos descremados, carnes bajas en contenido de grasa ([place], [place] de granos enteros, frijoles y [place] frutas y verduras. La seguridad en el hogar  Retire los objetos que puedan causar tropiezos tales como alfombras, cables u obstculos.  Instale equipos de seguridad, como barras para sostn en los baos y barandas de seguridad en las escaleras.  [place] habitaciones y los pasillos bien iluminados. Actividad   Siga un programa de ejercicio regular para mantenerse en forma. Esto lo ayudar a Contractor equilibrio. Consulte al mdico qu tipos de ejercicios son adecuados para usted.  Si necesita un bastn o un andador, selo segn las recomendaciones del mdico.  Utilice calzado con buen apoyo y suela antideslizante. Estilo de vida  No beba alcohol si el mdico le indica que no beba.  Si bebe alcohol, limite la cantidad que consume: ? De 0 a 1 medida por da para las mujeres. ? De 0 a 2 medidas por da para los hombres.  Est atento a la cantidad de alcohol que contiene su bebida. En los [place] una medida equivale a una botella tpica de cerveza (12 onzas), media copa de vino (5 onzas) o una medida de bebida blanca (1 onza).  No consuma  ningn producto que contenga nicotina o tabaco, como cigarrillos y Psychologist, sport and exercise. Si necesita ayuda para dejar de fumar, consulte al mdico. Resumen  Tener un estilo de vida saludable y recibir cuidados preventivos pueden ayudar a Theatre stage manager salud y el bienestar despus de los 59 aos de [place].  Realizarse pruebas de deteccin y [employer] es la mejor manera de Hydrographic surveyor un problema de salud de forma temprana y Lourena Simmonds a Product/process development scientist una cada. El diagnstico y tratamiento tempranos le brindan la mejor oportunidad de Chief Technology Officer las afecciones mdicas ms comunes en las personas mayores de 75 aos de edad.  Las cadas son la causa principal de las fracturas de huesos y lesiones en la cabeza de personas mayores de 28 aos de edad. Tome precauciones para evitar una cada en su casa.  Trabaje con el mdico para saber qu cambios que puede hacer para mejorar su salud y [place], y [place]. Esta informacin no tiene Marine scientist el consejo del mdico. Asegrese de hacerle al mdico cualquier pregunta que tenga. Document Released: 04/02/2017 Document Revised: 04/02/2017 Document Reviewed: 04/02/2017 Elsevier Patient Education  2020 [place] de cadera Hip Pain  La cadera es  la articulacin entre la parte superior de las piernas y la parte inferior de la pelvis. [employer], Charity fundraiser, los tendones y los msculos de la articulacin de la cadera sostienen el peso del cuerpo y permiten el desplazamiento. El Social research officer, government de cadera puede tener distintos grados, desde un dolor leve hasta un dolor intenso en uno o ambos lados de la cadera. El dolor puede sentirse en la parte interna de la articulacin de la cadera, cerca de la ingle, o en la parte externa, cerca de las nalgas y la parte superior de los muslos. Tambin puede estar acompaado de hinchazn o entumecimiento. Siga estas instrucciones en su casa: Control del dolor, de la rigidez y de la hinchazn  Si se lo  indican, aplique hielo sobre la zona lesionada. ? Ponga el hielo en una bolsa plstica. ? Coloque una [employer] piel y la bolsa de hielo. ? Deje el hielo durante 20 minutos, y aplquelo de 2 a 3 veces por Training and development officer.  Duerma con una almohada entre las piernas del lado que le sea ms cmodo.  Evite las [employer] causen [employer]. Instrucciones generales  [employer] de venta libre y los recetados solamente como se lo haya indicado el mdico.  Haga ejercicios como se lo haya indicado el mdico.  Registre la siguiente informacin: ? Con qu frecuencia le duele la cadera. ? La ubicacin del dolor. ? Cmo se siente el dolor. ? Qu es lo que hace que el dolor empeore.  Concurra a todas las visitas de control como se lo haya indicado el mdico. Esto es importante. Comunquese con un mdico si:  No puede apoyar el peso del cuerpo [employer].  El dolor o la hinchazn continan o empeoran despus de [place].  Tiene ms dificultades para caminar.  Tiene fiebre. Solicite ayuda de inmediato si:  Se cae.  El dolor y la hinchazn de la cadera aumentan de repente.  La cadera est enrojecida o hinchada, o muy dolorida con la palpacin. Resumen  El dolor de cadera puede tener distintos grados, desde un dolor leve hasta un dolor intenso en uno o ambos lados de la cadera.  El dolor puede sentirse en la parte interna de la articulacin de la cadera, cerca de la ingle, o en la parte externa, cerca de las nalgas y la parte superior de los muslos.  Evite las [employer] causen [employer].  Anote la frecuencia con la que tiene dolor en la cadera, la ubicacin del dolor, qu es lo que hace que el dolor empeore y lo que siente. Esta informacin no tiene Marine scientist el consejo del mdico. Asegrese de hacerle al mdico cualquier pregunta que tenga. Document Released: 07/04/2013 Document Revised: 05/23/2016 Document Reviewed: 05/23/2016 Elsevier Patient Education   2020 [employer].

## 2019-01-14 ENCOUNTER — Encounter: Payer: Self-pay | Admitting: Emergency Medicine

## 2019-01-17 DIAGNOSIS — J449 Chronic obstructive pulmonary disease, unspecified: Secondary | ICD-10-CM | POA: Diagnosis not present

## 2019-02-16 DIAGNOSIS — J449 Chronic obstructive pulmonary disease, unspecified: Secondary | ICD-10-CM | POA: Diagnosis not present

## 2019-03-17 ENCOUNTER — Ambulatory Visit: Payer: Medicare HMO | Attending: Internal Medicine

## 2019-03-17 DIAGNOSIS — Z20822 Contact with and (suspected) exposure to covid-19: Secondary | ICD-10-CM

## 2019-03-19 DIAGNOSIS — J449 Chronic obstructive pulmonary disease, unspecified: Secondary | ICD-10-CM | POA: Diagnosis not present

## 2019-03-19 LAB — NOVEL CORONAVIRUS, NAA: SARS-CoV-2, NAA: NOT DETECTED

## 2019-04-06 ENCOUNTER — Encounter: Payer: Self-pay | Admitting: Emergency Medicine

## 2019-04-07 ENCOUNTER — Other Ambulatory Visit: Payer: Self-pay | Admitting: Emergency Medicine

## 2019-04-07 DIAGNOSIS — R05 Cough: Secondary | ICD-10-CM

## 2019-04-07 DIAGNOSIS — R059 Cough, unspecified: Secondary | ICD-10-CM

## 2019-04-07 DIAGNOSIS — E785 Hyperlipidemia, unspecified: Secondary | ICD-10-CM

## 2019-04-07 DIAGNOSIS — Z8719 Personal history of other diseases of the digestive system: Secondary | ICD-10-CM

## 2019-04-07 MED ORDER — OMEPRAZOLE 20 MG PO CPDR: 20.0000 mg | DELAYED_RELEASE_CAPSULE | Freq: Every day | ORAL | 3 refills | Status: DC

## 2019-04-07 MED ORDER — BENZONATATE 200 MG PO CAPS: 200.0000 mg | ORAL_CAPSULE | Freq: Two times a day (BID) | ORAL | 0 refills | Status: DC | PRN

## 2019-04-07 MED ORDER — SIMVASTATIN 20 MG PO TABS: 20.0000 mg | ORAL_TABLET | Freq: Every day | ORAL | 3 refills | Status: DC

## 2019-04-16 ENCOUNTER — Encounter: Payer: Self-pay | Admitting: Emergency Medicine

## 2019-04-19 DIAGNOSIS — J449 Chronic obstructive pulmonary disease, unspecified: Secondary | ICD-10-CM | POA: Diagnosis not present

## 2019-05-11 ENCOUNTER — Encounter: Payer: Self-pay | Admitting: Emergency Medicine

## 2019-05-11 ENCOUNTER — Other Ambulatory Visit: Payer: Self-pay | Admitting: Emergency Medicine

## 2019-05-11 DIAGNOSIS — I10 Essential (primary) hypertension: Secondary | ICD-10-CM

## 2019-05-11 MED ORDER — DILTIAZEM HCL ER COATED BEADS 240 MG PO CP24: 240.0000 mg | ORAL_CAPSULE | Freq: Every day | ORAL | 3 refills | Status: DC

## 2019-05-11 MED ORDER — MONTELUKAST SODIUM 10 MG PO TABS: 10.0000 mg | ORAL_TABLET | Freq: Every day | ORAL | 3 refills | Status: DC

## 2019-05-17 DIAGNOSIS — J449 Chronic obstructive pulmonary disease, unspecified: Secondary | ICD-10-CM | POA: Diagnosis not present

## 2019-06-09 ENCOUNTER — Other Ambulatory Visit: Payer: Self-pay | Admitting: Emergency Medicine

## 2019-06-09 ENCOUNTER — Encounter: Payer: Self-pay | Admitting: Emergency Medicine

## 2019-06-09 DIAGNOSIS — I10 Essential (primary) hypertension: Secondary | ICD-10-CM

## 2019-06-09 MED ORDER — DILTIAZEM HCL ER COATED BEADS 240 MG PO CP24: 240.0000 mg | ORAL_CAPSULE | Freq: Every day | ORAL | 3 refills | Status: DC

## 2019-06-09 NOTE — Telephone Encounter (Signed)
Pt message is in [address] sagardia puede enviarme receta d cartia xt [phone] hr cap al [facility] d la elmsly Gracias q tenga un bonito dia

## 2019-06-14 DIAGNOSIS — G4733 Obstructive sleep apnea (adult) (pediatric): Secondary | ICD-10-CM | POA: Diagnosis not present

## 2019-06-17 DIAGNOSIS — J449 Chronic obstructive pulmonary disease, unspecified: Secondary | ICD-10-CM | POA: Diagnosis not present

## 2019-06-23 ENCOUNTER — Ambulatory Visit: Payer: Medicare HMO | Admitting: Emergency Medicine

## 2019-06-30 ENCOUNTER — Ambulatory Visit (INDEPENDENT_AMBULATORY_CARE_PROVIDER_SITE_OTHER): Payer: Medicare HMO | Admitting: Emergency Medicine

## 2019-06-30 ENCOUNTER — Encounter: Payer: Self-pay | Admitting: Emergency Medicine

## 2019-06-30 ENCOUNTER — Other Ambulatory Visit: Payer: Self-pay

## 2019-06-30 VITALS — BP 130/76 | HR 91 | Temp 97.7°F | Resp 16 | Ht 60.0 in | Wt 165.0 lb

## 2019-06-30 DIAGNOSIS — R590 Localized enlarged lymph nodes: Secondary | ICD-10-CM

## 2019-06-30 DIAGNOSIS — Z1211 Encounter for screening for malignant neoplasm of colon: Secondary | ICD-10-CM

## 2019-06-30 DIAGNOSIS — Z1231 Encounter for screening mammogram for malignant neoplasm of breast: Secondary | ICD-10-CM | POA: Diagnosis not present

## 2019-06-30 NOTE — Progress Notes (Signed)
Dana Daniels 74 y.o.   Chief Complaint  Patient presents with  . Arm Pain    per pt after Covid vaccine - [facility]    HISTORY OF PRESENT ILLNESS: This is a 74 y.o. female recently received second Covid vaccine injection and developed axillary adenopathy in the right arm which is now much better.  No associated symptoms. No other complaints or medical concerns today.  HPI   Prior to Admission medications   Medication Sig Start Date End Date Taking? Authorizing Provider  albuterol (VENTOLIN HFA) 108 (90 Base) MCG/ACT inhaler Inhale 2 puffs into the lungs every 4 (four) hours as needed for wheezing or shortness of breath. 01/13/19  Yes Aalyah Mansouri, Ines Bloomer, MD  benzonatate (TESSALON) 200 MG capsule Take 1 capsule (200 mg total) by mouth 2 (two) times daily as needed for cough. 04/07/19  Yes Lareina Espino, Ines Bloomer, MD  cromolyn (NASALCROM) 5.2 MG/ACT nasal spray Place 1 spray into both nostrils 2 (two) times daily. 01/13/19  Yes Hisae Decoursey, Ines Bloomer, MD  diltiazem (CARDIZEM CD) 240 MG 24 hr capsule Take 1 capsule (240 mg total) by mouth daily. 06/09/19 09/07/19 Yes Khyron Garno, Ines Bloomer, MD  fluticasone furoate-vilanterol (BREO ELLIPTA) 100-25 MCG/INH AEPB Inhale 1 puff into the lungs daily. 01/13/19  Yes Khyle Goodell, Ines Bloomer, MD  montelukast (SINGULAIR) 10 MG tablet Take 1 tablet (10 mg total) by mouth at bedtime. 05/11/19 08/09/19 Yes Jacai Kipp, Ines Bloomer, MD  nitroGLYCERIN (NITROSTAT) 0.4 MG SL tablet Place 0.4 mg under the tongue every 5 (five) minutes as needed for chest pain.   Yes [provider]  omeprazole (PRILOSEC) 20 MG capsule Take 1 capsule (20 mg total) by mouth daily. 04/07/19 07/06/19 Yes Demetrice Amstutz, Ines Bloomer, MD  simvastatin (ZOCOR) 20 MG tablet Take 1 tablet (20 mg total) by mouth daily. 04/07/19 07/06/19 Yes Kamon Fahr, Ines Bloomer, MD  cetirizine (ZYRTEC) 10 MG tablet Take 1 tablet (10 mg total) by mouth daily for 7 days. 03/31/18 04/07/18  Horald Pollen, MD    Cholecalciferol (VITAMIN D3) 10000 units TABS Take by mouth.    [provider]  HYDROcodone-acetaminophen (NORCO) 5-325 MG tablet Take 1 tablet by mouth every 6 (six) hours as needed for moderate pain. Patient not taking: Reported on 06/30/2019 09/25/17   Shawnee Knapp, MD  HYDROcodone-acetaminophen [facility]) 5-325 MG tablet Take 1 tablet by mouth every 6 (six) hours as needed for severe pain. Patient not taking: Reported on 06/30/2019 01/13/19   Horald Pollen, MD  valACYclovir (VALTREX) 1000 MG tablet Take 1 tablet (1,000 mg total) by mouth 2 (two) times daily. Patient not taking: Reported on 06/30/2019 09/25/17   Horald Pollen, MD    Allergies  Allergen Reactions  . Tramadol Itching    Patient Active Problem List   Diagnosis Date Noted  . History of gastroesophageal reflux (GERD) 09/25/2017  . Osteoarthritis of multiple joints 03/24/2017  . Chronic pain of left knee 03/24/2017  . Chronic obstructive pulmonary disease ([place]) 03/24/2017  . Primary cancer of skin of left foot 02/03/2017  . Mild intermittent asthma without complication [id number]  . Sleep apnea 02/03/2017  . Dyslipidemia 02/03/2017  . Migraine 02/03/2017  . Essential hypertension 09/18/2008  . Allergic rhinitis 09/18/2008    Past Medical History:  Diagnosis Date  . Allergy   . Arthritis   . Asthma   . Blood transfusion without reported diagnosis   . Cataract   . Hyperlipidemia   . Hypertension   . Migraine   .  Osteoporosis   . Sleep apnea     Past Surgical History:  Procedure Laterality Date  . ABDOMINAL HYSTERECTOMY    . CHOLECYSTECTOMY    . EYE SURGERY Left    cataract   . FOOT SURGERY Left     Social History   Socioeconomic History  . Marital status: Widowed    Spouse name: Not on file  . Number of children: Not on file  . Years of education: Not on file  . Highest education level: Not on file  Occupational History  . Not on file  Tobacco Use  . Smoking status: Never  Smoker  . Smokeless tobacco: Never Used  Substance and Sexual Activity  . Alcohol use: No  . Drug use: Never  . Sexual activity: Not on file  Other Topics Concern  . Not on file  Social History Narrative  . Not on file   Social Determinants of Health   Financial Resource Strain:   . Difficulty of Paying Living Expenses:   Food Insecurity:   . Worried About Charity fundraiser in the Last Year:   . Arboriculturist in the Last Year:   Transportation Needs:   . Film/video editor (Medical):   Marland Kitchen Lack of Transportation (Non-Medical):   Physical Activity:   . Days of Exercise per Week:   . Minutes of Exercise per Session:   Stress:   . Feeling of Stress :   Social Connections:   . Frequency of Communication with Friends and Family:   . Frequency of Social Gatherings with Friends and Family:   . Attends Religious Services:   . Active Member of Clubs or Organizations:   . Attends Archivist Meetings:   Marland Kitchen Marital Status:   Intimate Partner Violence:   . Fear of Current or Ex-Partner:   . Emotionally Abused:   Marland Kitchen Physically Abused:   . Sexually Abused:     Family History  Problem Relation Age of Onset  . Heart disease Mother   . Hyperlipidemia Mother   . Hypertension Mother   . Stroke Mother   . Heart disease Father   . Stroke Father   . Hypertension Son   . Hyperlipidemia Son   . Fibromyalgia Son   . Lupus Son   . Hypertension Son   . Diabetes Daughter   . Hypertension Daughter      Review of Systems  Constitutional: Negative.  Negative for chills and fever.  HENT: Negative.  Negative for congestion and sore throat.   Respiratory: Negative.  Negative for cough and shortness of breath.   Cardiovascular: Negative.  Negative for chest pain and palpitations.  Gastrointestinal: Negative.  Negative for abdominal pain, diarrhea, nausea and vomiting.  Genitourinary: Negative.   Skin: Negative.  Negative for rash.  Neurological: Negative.  Negative for  dizziness and headaches.  All other systems reviewed and are negative.  Today's Vitals   06/30/19 1500  BP: 130/76  Pulse: 91  Resp: 16  Temp: 97.7 F (36.5 C)  TempSrc: Temporal  SpO2: 96%  Weight: 165 lb (74.8 kg)  Height: 5' (1.524 m)   Body mass index is 32.22 kg/m.   Physical Exam Constitutional:      Appearance: Normal appearance.  HENT:     Head: Normocephalic.  Eyes:     Extraocular Movements: Extraocular movements intact.     Pupils: Pupils are equal, round, and reactive to light.  Cardiovascular:     Rate and Rhythm:  Normal rate.  Pulmonary:     Effort: Pulmonary effort is normal.  Musculoskeletal:        General: Normal range of motion.     Cervical back: Normal range of motion.  Lymphadenopathy:     Cervical: No cervical adenopathy.     Right cervical: No superficial cervical adenopathy.    Left cervical: No superficial cervical adenopathy.     Upper Body:     Right upper body: No axillary adenopathy.     Left upper body: No axillary adenopathy.  Skin:    Capillary Refill: Capillary refill takes less than 2 seconds.  Neurological:     General: No focal deficit present.     Mental Status: She is alert and oriented to person, place, and time.  Psychiatric:        Mood and Affect: Mood normal.        Behavior: Behavior normal.      ASSESSMENT & PLAN: Kenedee was seen today for arm pain.  Diagnoses and all orders for this visit:  Axillary lymphadenopathy Comments: Much improved  Screening for Daniels cancer -     Cologuard  Encounter for screening mammogram for malignant neoplasm of breast -     MM Digital Screening; Future    Patient Instructions       If you have lab work done today you will be contacted with your lab results within the next 2 weeks.  If you have not heard from [place] then please contact us. The fastest way to get your results is to register for My Chart.   IF you received an x-ray today, you will receive an invoice  from [facility]. Please contact [facility] at [phone] with questions or concerns regarding your invoice.   IF you received labwork today, you will receive an invoice from [place]. Please contact [facility] at [phone] with questions or concerns regarding your invoice.   Our billing staff will not be able to assist you with questions regarding bills from these companies.  You will be contacted with the lab results as soon as they are available. The fastest way to get your results is to activate your My Chart account. Instructions are located on the last page of this paperwork. If you have not heard from [place] regarding the results in 2 weeks, please contact this office.     Mantenimiento de la salud despus de los 104 aos de edad Health Maintenance After Age 73 Despus de los 65 aos de edad, corre un riesgo mayor de Tourist information centre manager enfermedades e infecciones a Barrister's clerk, como tambin de sufrir lesiones por cadas. Las cadas son la causa principal de las fracturas de huesos y lesiones en la cabeza de personas mayores de 59 aos de edad. Recibir cuidados preventivos de forma regular puede ayudarlo a mantenerse saludable y en buen [place]. Los cuidados preventivos incluyen realizarse anlisis de forma regular y Actor en el estilo de vida segn las recomendaciones del mdico. Converse con el profesional que lo asiste sobre:  Las pruebas de deteccin y los anlisis que debe Dispensing optician. Una prueba de deteccin es un estudio que se para Hydrographic surveyor la presencia de una enfermedad cuando no tiene sntomas.  Un plan de dieta y ejercicios adecuado para usted. Qu debo saber sobre las pruebas de deteccin y los anlisis para prevenir cadas? Realizarse pruebas de deteccin y [employer] es la mejor manera de Hydrographic surveyor un problema de salud de forma temprana. El diagnstico y tratamiento tempranos le brindan  la mejor oportunidad de Chief Technology Officer las afecciones mdicas que son comunes  despus de los 36 aos de edad. Ciertas afecciones y elecciones de estilo de vida pueden hacer que sea ms propenso a sufrir Engineer, manufacturing. El mdico puede recomendarle lo siguiente:  Controles regulares de la visin. Una visin deficiente y afecciones como las cataratas pueden hacer que sea ms propenso a sufrir Engineer, manufacturing. Si [place] lentes, asegrese de obtener una receta actualizada si su visin cambia.  Revisin de medicamentos. Revise regularmente con el mdico todos los medicamentos que toma, incluidos los medicamentos de [place]. Consulte al [employer] efectos secundarios que pueden hacer que sea ms propenso a sufrir Engineer, manufacturing. Informe al mdico si alguno de los medicamentos que toma lo hace sentir mareado o somnoliento.  Pruebas de deteccin para la osteoporosis. La osteoporosis es una afeccin que hace que los huesos se vuelvan ms frgiles. En consecuencia, los huesos pueden debilitarse y quebrarse ms fcilmente.  Pruebas de deteccin para la presin arterial. Los cambios en la presin arterial y los medicamentos para Chief Technology Officer la presin arterial pueden hacerlo sentir mareado.  Controles de fuerza y equilibrio. El mdico puede recomendar ciertos estudios para controlar su fuerza y equilibrio al estar de pie, al caminar o al cambiar de posicin.  Examen de los pies. El dolor y Chiropractor en los pies, como tambin no utilizar el calzado [place], pueden hacer que sea ms propenso a sufrir Engineer, manufacturing.  Prueba de deteccin de la depresin. Es ms probable que sufra una cada si tiene miedo a caerse, se siente mal emocionalmente o se siente incapaz de Patent examiner.  Prueba de deteccin de consumo de alcohol. Beber demasiado alcohol puede afectar su equilibrio y puede hacer que sea ms propenso a sufrir Engineer, manufacturing. Qu medidas puedo tomar para reducir mi riesgo de sufrir una cada? Instrucciones generales  Hable con el mdico sobre sus riesgos de sufrir una  cada. Infrmele a su mdico si: ? Se cae. Asegrese de informarle a su mdico acerca de todas las cadas, incluso aquellas que parecen ser [employer] ? Se siente mareado, somnoliento o que pierde el equilibrio.  Tome los medicamentos de venta libre y los recetados solamente como se lo haya indicado el mdico. Estos incluyen todos los suplementos.  Siga una dieta sana y [place] un peso saludable. Una dieta saludable incluye productos lcteos descremados, carnes bajas en contenido de grasa ([place], [place] de granos enteros, frijoles y [place] frutas y verduras. La seguridad en el hogar  Retire los objetos que puedan causar tropiezos tales como alfombras, cables u obstculos.  Instale equipos de seguridad, como barras para sostn en los baos y barandas de seguridad en las escaleras.  [place] habitaciones y los pasillos bien iluminados. Actividad   Siga un programa de ejercicio regular para mantenerse en forma. Esto lo ayudar a Contractor equilibrio. Consulte al mdico qu tipos de ejercicios son adecuados para usted.  Si necesita un bastn o un andador, selo segn las recomendaciones del mdico.  Utilice calzado con buen apoyo y suela antideslizante. Estilo de vida  No beba alcohol si el mdico le indica que no beba.  Si bebe alcohol, limite la cantidad que consume: ? De 0 a 1 medida por da para las mujeres. ? De 0 a 2 medidas por da para los hombres.  Est atento a la cantidad de alcohol que contiene su bebida. En los [place] una medida equivale a una botella tpica de cerveza (  12 onzas), media copa de vino (5 onzas) o una medida de bebida blanca (1 onza).  No consuma ningn producto que contenga nicotina o tabaco, como cigarrillos y Psychologist, sport and exercise. Si necesita ayuda para dejar de fumar, consulte al mdico. Resumen  Tener un estilo de vida saludable y recibir cuidados preventivos pueden ayudar a Theatre stage manager salud y el bienestar despus de los 32 aos de  [place].  Realizarse pruebas de deteccin y [employer] es la mejor manera de Hydrographic surveyor un problema de salud de forma temprana y Lourena Simmonds a Product/process development scientist una cada. El diagnstico y tratamiento tempranos le brindan la mejor oportunidad de Chief Technology Officer las afecciones mdicas ms comunes en las personas mayores de 68 aos de edad.  Las cadas son la causa principal de las fracturas de huesos y lesiones en la cabeza de personas mayores de 56 aos de edad. Tome precauciones para evitar una cada en su casa.  Trabaje con el mdico para saber qu cambios que puede hacer para mejorar su salud y [place], y [place]. Esta informacin no tiene Marine scientist el consejo del mdico. Asegrese de hacerle al mdico cualquier pregunta que tenga. Document Revised: 04/02/2017 Document Reviewed: 04/02/2017 Elsevier Patient Education  2020 Elsevier Inc.      Agustina Caroli, MD [facility]

## 2019-06-30 NOTE — Patient Instructions (Addendum)
   If you have lab work done today you will be contacted with your lab results within the next 2 weeks.  If you have not heard from us then please contact us. The fastest way to get your results is to register for My Chart.   IF you received an x-ray today, you will receive an invoice from [facility]. Please contact [facility] at [phone] with questions or concerns regarding your invoice.   IF you received labwork today, you will receive an invoice from [facility]. Please contact [facility] at [phone] with questions or concerns regarding your invoice.   Our billing staff will not be able to assist you with questions regarding bills from these companies.  You will be contacted with the lab results as soon as they are available. The fastest way to get your results is to activate your My Chart account. Instructions are located on the last page of this paperwork. If you have not heard from us regarding the results in 2 weeks, please contact this office.     Mantenimiento de la salud despus de los 65 aos de edad Health Maintenance After Age 65 Despus de los 65 aos de edad, corre un riesgo mayor de padecer ciertas enfermedades e infecciones a largo plazo, como tambin de sufrir lesiones por cadas. Las cadas son la causa principal de las fracturas de huesos y lesiones en la cabeza de personas mayores de 65 aos de edad. Recibir cuidados preventivos de forma regular puede ayudarlo a mantenerse saludable y en buen estado. Los cuidados preventivos incluyen realizarse anlisis de forma regular y realizar cambios en el estilo de vida segn las recomendaciones del mdico. Converse con el profesional que lo asiste sobre:  Las pruebas de deteccin y los anlisis que debe realizarse. Una prueba de deteccin es un estudio que se para detectar la presencia de una enfermedad cuando no tiene sntomas.  Un plan de dieta y ejercicios adecuado para usted. Qu debo saber sobre las  pruebas de deteccin y los anlisis para prevenir cadas? Realizarse pruebas de deteccin y anlisis es la mejor manera de detectar un problema de salud de forma temprana. El diagnstico y tratamiento tempranos le brindan la mejor oportunidad de controlar las afecciones mdicas que son comunes despus de los 65 aos de edad. Ciertas afecciones y elecciones de estilo de vida pueden hacer que sea ms propenso a sufrir una cada. El mdico puede recomendarle lo siguiente:  Controles regulares de la visin. Una visin deficiente y afecciones como las cataratas pueden hacer que sea ms propenso a sufrir una cada. Si usa lentes, asegrese de obtener una receta actualizada si su visin cambia.  Revisin de medicamentos. Revise regularmente con el mdico todos los medicamentos que toma, incluidos los medicamentos de venta libre. Consulte al mdico sobre los efectos secundarios que pueden hacer que sea ms propenso a sufrir una cada. Informe al mdico si alguno de los medicamentos que toma lo hace sentir mareado o somnoliento.  Pruebas de deteccin para la osteoporosis. La osteoporosis es una afeccin que hace que los huesos se vuelvan ms frgiles. En consecuencia, los huesos pueden debilitarse y quebrarse ms fcilmente.  Pruebas de deteccin para la presin arterial. Los cambios en la presin arterial y los medicamentos para controlar la presin arterial pueden hacerlo sentir mareado.  Controles de fuerza y equilibrio. El mdico puede recomendar ciertos estudios para controlar su fuerza y equilibrio al estar de pie, al caminar o al cambiar de posicin.  Examen de los pies.   El dolor y el adormecimiento en los pies, como tambin no utilizar el calzado adecuado, pueden hacer que sea ms propenso a sufrir una cada.  Prueba de deteccin de la depresin. Es ms probable que sufra una cada si tiene miedo a caerse, se siente mal emocionalmente o se siente incapaz de realizar actividades que sola  hacer.  Prueba de deteccin de consumo de alcohol. Beber demasiado alcohol puede afectar su equilibrio y puede hacer que sea ms propenso a sufrir una cada. Qu medidas puedo tomar para reducir mi riesgo de sufrir una cada? Instrucciones generales  Hable con el mdico sobre sus riesgos de sufrir una cada. Infrmele a su mdico si: ? Se cae. Asegrese de informarle a su mdico acerca de todas las cadas, incluso aquellas que parecen ser menores. ? Se siente mareado, somnoliento o que pierde el equilibrio.  Tome los medicamentos de venta libre y los recetados solamente como se lo haya indicado el mdico. Estos incluyen todos los suplementos.  Siga una dieta sana y mantenga un peso saludable. Una dieta saludable incluye productos lcteos descremados, carnes bajas en contenido de grasa (magras, fibra de granos enteros, frijoles y muchas frutas y verduras. La seguridad en el hogar  Retire los objetos que puedan causar tropiezos tales como alfombras, cables u obstculos.  Instale equipos de seguridad, como barras para sostn en los baos y barandas de seguridad en las escaleras.  Mantenga las habitaciones y los pasillos bien iluminados. Actividad   Siga un programa de ejercicio regular para mantenerse en forma. Esto lo ayudar a mantener el equilibrio. Consulte al mdico qu tipos de ejercicios son adecuados para usted.  Si necesita un bastn o un andador, selo segn las recomendaciones del mdico.  Utilice calzado con buen apoyo y suela antideslizante. Estilo de vida  No beba alcohol si el mdico le indica que no beba.  Si bebe alcohol, limite la cantidad que consume: ? De 0 a 1 medida por da para las mujeres. ? De 0 a 2 medidas por da para los hombres.  Est atento a la cantidad de alcohol que contiene su bebida. En los [place] una medida equivale a una botella tpica de cerveza (12 onzas), media copa de vino (5 onzas) o una medida de bebida blanca (1 onza).  No consuma  ningn producto que contenga nicotina o tabaco, como cigarrillos y cigarrillos electrnicos. Si necesita ayuda para dejar de fumar, consulte al mdico. Resumen  Tener un estilo de vida saludable y recibir cuidados preventivos pueden ayudar a promover la salud y el bienestar despus de los 65 aos de edad.  Realizarse pruebas de deteccin y anlisis es la mejor manera de detectar un problema de salud de forma temprana y ayudarlo a evitar una cada. El diagnstico y tratamiento tempranos le brindan la mejor oportunidad de controlar las afecciones mdicas ms comunes en las personas mayores de 65 aos de edad.  Las cadas son la causa principal de las fracturas de huesos y lesiones en la cabeza de personas mayores de 65 aos de edad. Tome precauciones para evitar una cada en su casa.  Trabaje con el mdico para saber qu cambios que puede hacer para mejorar su salud y bienestar, y para prevenir las cadas. Esta informacin no tiene como fin reemplazar el consejo del mdico. Asegrese de hacerle al mdico cualquier pregunta que tenga. Document Revised: 04/02/2017 Document Reviewed: 04/02/2017 Elsevier Patient Education  2020 Elsevier Inc.  

## 2019-07-01 ENCOUNTER — Other Ambulatory Visit: Payer: Self-pay | Admitting: Emergency Medicine

## 2019-07-01 DIAGNOSIS — Z1231 Encounter for screening mammogram for malignant neoplasm of breast: Secondary | ICD-10-CM

## 2019-07-17 DIAGNOSIS — Z1211 Encounter for screening for malignant neoplasm of colon: Secondary | ICD-10-CM | POA: Diagnosis not present

## 2019-07-17 DIAGNOSIS — J449 Chronic obstructive pulmonary disease, unspecified: Secondary | ICD-10-CM | POA: Diagnosis not present

## 2019-07-22 LAB — COLOGUARD: COLOGUARD: NEGATIVE

## 2019-07-25 ENCOUNTER — Encounter: Payer: Self-pay | Admitting: Emergency Medicine

## 2019-07-25 ENCOUNTER — Other Ambulatory Visit: Payer: Self-pay | Admitting: Emergency Medicine

## 2019-07-25 ENCOUNTER — Telehealth: Payer: Self-pay

## 2019-07-25 DIAGNOSIS — J449 Chronic obstructive pulmonary disease, unspecified: Secondary | ICD-10-CM

## 2019-07-25 LAB — COLOGUARD: Cologuard: NEGATIVE

## 2019-07-25 MED ORDER — ALBUTEROL SULFATE (2.5 MG/3ML) 0.083% IN NEBU: 2.5000 mg | INHALATION_SOLUTION | Freq: Four times a day (QID) | RESPIRATORY_TRACT | 3 refills | Status: DC | PRN

## 2019-07-25 NOTE — Telephone Encounter (Signed)
Patient message - Prescription request for Albuterol inhalation solution 0.083 / 2.5 mg /57ml not in chart .

## 2019-07-25 NOTE — Telephone Encounter (Signed)
Prescription sent. Thanks

## 2019-08-03 ENCOUNTER — Other Ambulatory Visit: Payer: Self-pay

## 2019-08-03 ENCOUNTER — Ambulatory Visit
Admission: RE | Admit: 2019-08-03 | Discharge: 2019-08-03 | Disposition: A | Discharge status: Home / Self Care | Payer: Medicare HMO | Source: Ambulatory Visit | Attending: Emergency Medicine | Admitting: Emergency Medicine

## 2019-08-03 DIAGNOSIS — Z1231 Encounter for screening mammogram for malignant neoplasm of breast: Secondary | ICD-10-CM | POA: Diagnosis not present

## 2019-08-04 ENCOUNTER — Encounter: Payer: Self-pay | Admitting: Emergency Medicine

## 2019-08-10 ENCOUNTER — Other Ambulatory Visit: Payer: Self-pay | Admitting: *Deleted

## 2019-08-10 DIAGNOSIS — J449 Chronic obstructive pulmonary disease, unspecified: Secondary | ICD-10-CM

## 2019-08-10 MED ORDER — ALBUTEROL SULFATE (2.5 MG/3ML) 0.083% IN NEBU: 2.5000 mg | INHALATION_SOLUTION | Freq: Four times a day (QID) | RESPIRATORY_TRACT | 3 refills | Status: DC | PRN

## 2019-08-17 DIAGNOSIS — J449 Chronic obstructive pulmonary disease, unspecified: Secondary | ICD-10-CM | POA: Diagnosis not present

## 2019-09-15 DIAGNOSIS — D485 Neoplasm of uncertain behavior of skin: Secondary | ICD-10-CM | POA: Diagnosis not present

## 2019-09-15 DIAGNOSIS — L910 Hypertrophic scar: Secondary | ICD-10-CM | POA: Diagnosis not present

## 2019-09-16 DIAGNOSIS — J449 Chronic obstructive pulmonary disease, unspecified: Secondary | ICD-10-CM | POA: Diagnosis not present

## 2019-09-27 ENCOUNTER — Ambulatory Visit: Payer: Medicare HMO | Admitting: Adult Health

## 2019-10-17 DIAGNOSIS — J449 Chronic obstructive pulmonary disease, unspecified: Secondary | ICD-10-CM | POA: Diagnosis not present

## 2019-11-17 DIAGNOSIS — J449 Chronic obstructive pulmonary disease, unspecified: Secondary | ICD-10-CM | POA: Diagnosis not present

## 2019-11-18 ENCOUNTER — Other Ambulatory Visit: Payer: Self-pay

## 2019-11-18 ENCOUNTER — Other Ambulatory Visit: Payer: Medicare HMO

## 2019-11-18 DIAGNOSIS — Z20822 Contact with and (suspected) exposure to covid-19: Secondary | ICD-10-CM | POA: Diagnosis not present

## 2019-11-21 LAB — NOVEL CORONAVIRUS, NAA: SARS-CoV-2, NAA: NOT DETECTED

## 2019-11-22 DIAGNOSIS — H52203 Unspecified astigmatism, bilateral: Secondary | ICD-10-CM | POA: Diagnosis not present

## 2019-11-22 DIAGNOSIS — H26491 Other secondary cataract, right eye: Secondary | ICD-10-CM | POA: Diagnosis not present

## 2019-12-17 DIAGNOSIS — J449 Chronic obstructive pulmonary disease, unspecified: Secondary | ICD-10-CM | POA: Diagnosis not present

## 2019-12-26 ENCOUNTER — Telehealth: Payer: Self-pay | Admitting: Emergency Medicine

## 2019-12-26 NOTE — Telephone Encounter (Signed)
LVMTCB for appt that was made via MyChart. appt notes just say blood work but tere is not active labs in pts chart. Calling to clarify what appt is about. Please advise.

## 2020-01-17 DIAGNOSIS — J449 Chronic obstructive pulmonary disease, unspecified: Secondary | ICD-10-CM | POA: Diagnosis not present

## 2020-01-19 ENCOUNTER — Ambulatory Visit: Payer: Medicare HMO | Admitting: Emergency Medicine

## 2020-02-09 ENCOUNTER — Encounter: Payer: Self-pay | Admitting: Emergency Medicine

## 2020-02-09 ENCOUNTER — Ambulatory Visit (INDEPENDENT_AMBULATORY_CARE_PROVIDER_SITE_OTHER): Payer: Medicare HMO | Admitting: Emergency Medicine

## 2020-02-09 ENCOUNTER — Other Ambulatory Visit: Payer: Self-pay

## 2020-02-09 VITALS — BP 114/61 | HR 73 | Temp 98.4°F | Resp 16 | Ht 60.0 in | Wt 162.0 lb

## 2020-02-09 DIAGNOSIS — J449 Chronic obstructive pulmonary disease, unspecified: Secondary | ICD-10-CM

## 2020-02-09 DIAGNOSIS — Z8719 Personal history of other diseases of the digestive system: Secondary | ICD-10-CM

## 2020-02-09 DIAGNOSIS — I10 Essential (primary) hypertension: Secondary | ICD-10-CM | POA: Diagnosis not present

## 2020-02-09 DIAGNOSIS — E785 Hyperlipidemia, unspecified: Secondary | ICD-10-CM

## 2020-02-09 DIAGNOSIS — G4733 Obstructive sleep apnea (adult) (pediatric): Secondary | ICD-10-CM

## 2020-02-09 DIAGNOSIS — M159 Polyosteoarthritis, unspecified: Secondary | ICD-10-CM | POA: Diagnosis not present

## 2020-02-09 MED ORDER — SIMVASTATIN 20 MG PO TABS: 20.0000 mg | ORAL_TABLET | Freq: Every day | ORAL | 3 refills | Status: DC

## 2020-02-09 MED ORDER — OMEPRAZOLE 20 MG PO CPDR: 20.0000 mg | DELAYED_RELEASE_CAPSULE | Freq: Every day | ORAL | 3 refills | Status: DC

## 2020-02-09 MED ORDER — ALBUTEROL SULFATE (2.5 MG/3ML) 0.083% IN NEBU: 2.5000 mg | INHALATION_SOLUTION | Freq: Four times a day (QID) | RESPIRATORY_TRACT | 3 refills | Status: DC | PRN

## 2020-02-09 MED ORDER — BREO ELLIPTA 100-25 MCG/INH IN AEPB: 1.0000 | INHALATION_SPRAY | Freq: Every day | RESPIRATORY_TRACT | 7 refills | Status: DC

## 2020-02-09 MED ORDER — MONTELUKAST SODIUM 10 MG PO TABS: 10.0000 mg | ORAL_TABLET | Freq: Every day | ORAL | 3 refills | Status: DC

## 2020-02-09 MED ORDER — NITROGLYCERIN 0.4 MG SL SUBL: 0.4000 mg | SUBLINGUAL_TABLET | SUBLINGUAL | 1 refills | Status: DC | PRN

## 2020-02-09 NOTE — Patient Instructions (Addendum)
   If you have lab work done today you will be contacted with your lab results within the next 2 weeks.  If you have not heard from us then please contact us. The fastest way to get your results is to register for My Chart.   IF you received an x-ray today, you will receive an invoice from [facility]. Please contact [facility] at [phone] with questions or concerns regarding your invoice.   IF you received labwork today, you will receive an invoice from [facility]. Please contact [facility] at [phone] with questions or concerns regarding your invoice.   Our billing staff will not be able to assist you with questions regarding bills from these companies.  You will be contacted with the lab results as soon as they are available. The fastest way to get your results is to activate your My Chart account. Instructions are located on the last page of this paperwork. If you have not heard from us regarding the results in 2 weeks, please contact this office.     Mantenimiento de la salud despus de los 65 aos de edad Health Maintenance After Age 72 Despus de los 65 aos de edad, corre un riesgo mayor de padecer ciertas enfermedades e infecciones a largo plazo, como tambin de sufrir lesiones por cadas. Las cadas son la causa principal de las fracturas de huesos y lesiones en la cabeza de personas mayores de 65 aos de edad. Recibir cuidados preventivos de forma regular puede ayudarlo a mantenerse saludable y en buen estado. Los cuidados preventivos incluyen realizarse anlisis de forma regular y realizar cambios en el estilo de vida segn las recomendaciones del mdico. Converse con el profesional que lo asiste sobre:  Las pruebas de deteccin y los anlisis que debe realizarse. Una prueba de deteccin es un estudio que se para detectar la presencia de una enfermedad cuando no tiene sntomas.  Un plan de dieta y ejercicios adecuado para usted. Qu debo saber sobre las  pruebas de deteccin y los anlisis para prevenir cadas? Realizarse pruebas de deteccin y anlisis es la mejor manera de detectar un problema de salud de forma temprana. El diagnstico y tratamiento tempranos le brindan la mejor oportunidad de controlar las afecciones mdicas que son comunes despus de los 65 aos de edad. Ciertas afecciones y elecciones de estilo de vida pueden hacer que sea ms propenso a sufrir una cada. El mdico puede recomendarle lo siguiente:  Controles regulares de la visin. Una visin deficiente y afecciones como las cataratas pueden hacer que sea ms propenso a sufrir una cada. Si usa lentes, asegrese de obtener una receta actualizada si su visin cambia.  Revisin de medicamentos. Revise regularmente con el mdico todos los medicamentos que toma, incluidos los medicamentos de venta libre. Consulte al mdico sobre los efectos secundarios que pueden hacer que sea ms propenso a sufrir una cada. Informe al mdico si alguno de los medicamentos que toma lo hace sentir mareado o somnoliento.  Pruebas de deteccin para la osteoporosis. La osteoporosis es una afeccin que hace que los huesos se vuelvan ms frgiles. En consecuencia, los huesos pueden debilitarse y quebrarse ms fcilmente.  Pruebas de deteccin para la presin arterial. Los cambios en la presin arterial y los medicamentos para controlar la presin arterial pueden hacerlo sentir mareado.  Controles de fuerza y equilibrio. El mdico puede recomendar ciertos estudios para controlar su fuerza y equilibrio al estar de pie, al caminar o al cambiar de posicin.  Examen de los pies.   El dolor y Chiropractor en los pies, como tambin no utilizar el calzado [place], pueden hacer que sea ms propenso a sufrir Engineer, manufacturing.  Prueba de deteccin de la depresin. Es ms probable que sufra una cada si tiene miedo a caerse, se siente mal emocionalmente o se siente incapaz de Stage manager.  Prueba de deteccin de consumo de alcohol. Beber demasiado alcohol puede afectar su equilibrio y puede hacer que sea ms propenso a sufrir Engineer, manufacturing. Qu medidas puedo tomar para reducir mi riesgo de sufrir una cada? Instrucciones generales  Hable con el mdico sobre sus riesgos de sufrir una cada. Infrmele a su mdico si: ? Se cae. Asegrese de informarle a su mdico acerca de todas las cadas, incluso aquellas que parecen ser [employer] ? Se siente mareado, somnoliento o que pierde el equilibrio.  Tome los medicamentos de venta libre y los recetados solamente como se lo haya indicado el mdico. Estos incluyen todos los suplementos.  Siga una dieta sana y [place] un peso saludable. Una dieta saludable incluye productos lcteos descremados, carnes bajas en contenido de grasa ([place], [place] de granos enteros, frijoles y [place] frutas y verduras. La seguridad en el hogar  Retire los objetos que puedan causar tropiezos tales como alfombras, cables u obstculos.  Instale equipos de seguridad, como barras para sostn en los baos y barandas de seguridad en las escaleras.  Dresser habitaciones y los pasillos bien iluminados. Actividad   Siga un programa de ejercicio regular para mantenerse en forma. Esto lo ayudar a Contractor equilibrio. Consulte al mdico qu tipos de ejercicios son adecuados para usted.  Si necesita un bastn o un andador, selo segn las recomendaciones del mdico.  Utilice calzado con buen apoyo y suela antideslizante. Estilo de vida  No beba alcohol si el mdico le indica que no beba.  Si bebe alcohol, limite la cantidad que consume: ? De 0 a 1 medida por da para las mujeres. ? De 0 a 2 medidas por da para los hombres.  Est atento a la cantidad de alcohol que contiene su bebida. [place] los [place] una medida equivale a una botella tpica de cerveza (12 onzas), media copa de vino (5 onzas) o una medida de bebida blanca (1 onza).  No consuma  ningn producto que contenga nicotina o tabaco, como cigarrillos y Psychologist, sport and exercise. Si necesita ayuda para dejar de fumar, consulte al mdico. Resumen  Tener un estilo de vida saludable y recibir cuidados preventivos pueden ayudar a Theatre stage manager salud y el bienestar despus de los 14 aos de [place].  Realizarse pruebas de deteccin y [employer] es la mejor manera de Hydrographic surveyor un problema de salud de forma temprana y Lourena Simmonds a Product/process development scientist una cada. El diagnstico y tratamiento tempranos le brindan la mejor oportunidad de Chief Technology Officer las afecciones mdicas ms comunes en las personas mayores de 28 aos de edad.  Las cadas son la causa principal de las fracturas de huesos y lesiones en la cabeza de personas mayores de 20 aos de edad. Tome precauciones para evitar una cada en su casa.  Trabaje con el mdico para saber qu cambios que puede hacer para mejorar su salud y [place], y [place]. Esta informacin no tiene Marine scientist el consejo del mdico. Asegrese de hacerle al mdico cualquier pregunta que tenga. Document Revised: 04/02/2017 Document Reviewed: 04/02/2017 Elsevier Patient Education  2020 [employer].

## 2020-02-09 NOTE — Progress Notes (Signed)
Dana Daniels 74 y.o.   Chief Complaint  Patient presents with  . Medication Refill    Pend and lab work     HISTORY OF PRESENT ILLNESS: This is a 74 y.o. female here for follow-up and medication refill. Has history of asthma/COPD.  Uses Breo Ellipta and albuterol as needed.  Doing well. Has history of hypertension and dyslipidemia.  Doing well. Has no complaints or medical concerns today.  HPI   Prior to Admission medications   Medication Sig Start Date End Date Taking? Authorizing Provider  albuterol (PROVENTIL) (2.5 MG/3ML) 0.083% nebulizer solution Take 3 mLs (2.5 mg total) by nebulization every 6 (six) hours as needed for wheezing or shortness of breath. 08/10/19  Yes Rheba Diamond, Ines Bloomer, MD  albuterol (VENTOLIN HFA) 108 (90 Base) MCG/ACT inhaler Inhale 2 puffs into the lungs every 4 (four) hours as needed for wheezing or shortness of breath. 01/13/19  Yes Seyon Strader, Ines Bloomer, MD  benzonatate (TESSALON) 200 MG capsule Take 1 capsule (200 mg total) by mouth 2 (two) times daily as needed for cough. 04/07/19  Yes Davidson Palmieri, Ines Bloomer, MD  fluticasone furoate-vilanterol (BREO ELLIPTA) 100-25 MCG/INH AEPB Inhale 1 puff into the lungs daily. 01/13/19  Yes Ianmichael Amescua, Ines Bloomer, MD  nitroGLYCERIN (NITROSTAT) 0.4 MG SL tablet Place 0.4 mg under the tongue every 5 (five) minutes as needed for chest pain.   Yes [provider]  cetirizine (ZYRTEC) 10 MG tablet Take 1 tablet (10 mg total) by mouth daily for 7 days. 03/31/18 04/07/18  Horald Pollen, MD  cromolyn (NASALCROM) 5.2 MG/ACT nasal spray Place 1 spray into both nostrils 2 (two) times daily. Patient not taking: Reported on 02/09/2020 01/13/19   Horald Pollen, MD  diltiazem (CARDIZEM CD) 240 MG 24 hr capsule Take 1 capsule (240 mg total) by mouth daily. 06/09/19 09/07/19  Horald Pollen, MD  montelukast (SINGULAIR) 10 MG tablet Take 1 tablet (10 mg total) by mouth at bedtime. 05/11/19 08/09/19  Horald Pollen, MD  omeprazole (PRILOSEC) 20 MG capsule Take 1 capsule (20 mg total) by mouth daily. 04/07/19 07/06/19  Horald Pollen, MD  simvastatin (ZOCOR) 20 MG tablet Take 1 tablet (20 mg total) by mouth daily. 04/07/19 07/06/19  Horald Pollen, MD  valACYclovir (VALTREX) 1000 MG tablet Take 1 tablet (1,000 mg total) by mouth 2 (two) times daily. Patient not taking: Reported on 02/09/2020 09/25/17   Horald Pollen, MD    Allergies  Allergen Reactions  . Tramadol Itching    Patient Active Problem List   Diagnosis Date Noted  . History of gastroesophageal reflux (GERD) 09/25/2017  . Osteoarthritis of multiple joints 03/24/2017  . Chronic pain of left knee 03/24/2017  . Chronic obstructive pulmonary disease ([place]) 03/24/2017  . Primary cancer of skin of left foot 02/03/2017  . Mild intermittent asthma without complication 16/12/9602  . Sleep apnea 02/03/2017  . Dyslipidemia 02/03/2017  . Migraine 02/03/2017  . Essential hypertension 09/18/2008  . Allergic rhinitis 09/18/2008    Past Medical History:  Diagnosis Date  . Allergy   . Arthritis   . Asthma   . Blood transfusion without reported diagnosis   . Cataract   . Hyperlipidemia   . Hypertension   . Migraine   . Osteoporosis   . Sleep apnea     Past Surgical History:  Procedure Laterality Date  . ABDOMINAL HYSTERECTOMY    . CHOLECYSTECTOMY    . EYE SURGERY Left    cataract   .  FOOT SURGERY Left     Social History   Socioeconomic History  . Marital status: Widowed    Spouse name: Not on file  . Number of children: Not on file  . Years of education: Not on file  . Highest education level: Not on file  Occupational History  . Not on file  Tobacco Use  . Smoking status: Never Smoker  . Smokeless tobacco: Never Used  Vaping Use  . Vaping Use: Never used  Substance and Sexual Activity  . Alcohol use: No  . Drug use: Never  . Sexual activity: Not on file  Other Topics Concern  . Not on file   Social History Narrative  . Not on file   Social Determinants of Health   Financial Resource Strain: Not on file  Food Insecurity: Not on file  Transportation Needs: Not on file  Physical Activity: Not on file  Stress: Not on file  Social Connections: Not on file  Intimate Partner Violence: Not on file    Family History  Problem Relation Age of Onset  . Heart disease Mother   . Hyperlipidemia Mother   . Hypertension Mother   . Stroke Mother   . Heart disease Father   . Stroke Father   . Hypertension Son   . Hyperlipidemia Son   . Fibromyalgia Son   . Lupus Son   . Hypertension Son   . Diabetes Daughter   . Hypertension Daughter      Review of Systems  Constitutional: Negative.  Negative for chills and fever.  HENT: Negative.  Negative for congestion and sore throat.   Respiratory: Negative.  Negative for cough and shortness of breath.   Cardiovascular: Negative.  Negative for chest pain and palpitations.  Gastrointestinal: Negative.  Negative for abdominal pain, diarrhea, nausea and vomiting.  Genitourinary: Negative.  Negative for dysuria and hematuria.  Skin: Negative.  Negative for rash.  Neurological: Negative.  Negative for dizziness and headaches.  All other systems reviewed and are negative.    Physical Exam Vitals reviewed.  Constitutional:      Appearance: Normal appearance.  HENT:     Head: Normocephalic.  Eyes:     Extraocular Movements: Extraocular movements intact.     Conjunctiva/sclera: Conjunctivae normal.     Pupils: Pupils are equal, round, and reactive to light.  Cardiovascular:     Rate and Rhythm: Normal rate and regular rhythm.     Pulses: Normal pulses.     Heart sounds: Normal heart sounds.  Pulmonary:     Effort: Pulmonary effort is normal.     Breath sounds: Normal breath sounds.  Musculoskeletal:        General: Normal range of motion.     Cervical back: Normal range of motion and neck supple.  Skin:    General: Skin is  warm and dry.     Capillary Refill: Capillary refill takes less than 2 seconds.  Neurological:     General: No focal deficit present.     Mental Status: She is alert and oriented to person, place, and time.  Psychiatric:        Mood and Affect: Mood normal.        Behavior: Behavior normal.     A total of 30 minutes was spent with the patient, greater than 50% of which was in counseling/coordination of care regarding multiple chronic medical problems and cardiovascular risks associated with these conditions, review of most recent blood work results, review of all  medications, review of most recent office visit notes, health maintenance, prognosis, documentation and need for follow-up.  ASSESSMENT & PLAN: Clinically stable.  No medical concerns identified during this visit. Continue present medications.  No changes. Follow-up in 6 months. Dana Daniels was seen today for medication refill.  Diagnoses and all orders for this visit:  Chronic obstructive pulmonary disease, unspecified COPD type ([place]) -     albuterol (PROVENTIL) (2.5 MG/3ML) 0.083% nebulizer solution; Take 3 mLs (2.5 mg total) by nebulization every 6 (six) hours as needed for wheezing or shortness of breath. -     fluticasone furoate-vilanterol (BREO ELLIPTA) 100-25 MCG/INH AEPB; Inhale 1 puff into the lungs daily.  History of gastroesophageal reflux (GERD) -     omeprazole (PRILOSEC) 20 MG capsule; Take 1 capsule (20 mg total) by mouth daily.  Dyslipidemia -     Comprehensive metabolic panel -     Hemoglobin A1c -     Lipid panel -     simvastatin (ZOCOR) 20 MG tablet; Take 1 tablet (20 mg total) by mouth daily.  Essential hypertension  Osteoarthritis of multiple joints, unspecified osteoarthritis type  Obstructive sleep apnea syndrome  Other orders -     nitroGLYCERIN (NITROSTAT) 0.4 MG SL tablet; Place 1 tablet (0.4 mg total) under the tongue every 5 (five) minutes as needed for chest pain. -     montelukast  (SINGULAIR) 10 MG tablet; Take 1 tablet (10 mg total) by mouth at bedtime.    Patient Instructions       If you have lab work done today you will be contacted with your lab results within the next 2 weeks.  If you have not heard from [place] then please contact us. The fastest way to get your results is to register for My Chart.   IF you received an x-ray today, you will receive an invoice from [facility]. Please contact [facility] at [phone] with questions or concerns regarding your invoice.   IF you received labwork today, you will receive an invoice from [facility]. Please contact [facility] at [phone] with questions or concerns regarding your invoice.   Our billing staff will not be able to assist you with questions regarding bills from these companies.  You will be contacted with the lab results as soon as they are available. The fastest way to get your results is to activate your My Chart account. Instructions are located on the last page of this paperwork. If you have not heard from [place] regarding the results in 2 weeks, please contact this office.     Mantenimiento de la salud despus de los 55 aos de edad Health Maintenance After Age 62 Despus de los 65 aos de edad, corre un riesgo mayor de Tourist information centre manager enfermedades e infecciones a Barrister's clerk, como tambin de sufrir lesiones por cadas. Las cadas son la causa principal de las fracturas de huesos y lesiones en la cabeza de personas mayores de 73 aos de edad. Recibir cuidados preventivos de forma regular puede ayudarlo a mantenerse saludable y en buen [place]. Los cuidados preventivos incluyen realizarse anlisis de forma regular y Actor en el estilo de vida segn las recomendaciones del mdico. Converse con el profesional que lo asiste sobre:  Las pruebas de deteccin y los anlisis que debe Dispensing optician. Una prueba de deteccin es un estudio que se para Hydrographic surveyor la presencia de una enfermedad  cuando no tiene sntomas.  Un plan de dieta y ejercicios adecuado para usted. Qu debo saber sobre  las pruebas de deteccin y los anlisis para prevenir cadas? Realizarse pruebas de deteccin y [employer] es la mejor manera de Hydrographic surveyor un problema de salud de forma temprana. El diagnstico y tratamiento tempranos le brindan la mejor oportunidad de Chief Technology Officer las afecciones mdicas que son comunes despus de los 62 aos de edad. Ciertas afecciones y elecciones de estilo de vida pueden hacer que sea ms propenso a sufrir Engineer, manufacturing. El mdico puede recomendarle lo siguiente:  Controles regulares de la visin. Una visin deficiente y afecciones como las cataratas pueden hacer que sea ms propenso a sufrir Engineer, manufacturing. Si [place] lentes, asegrese de obtener una receta actualizada si su visin cambia.  Revisin de medicamentos. Revise regularmente con el mdico todos los medicamentos que toma, incluidos los medicamentos de [place]. Consulte al [employer] efectos secundarios que pueden hacer que sea ms propenso a sufrir Engineer, manufacturing. Informe al mdico si alguno de los medicamentos que toma lo hace sentir mareado o somnoliento.  Pruebas de deteccin para la osteoporosis. La osteoporosis es una afeccin que hace que los huesos se vuelvan ms frgiles. En consecuencia, los huesos pueden debilitarse y quebrarse ms fcilmente.  Pruebas de deteccin para la presin arterial. Los cambios en la presin arterial y los medicamentos para Chief Technology Officer la presin arterial pueden hacerlo sentir mareado.  Controles de fuerza y equilibrio. El mdico puede recomendar ciertos estudios para controlar su fuerza y equilibrio al estar de pie, al caminar o al cambiar de posicin.  Examen de los pies. El dolor y Chiropractor en los pies, como tambin no utilizar el calzado [place], pueden hacer que sea ms propenso a sufrir Engineer, manufacturing.  Prueba de deteccin de la depresin. Es ms probable que sufra una cada si tiene miedo  a caerse, se siente mal emocionalmente o se siente incapaz de Patent examiner.  Prueba de deteccin de consumo de alcohol. Beber demasiado alcohol puede afectar su equilibrio y puede hacer que sea ms propenso a sufrir Engineer, manufacturing. Qu medidas puedo tomar para reducir mi riesgo de sufrir una cada? Instrucciones generales  Hable con el mdico sobre sus riesgos de sufrir una cada. Infrmele a su mdico si: ? Se cae. Asegrese de informarle a su mdico acerca de todas las cadas, incluso aquellas que parecen ser [employer] ? Se siente mareado, somnoliento o que pierde el equilibrio.  Tome los medicamentos de venta libre y los recetados solamente como se lo haya indicado el mdico. Estos incluyen todos los suplementos.  Siga una dieta sana y [place] un peso saludable. Una dieta saludable incluye productos lcteos descremados, carnes bajas en contenido de grasa ([employer], [place] de granos enteros, frijoles y [place] frutas y verduras. La seguridad en el hogar  Retire los objetos que puedan causar tropiezos tales como alfombras, cables u obstculos.  Instale equipos de seguridad, como barras para sostn en los baos y barandas de seguridad en las escaleras.  [place] habitaciones y los pasillos bien iluminados. Actividad   Siga un programa de ejercicio regular para mantenerse en forma. Esto lo ayudar a Contractor equilibrio. Consulte al mdico qu tipos de ejercicios son adecuados para usted.  Si necesita un bastn o un andador, selo segn las recomendaciones del mdico.  Utilice calzado con buen apoyo y suela antideslizante. Estilo de vida  No beba alcohol si el mdico le indica que no beba.  Si bebe alcohol, limite la cantidad que consume: ? De 0 a 1 medida por da para las mujeres. ?  De 0 a 2 medidas por da para los hombres.  Est atento a la cantidad de alcohol que contiene su bebida. En los [place] una medida equivale a una botella tpica de cerveza (12  onzas), media copa de vino (5 onzas) o una medida de bebida blanca (1 onza).  No consuma ningn producto que contenga nicotina o tabaco, como cigarrillos y Psychologist, sport and exercise. Si necesita ayuda para dejar de fumar, consulte al mdico. Resumen  Tener un estilo de vida saludable y recibir cuidados preventivos pueden ayudar a Theatre stage manager salud y el bienestar despus de los 34 aos de [place].  Realizarse pruebas de deteccin y [employer] es la mejor manera de Hydrographic surveyor un problema de salud de forma temprana y Lourena Simmonds a Product/process development scientist una cada. El diagnstico y tratamiento tempranos le brindan la mejor oportunidad de Chief Technology Officer las afecciones mdicas ms comunes en las personas mayores de 101 aos de edad.  Las cadas son la causa principal de las fracturas de huesos y lesiones en la cabeza de personas mayores de 74 aos de edad. Tome precauciones para evitar una cada en su casa.  Trabaje con el mdico para saber qu cambios que puede hacer para mejorar su salud y [place], y [place]. Esta informacin no tiene Marine scientist el consejo del mdico. Asegrese de hacerle al mdico cualquier pregunta que tenga. Document Revised: 04/02/2017 Document Reviewed: 04/02/2017 Elsevier Patient Education  2020 Elsevier Inc.      Agustina Caroli, MD [facility]

## 2020-02-10 LAB — LIPID PANEL
Chol/HDL Ratio: 2.9 ratio (ref 0.0–4.4)
Cholesterol, Total: 158 mg/dL (ref 100–199)
HDL: 54 mg/dL (ref >39)
LDL Chol Calc (NIH): 88 mg/dL (ref 0–99)
Triglycerides: 88 mg/dL (ref 0–149)
VLDL Cholesterol Cal: 16 mg/dL (ref 5–40)

## 2020-02-10 LAB — COMPREHENSIVE METABOLIC PANEL
ALT: 15 IU/L (ref 0–32)
AST: 12 IU/L (ref 0–40)
Albumin/Globulin Ratio: 1.8 (ref 1.2–2.2)
Albumin: 4.5 g/dL (ref 3.7–4.7)
Alkaline Phosphatase: 113 IU/L (ref 44–121)
BUN/Creatinine Ratio: 16 (ref 12–28)
BUN: 12 mg/dL (ref 8–27)
Bilirubin Total: 0.2 mg/dL (ref 0.0–1.2)
CO2: 24 mmol/L (ref 20–29)
Calcium: 9.1 mg/dL (ref 8.7–10.3)
Chloride: 104 mmol/L (ref 96–106)
Creatinine, Ser: 0.74 mg/dL (ref 0.57–1.00)
GFR calc Af Amer: 94 mL/min/{1.73_m2} (ref >59)
GFR calc non Af Amer: 81 mL/min/{1.73_m2} (ref >59)
Globulin, Total: 2.5 g/dL (ref 1.5–4.5)
Glucose: 101 mg/dL — ABNORMAL HIGH (ref 65–99)
Potassium: 4.3 mmol/L (ref 3.5–5.2)
Sodium: 143 mmol/L (ref 134–144)
Total Protein: 7 g/dL (ref 6.0–8.5)

## 2020-02-10 LAB — HEMOGLOBIN A1C
Est. average glucose Bld gHb Est-mCnc: 126 mg/dL
Hgb A1c MFr Bld: 6 % — ABNORMAL HIGH (ref 4.8–5.6)

## 2020-03-06 ENCOUNTER — Other Ambulatory Visit: Payer: Self-pay | Admitting: Emergency Medicine

## 2020-03-06 DIAGNOSIS — J449 Chronic obstructive pulmonary disease, unspecified: Secondary | ICD-10-CM

## 2020-05-02 ENCOUNTER — Ambulatory Visit: Payer: Medicare HMO | Admitting: Emergency Medicine

## 2020-05-16 ENCOUNTER — Encounter: Payer: Self-pay | Admitting: Emergency Medicine

## 2020-05-16 ENCOUNTER — Other Ambulatory Visit: Payer: Self-pay | Admitting: Emergency Medicine

## 2020-05-16 DIAGNOSIS — I10 Essential (primary) hypertension: Secondary | ICD-10-CM

## 2020-05-16 NOTE — Telephone Encounter (Signed)
Requested medication (s) are due for refill today: expired medications  Requested medication (s) are on the active medication list: yes  Last refill:  singular- 02/09/20- 05/09/20 #90 3 refills , cardizem- 06/09/19-09/07/19 #90 3 refills   Future visit scheduled: yes in 2 months   Notes to clinic:  expired medications, do you want to renew Rx?     Requested Prescriptions  Pending Prescriptions Disp Refills   montelukast (SINGULAIR) 10 MG tablet [Pharmacy Med Name: Montelukast Sodium 10 MG Oral Tablet] 90 tablet 0    Sig: TAKE 1 TABLET BY MOUTH AT BEDTIME      Pulmonology:  Leukotriene Inhibitors Passed - 05/16/2020  4:57 PM      Passed - Valid encounter within last 12 months    Recent Outpatient Visits           3 months ago Chronic obstructive pulmonary disease, unspecified COPD type ([place])   Primary Care at [facility], Ines Bloomer, MD   10 months ago Axillary lymphadenopathy   Primary Care at [facility], Ines Bloomer, MD   1 year ago Left hip pain   Primary Care at [facility], Ines Bloomer, MD   1 year ago Need for prophylactic vaccination and inoculation against influenza   Primary Care at [facility], [place], [place]   2 years ago Essential hypertension   Primary Care at [place], [place], MD       Future Appointments             In 2 months [place], Ines Bloomer, MD [place] at [facility]               diltiazem (CARDIZEM CD) 240 MG 24 hr capsule [Pharmacy Med Name: dilTIAZem HCl ER Coated Beads 240 MG Oral Capsule Extended Release 24 Hour] 90 capsule 0    Sig: Take 1 capsule by mouth once daily      Cardiovascular:  Calcium Channel Blockers Passed - 05/16/2020  4:57 PM      Passed - Last BP in normal range    BP Readings from Last 1 Encounters:  02/09/20 114/61          Passed - Valid encounter within last 6 months    Recent Outpatient Visits           3 months ago Chronic obstructive pulmonary disease, unspecified COPD  type ([place])   Primary Care at [facility], Ines Bloomer, MD   10 months ago Axillary lymphadenopathy   Primary Care at [place], Ines Bloomer, MD   1 year ago Left hip pain   Primary Care at [place], Ines Bloomer, MD   1 year ago Need for prophylactic vaccination and inoculation against influenza   Primary Care at [facility], Ines Bloomer, MD   2 years ago Essential hypertension   Primary Care at [facility], Ines Bloomer, MD       Future Appointments             In 2 months [place], Ines Bloomer, MD [place] at [facility]

## 2020-05-18 ENCOUNTER — Other Ambulatory Visit: Payer: Self-pay | Admitting: Emergency Medicine

## 2020-05-18 DIAGNOSIS — I10 Essential (primary) hypertension: Secondary | ICD-10-CM

## 2020-05-18 MED ORDER — DILTIAZEM HCL ER COATED BEADS 240 MG PO CP24: 240.0000 mg | ORAL_CAPSULE | Freq: Every day | ORAL | 3 refills | Status: DC

## 2020-05-18 MED ORDER — MONTELUKAST SODIUM 10 MG PO TABS: 10.0000 mg | ORAL_TABLET | Freq: Every day | ORAL | 3 refills | Status: DC

## 2020-05-18 NOTE — Telephone Encounter (Signed)
Refills sent. Thanks! 

## 2020-06-29 ENCOUNTER — Other Ambulatory Visit: Payer: Self-pay | Admitting: Emergency Medicine

## 2020-06-29 DIAGNOSIS — J449 Chronic obstructive pulmonary disease, unspecified: Secondary | ICD-10-CM

## 2020-07-02 MED ORDER — ALBUTEROL SULFATE (2.5 MG/3ML) 0.083% IN NEBU: 2.5000 mg | INHALATION_SOLUTION | Freq: Four times a day (QID) | RESPIRATORY_TRACT | 3 refills | Status: DC | PRN

## 2020-07-24 ENCOUNTER — Ambulatory Visit (INDEPENDENT_AMBULATORY_CARE_PROVIDER_SITE_OTHER): Payer: 59 | Admitting: Emergency Medicine

## 2020-07-24 ENCOUNTER — Encounter: Payer: Self-pay | Admitting: Emergency Medicine

## 2020-07-24 ENCOUNTER — Other Ambulatory Visit: Payer: Self-pay

## 2020-07-24 VITALS — BP 118/78 | HR 77 | Temp 97.7°F | Ht 60.0 in | Wt 169.0 lb

## 2020-07-24 DIAGNOSIS — G4733 Obstructive sleep apnea (adult) (pediatric): Secondary | ICD-10-CM

## 2020-07-24 DIAGNOSIS — J449 Chronic obstructive pulmonary disease, unspecified: Secondary | ICD-10-CM

## 2020-07-24 DIAGNOSIS — M159 Polyosteoarthritis, unspecified: Secondary | ICD-10-CM

## 2020-07-24 DIAGNOSIS — E785 Hyperlipidemia, unspecified: Secondary | ICD-10-CM

## 2020-07-24 DIAGNOSIS — I10 Essential (primary) hypertension: Secondary | ICD-10-CM

## 2020-07-24 DIAGNOSIS — Z8719 Personal history of other diseases of the digestive system: Secondary | ICD-10-CM

## 2020-07-24 DIAGNOSIS — C4922 Malignant neoplasm of connective and soft tissue of left lower limb, including hip: Secondary | ICD-10-CM

## 2020-07-24 LAB — COMPREHENSIVE METABOLIC PANEL
ALT: 18 U/L (ref 0–35)
AST: 16 U/L (ref 0–37)
Albumin: 4.3 g/dL (ref 3.5–5.2)
Alkaline Phosphatase: 79 U/L (ref 39–117)
BUN: 11 mg/dL (ref 6–23)
CO2: 27 mEq/L (ref 19–32)
Calcium: 9.2 mg/dL (ref 8.4–10.5)
Chloride: 105 mEq/L (ref 96–112)
Creatinine, Ser: 0.68 mg/dL (ref 0.40–1.20)
GFR: 86.73 mL/min (ref >60.00)
Glucose, Bld: 98 mg/dL (ref 70–99)
Potassium: 4.1 mEq/L (ref 3.5–5.1)
Sodium: 141 mEq/L (ref 135–145)
Total Bilirubin: 0.3 mg/dL (ref 0.2–1.2)
Total Protein: 7.1 g/dL (ref 6.0–8.3)

## 2020-07-24 LAB — LIPID PANEL
Cholesterol: 149 mg/dL (ref 0–200)
HDL: 52.5 mg/dL (ref >39.00)
LDL Cholesterol: 69 mg/dL (ref 0–99)
NonHDL: 96.04
Total CHOL/HDL Ratio: 3
Triglycerides: 134 mg/dL (ref 0.0–149.0)
VLDL: 26.8 mg/dL (ref 0.0–40.0)

## 2020-07-24 LAB — CBC WITH DIFFERENTIAL/PLATELET
Basophils Absolute: 0 10*3/uL (ref 0.0–0.1)
Basophils Relative: 0.5 % (ref 0.0–3.0)
Eosinophils Absolute: 0.2 10*3/uL (ref 0.0–0.7)
Eosinophils Relative: 2 % (ref 0.0–5.0)
HCT: 42.8 % (ref 36.0–46.0)
Hemoglobin: 14.1 g/dL (ref 12.0–15.0)
Lymphocytes Relative: 48.9 % — ABNORMAL HIGH (ref 12.0–46.0)
Lymphs Abs: 4.5 10*3/uL — ABNORMAL HIGH (ref 0.7–4.0)
MCHC: 33 g/dL (ref 30.0–36.0)
MCV: 84.1 fl (ref 78.0–100.0)
Monocytes Absolute: 0.7 10*3/uL (ref 0.1–1.0)
Monocytes Relative: 7.4 % (ref 3.0–12.0)
Neutro Abs: 3.8 10*3/uL (ref 1.4–7.7)
Neutrophils Relative %: 41.2 % — ABNORMAL LOW (ref 43.0–77.0)
Platelets: 294 10*3/uL (ref 150.0–400.0)
RBC: 5.09 Mil/uL (ref 3.87–5.11)
RDW: 14.3 % (ref 11.5–15.5)
WBC: 9.2 10*3/uL (ref 4.0–10.5)

## 2020-07-24 LAB — HEMOGLOBIN A1C: Hgb A1c MFr Bld: 6 % (ref 4.6–6.5)

## 2020-07-24 MED ORDER — OMEPRAZOLE 40 MG PO CPDR: 40.0000 mg | DELAYED_RELEASE_CAPSULE | Freq: Every day | ORAL | 3 refills | Status: DC

## 2020-07-24 MED ORDER — SIMVASTATIN 20 MG PO TABS: 20.0000 mg | ORAL_TABLET | Freq: Every day | ORAL | 3 refills | Status: DC

## 2020-07-24 MED ORDER — ALBUTEROL SULFATE HFA 108 (90 BASE) MCG/ACT IN AERS: 2.0000 | INHALATION_SPRAY | RESPIRATORY_TRACT | 3 refills | Status: DC | PRN

## 2020-07-24 NOTE — Patient Instructions (Signed)
Mantenimiento de la salud despus de los 22 aos de edad Health Maintenance After Age 73 Despus de los 65 aos de edad, corre un riesgo mayor de Tourist information centre manager enfermedades e infecciones a Barrister's clerk, como tambin de sufrir lesiones por cadas. Las cadas son la causa principal de las fracturas de huesos y lesiones en la cabeza de personas mayores de 83 aos de edad. Recibir cuidados preventivos de forma regular puede ayudarlo a mantenerse saludable y en buen [place]. Los cuidados preventivos incluyen realizarse anlisis de forma regular y Actor en el estilo de vida segn las recomendaciones del mdico. Converse con el profesional que lo asiste sobre:  Las pruebas de deteccin y los anlisis que debe Dispensing optician. Una prueba de deteccin es un estudio que se para Hydrographic surveyor la presencia de una enfermedad cuando no tiene sntomas.  Un plan de dieta y ejercicios adecuado para usted. Qu debo saber sobre las pruebas de deteccin y los anlisis para prevenir cadas? Realizarse pruebas de deteccin y [employer] es la mejor manera de Hydrographic surveyor un problema de salud de forma temprana. El diagnstico y tratamiento tempranos le brindan la mejor oportunidad de Chief Technology Officer las afecciones mdicas que son comunes despus de los 77 aos de edad. Ciertas afecciones y elecciones de estilo de vida pueden hacer que sea ms propenso a sufrir Engineer, manufacturing. El mdico puede recomendarle lo siguiente:  Controles regulares de la visin. Una visin deficiente y afecciones como las cataratas pueden hacer que sea ms propenso a sufrir Engineer, manufacturing. Si [place] lentes, asegrese de obtener una receta actualizada si su visin cambia.  Revisin de medicamentos. Revise regularmente con el mdico todos los medicamentos que toma, incluidos los medicamentos de [place]. Consulte al [employer] efectos secundarios que pueden hacer que sea ms propenso a sufrir Engineer, manufacturing. Informe al mdico si alguno de los medicamentos que toma lo hace  sentir mareado o somnoliento.  Pruebas de deteccin para la osteoporosis. La osteoporosis es una afeccin que hace que los huesos se vuelvan ms frgiles. En consecuencia, los huesos pueden debilitarse y quebrarse ms fcilmente.  Pruebas de deteccin para la presin arterial. Los cambios en la presin arterial y los medicamentos para Chief Technology Officer la presin arterial pueden hacerlo sentir mareado.  Controles de fuerza y equilibrio. El mdico puede recomendar ciertos estudios para controlar su fuerza y equilibrio al estar de pie, al caminar o al cambiar de posicin.  Examen de los pies. El dolor y Chiropractor en los pies, como tambin no utilizar el calzado [place], pueden hacer que sea ms propenso a sufrir Engineer, manufacturing.  Prueba de deteccin de la depresin. Es ms probable que sufra una cada si tiene miedo a caerse, se siente mal emocionalmente o se siente incapaz de Patent examiner.  Prueba de deteccin de consumo de alcohol. Beber demasiado alcohol puede afectar su equilibrio y puede hacer que sea ms propenso a sufrir Engineer, manufacturing. Qu medidas puedo tomar para reducir mi riesgo de sufrir una cada? Instrucciones generales  Hable con el mdico sobre sus riesgos de sufrir una cada. Infrmele a su mdico si: ? Se cae. Asegrese de informarle a su mdico acerca de todas las cadas, incluso aquellas que parecen ser [employer] ? Se siente mareado, somnoliento o que pierde el equilibrio.  Tome los medicamentos de venta libre y los recetados solamente como se lo haya indicado el mdico. Estos incluyen todos los suplementos.  Siga una dieta sana y [place] un peso saludable. Una dieta saludable incluye  productos lcteos descremados, carnes bajas en contenido de grasa ([place], fibra de granos enteros, frijoles y [place] frutas y verduras. La seguridad en el hogar  Retire los objetos que puedan causar tropiezos tales como alfombras, cables u obstculos.  Instale equipos de  seguridad, como barras para sostn en los baos y barandas de seguridad en las escaleras.  [place] habitaciones y los pasillos bien iluminados. Actividad  Siga un programa de ejercicio regular para mantenerse en forma. Esto lo ayudar a Contractor equilibrio. Consulte al mdico qu tipos de ejercicios son adecuados para usted.  Si necesita un bastn o un andador, selo segn las recomendaciones del mdico.  Utilice calzado con buen apoyo y suela antideslizante.   Estilo de vida  No beba alcohol si el mdico le indica que no beba.  Si bebe alcohol, limite la cantidad que consume: ? De 0 a 1 medida por da para las mujeres. ? De 0 a 2 medidas por da para los hombres.  Est atento a la cantidad de alcohol que contiene su bebida. En los [place] una medida equivale a una botella tpica de cerveza (12 onzas), media copa de vino (5 onzas) o una medida de bebida blanca (1 onza).  No consuma ningn producto que contenga nicotina o tabaco, como cigarrillos y Psychologist, sport and exercise. Si necesita ayuda para dejar de fumar, consulte al mdico. Resumen  Tener un estilo de vida saludable y recibir cuidados preventivos pueden ayudar a Theatre stage manager salud y el bienestar despus de los 30 aos de [place].  Realizarse pruebas de deteccin y [employer] es la mejor manera de Hydrographic surveyor un problema de salud de forma temprana y Lourena Simmonds a Product/process development scientist una cada. El diagnstico y tratamiento tempranos le brindan la mejor oportunidad de Chief Technology Officer las afecciones mdicas ms comunes en las personas mayores de 76 aos de edad.  Las cadas son la causa principal de las fracturas de huesos y lesiones en la cabeza de personas mayores de 28 aos de edad. Tome precauciones para evitar una cada en su casa.  Trabaje con el mdico para saber qu cambios que puede hacer para mejorar su salud y [place], y [place]. Esta informacin no tiene Marine scientist el consejo del mdico. Asegrese de hacerle al  mdico cualquier pregunta que tenga. Document Revised: 04/02/2017 Document Reviewed: 04/02/2017 Elsevier Patient Education  2021 [employer].

## 2020-07-24 NOTE — Assessment & Plan Note (Signed)
Well-controlled hypertension.  Continue Cardizem CD 240 mg daily.

## 2020-07-24 NOTE — Assessment & Plan Note (Signed)
Well-controlled on Breo Ellipta with infrequent use of albuterol inhaler

## 2020-07-24 NOTE — Assessment & Plan Note (Signed)
Diet and nutrition discussed. Continue simvastatin 20 mg daily. 

## 2020-07-24 NOTE — Assessment & Plan Note (Signed)
Well-controlled with good functional capacity.

## 2020-07-24 NOTE — Assessment & Plan Note (Signed)
Stable on CPAP machine.

## 2020-07-24 NOTE — Progress Notes (Signed)
Dana Daniels 75 y.o.   Chief Complaint  Patient presents with  . Medication Refill    Pt here for 6 month check and medication refills on albuterol, omeprazole and simvastatin.    HISTORY OF PRESENT ILLNESS: This is a 75 y.o. female with history of multiple chronic medical problems here for follow-up and medication refills. 1.  History of COPD: Stable on Breo Ellipta and albuterol inhaler as needed 2.  Hypertension: Well-controlled on Cardizem CD 240 mg daily 3.  History of GERD: Requesting medication be changed to omeprazole 40 mg instead of 20 mg 4.  History of multiple allergies: Well-controlled 5.  Dyslipidemia: On simvastatin 20 mg daily. 6.  Osteoarthritis of multiple places: Well-controlled.  Very functional. 7.  Had cardiac cath done 3 years ago in [place], [place] which showed minimal coronary artery disease. Was prescribed nitroglycerin but no recent use. No other complaints or medical concerns today.  HPI   Prior to Admission medications   Medication Sig Start Date End Date Taking? Authorizing Provider  diltiazem (CARDIZEM CD) 240 MG 24 hr capsule Take 1 capsule (240 mg total) by mouth daily. 05/18/20 08/16/20 Yes Edith Groleau, Ines Bloomer, MD  fluticasone furoate-vilanterol (BREO ELLIPTA) 100-25 MCG/INH AEPB Inhale 1 puff into the lungs daily. 02/09/20  Yes Naftuli Dalsanto, Ines Bloomer, MD  montelukast (SINGULAIR) 10 MG tablet Take 1 tablet (10 mg total) by mouth at bedtime. 05/18/20 08/16/20 Yes Kamela Blansett, Ines Bloomer, MD  nitroGLYCERIN (NITROSTAT) 0.4 MG SL tablet Place 1 tablet (0.4 mg total) under the tongue every 5 (five) minutes as needed for chest pain. 02/09/20  Yes Chue Berkovich, Ines Bloomer, MD  omeprazole (PRILOSEC) 40 MG capsule Take 1 capsule (40 mg total) by mouth daily. 07/24/20  Yes Kawthar Ennen, Ines Bloomer, MD  albuterol (PROVENTIL) (2.5 MG/3ML) 0.083% nebulizer solution Take 3 mLs (2.5 mg total) by nebulization every 6 (six) hours as needed for wheezing or shortness of  breath. 07/02/20   Horald Pollen, MD  albuterol (VENTOLIN HFA) 108 (90 Base) MCG/ACT inhaler INHALE 2 PUFFS BY MOUTH EVERY 4 HOURS AS NEEDED FOR WHEEZING FOR SHORTNESS OF BREATH 03/07/20   Horald Pollen, MD  benzonatate (TESSALON) 200 MG capsule Take 1 capsule (200 mg total) by mouth 2 (two) times daily as needed for cough. 04/07/19   Horald Pollen, MD  cetirizine (ZYRTEC) 10 MG tablet Take 1 tablet (10 mg total) by mouth daily for 7 days. 03/31/18 04/07/18  Horald Pollen, MD  cromolyn (NASALCROM) 5.2 MG/ACT nasal spray Place 1 spray into both nostrils 2 (two) times daily. Patient not taking: No sig reported 01/13/19   Horald Pollen, MD  simvastatin (ZOCOR) 20 MG tablet Take 1 tablet (20 mg total) by mouth daily. 07/24/20 10/22/20  Horald Pollen, MD  valACYclovir (VALTREX) 1000 MG tablet Take 1 tablet (1,000 mg total) by mouth 2 (two) times daily. Patient not taking: No sig reported 09/25/17   Horald Pollen, MD    Allergies  Allergen Reactions  . Tramadol Itching    Patient Active Problem List   Diagnosis Date Noted  . 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 History of gastroesophageal reflux (GERD) 09/25/2017  . Osteoarthritis of multiple joints 03/24/2017  . Chronic pain of left knee 03/24/2017  . Chronic obstructive pulmonary disease ([place]) 03/24/2017  . Primary cancer of skin of left foot 02/03/2017  . Mild intermittent asthma without complication 26/94/8546  . Sleep apnea 02/03/2017  . Dyslipidemia  02/03/2017  . Migraine 02/03/2017  . Essential hypertension 09/18/2008  . Allergic rhinitis 09/18/2008    Past Medical History:  Diagnosis Date  . Allergy   . Arthritis   . Asthma   . Blood transfusion without reported diagnosis   . Cataract   . Hyperlipidemia   . Hypertension   . Migraine   . Osteoporosis   . Sleep apnea     Past Surgical History:  Procedure Laterality Date  . ABDOMINAL  HYSTERECTOMY    . CHOLECYSTECTOMY    . EYE SURGERY Left    cataract   . FOOT SURGERY Left     Social History   Socioeconomic History  . Marital status: Widowed    Spouse name: Not on file  . Number of children: Not on file  . Years of education: Not on file  . Highest education level: Not on file  Occupational History  . Not on file  Tobacco Use  . Smoking status: Never Smoker  . Smokeless tobacco: Never Used  Vaping Use  . Vaping Use: Never used  Substance and Sexual Activity  . Alcohol use: No  . Drug use: Never  . Sexual activity: Not on file  Other Topics Concern  . Not on file  Social History Narrative  . Not on file   Social Determinants of Health   Financial Resource Strain: Not on file  Food Insecurity: Not on file  Transportation Needs: Not on file  Physical Activity: Not on file  Stress: Not on file  Social Connections: Not on file  Intimate Partner Violence: Not on file    Family History  Problem Relation Age of Onset  . Heart disease Mother   . Hyperlipidemia Mother   . Hypertension Mother   . Stroke Mother   . Heart disease Father   . Stroke Father   . Hypertension Son   . Hyperlipidemia Son   . Fibromyalgia Son   . Lupus Son   . Hypertension Son   . Diabetes Daughter   . Hypertension Daughter      Review of Systems  Constitutional: Negative.  Negative for chills and fever.  HENT: Negative.  Negative for congestion and sore throat.   Respiratory: Negative.  Negative for shortness of breath.   Cardiovascular: Negative.  Negative for chest pain and palpitations.  Gastrointestinal: Negative.  Negative for abdominal pain, diarrhea, nausea and vomiting.  Genitourinary: Negative.  Negative for dysuria and hematuria.  Musculoskeletal: Negative.  Negative for back pain, myalgias and neck pain.  Skin: Negative.   Neurological: Negative.  Negative for dizziness and headaches.  All other systems reviewed and are negative.    Today's Vitals    07/24/20 0823  BP: 118/78  Pulse: 77  Temp: 97.7 F (36.5 C)  TempSrc: Oral  SpO2: 97%  Weight: 169 lb (76.7 kg)  Height: 5' (1.524 m)   Body mass index is 33.01 kg/m. Wt Readings from Last 3 Encounters:  07/24/20 169 lb (76.7 kg)  02/09/20 162 lb (73.5 kg)  06/30/19 165 lb (74.8 kg)     Physical Exam Vitals reviewed.  Constitutional:      Appearance: Normal appearance.  HENT:     Head: Normocephalic.  Eyes:     Extraocular Movements: Extraocular movements intact.     Conjunctiva/sclera: Conjunctivae normal.     Pupils: Pupils are equal, round, and reactive to light.  Cardiovascular:     Rate and Rhythm: Normal rate and regular rhythm.  Pulses: Normal pulses.     Heart sounds: Normal heart sounds.  Pulmonary:     Effort: Pulmonary effort is normal.     Breath sounds: Normal breath sounds.  Musculoskeletal:     Cervical back: Normal range of motion and neck supple. No tenderness.     Right lower leg: No edema.     Left lower leg: No edema.  Lymphadenopathy:     Cervical: No cervical adenopathy.  Skin:    General: Skin is warm and dry.     Capillary Refill: Capillary refill takes less than 2 seconds.  Neurological:     General: No focal deficit present.     Mental Status: She is alert and oriented to person, place, and time.  Psychiatric:        Mood and Affect: Mood normal.        Behavior: Behavior normal.      ASSESSMENT & PLAN: A total of 30 minutes was spent with the patient and counseling/coordination of care regarding multiple chronic medical problems and cardiovascular risks associated with these conditions, review of all medications, review of most recent office visit notes, review of most recent blood work results, health maintenance items, education on nutrition, prognosis, documentation and need for follow-up.  Essential hypertension Well-controlled hypertension.  Continue Cardizem CD 240 mg daily.  Chronic obstructive pulmonary disease  (HCC) Well-controlled on Breo Ellipta with infrequent use of albuterol inhaler  Osteoarthritis of multiple joints Well-controlled with good functional capacity.  Dyslipidemia Diet and nutrition discussed.  Continue simvastatin 20 mg daily.  History of gastroesophageal reflux (GERD) Having intermittent episodes.  Will increase dose of omeprazole to 40 mg daily as requested by patient.  Sleep apnea Stable on CPAP machine.  Adilynn was seen today for medication refill.  Diagnoses and all orders for this visit:  Chronic obstructive pulmonary disease, unspecified COPD type ([place]) -     CBC with Differential/Platelet -     albuterol (VENTOLIN HFA) 108 (90 Base) MCG/ACT inhaler; Inhale 2 puffs into the lungs every 4 (four) hours as needed for wheezing or shortness of breath.  History of gastroesophageal reflux (GERD) -     omeprazole (PRILOSEC) 40 MG capsule; Take 1 capsule (40 mg total) by mouth daily.  Osteoarthritis of multiple joints, unspecified osteoarthritis type  Dyslipidemia -     CBC with Differential/Platelet -     Comprehensive metabolic panel -     Hemoglobin A1c -     Lipid panel -     simvastatin (ZOCOR) 20 MG tablet; Take 1 tablet (20 mg total) by mouth daily.  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 Las cadas son la causa principal de las fracturas de huesos y lesiones en la cabeza de personas mayores de 50 aos de edad. Recibir cuidados preventivos de forma regular puede ayudarlo a mantenerse saludable y en buen [place]. Los cuidados  preventivos incluyen realizarse anlisis de forma regular y Actor en el estilo de vida segn las recomendaciones del mdico. Converse con el profesional que lo asiste sobre:  Las pruebas de Programme researcher, broadcasting/film/video y los anlisis que debe Dispensing optician. Una prueba de deteccin es un estudio que se para Hydrographic surveyor la presencia de una enfermedad cuando no tiene sntomas.  Un plan de dieta y ejercicios adecuado para usted. Qu debo saber sobre las pruebas de deteccin y los anlisis para prevenir cadas? Realizarse pruebas de deteccin y [employer] es la mejor manera de Hydrographic surveyor un problema de salud de forma temprana. El diagnstico y tratamiento tempranos le brindan la mejor oportunidad de Chief Technology Officer las afecciones mdicas que son comunes despus de los 69 aos de edad. Ciertas afecciones y elecciones de estilo de vida pueden hacer que sea ms propenso a sufrir Engineer, manufacturing. El mdico puede recomendarle lo siguiente:  Controles regulares de la visin. Una visin deficiente y afecciones como las cataratas pueden hacer que sea ms propenso a sufrir Engineer, manufacturing. Si [place] lentes, asegrese de obtener una receta actualizada si su visin cambia.  Revisin de medicamentos. Revise regularmente con el mdico todos los medicamentos que toma, incluidos los medicamentos de [place]. Consulte al [employer] efectos secundarios que pueden hacer que sea ms propenso a sufrir Engineer, manufacturing. Informe al mdico si alguno de los medicamentos que toma lo hace sentir mareado o somnoliento.  Pruebas de deteccin para la osteoporosis. La osteoporosis es una afeccin que hace que los huesos se vuelvan ms frgiles. En consecuencia, los huesos pueden debilitarse y quebrarse ms fcilmente.  Pruebas de deteccin para la presin arterial. Los cambios en la presin arterial y los medicamentos para Chief Technology Officer la presin arterial pueden hacerlo sentir mareado.  Controles de fuerza y equilibrio. El mdico puede recomendar ciertos estudios para  controlar su fuerza y equilibrio al estar de pie, al caminar o al cambiar de posicin.  Examen de los pies. El dolor y Chiropractor en los pies, como tambin no utilizar el calzado [place], pueden hacer que sea ms propenso a sufrir Engineer, manufacturing.  Prueba de deteccin de la depresin. Es ms probable que sufra una cada si tiene miedo a caerse, se siente mal emocionalmente o se siente incapaz de Patent examiner.  Prueba de deteccin de consumo de alcohol. Beber demasiado alcohol puede afectar su equilibrio y puede hacer que sea ms propenso a sufrir Engineer, manufacturing. Qu medidas puedo tomar para reducir mi riesgo de sufrir una cada? Instrucciones generales  Hable con el mdico sobre sus riesgos de sufrir una cada. Infrmele a su mdico si: ? Se cae. Asegrese de informarle a su mdico acerca de todas las cadas, incluso aquellas que parecen ser [employer] ? Se siente mareado, somnoliento o que pierde el equilibrio.  Tome los medicamentos de venta libre y los recetados solamente como se lo haya indicado el mdico. Estos incluyen todos los suplementos.  Siga una dieta sana y [place] un peso saludable. Una dieta saludable incluye productos lcteos descremados, carnes bajas en contenido de grasa ([place], [place] de granos enteros, frijoles y [place] frutas y verduras. La seguridad en el hogar  Retire los objetos que puedan causar tropiezos tales como alfombras, cables u obstculos.  Instale equipos de seguridad, como barras para sostn en los baos y barandas de seguridad en las escaleras.  [place] habitaciones y los pasillos bien iluminados. Actividad  Siga un programa de ejercicio regular para mantenerse en forma. Esto lo ayudar a Contractor equilibrio. Consulte al mdico qu tipos de ejercicios son adecuados para usted.  Si necesita un bastn o un andador, selo segn las recomendaciones del mdico.  [place] [facility]  calzado con buen apoyo y suela antideslizante.   Estilo  de vida  No beba alcohol si el mdico le indica que no beba.  Si bebe alcohol, limite la cantidad que consume: ? De 0 a 1 medida por da para las mujeres. ? De 0 a 2 medidas por da para los hombres.  Est atento a la cantidad de alcohol que contiene su bebida. En los [place] una medida equivale a una botella tpica de cerveza (12 onzas), media copa de vino (5 onzas) o una medida de bebida blanca (1 onza).  No consuma ningn producto que contenga nicotina o tabaco, como cigarrillos y Psychologist, sport and exercise. Si necesita ayuda para dejar de fumar, consulte al mdico. Resumen  Tener un estilo de vida saludable y recibir cuidados preventivos pueden ayudar a Theatre stage manager salud y el bienestar despus de los 76 aos de [place].  Realizarse pruebas de deteccin y [employer] es la mejor manera de Hydrographic surveyor un problema de salud de forma temprana y Lourena Simmonds a Product/process development scientist una cada. El diagnstico y tratamiento tempranos le brindan la mejor oportunidad de Chief Technology Officer las afecciones mdicas ms comunes en las personas mayores de 24 aos de edad.  Las cadas son la causa principal de las fracturas de huesos y lesiones en la cabeza de personas mayores de 44 aos de edad. Tome precauciones para evitar una cada en su casa.  Trabaje con el mdico para saber qu cambios que puede hacer para mejorar su salud y [place], y [place]. Esta informacin no tiene Marine scientist el consejo del mdico. Asegrese de hacerle al mdico cualquier pregunta que tenga. Document Revised: 04/02/2017 Document Reviewed: 04/02/2017 Elsevier Patient Education  2021 [place], MD [facility]

## 2020-07-24 NOTE — Assessment & Plan Note (Signed)
Having intermittent episodes.  Will increase dose of omeprazole to 40 mg daily as requested by patient.

## 2020-08-09 ENCOUNTER — Ambulatory Visit: Payer: Self-pay | Admitting: Emergency Medicine

## 2020-10-22 ENCOUNTER — Ambulatory Visit: Payer: 59 | Admitting: Emergency Medicine

## 2020-10-30 ENCOUNTER — Encounter: Payer: Self-pay | Admitting: Emergency Medicine

## 2020-10-30 ENCOUNTER — Other Ambulatory Visit: Payer: Self-pay

## 2020-10-30 ENCOUNTER — Ambulatory Visit (INDEPENDENT_AMBULATORY_CARE_PROVIDER_SITE_OTHER): Payer: 59 | Admitting: Emergency Medicine

## 2020-10-30 VITALS — BP 132/76 | HR 74 | Temp 98.0°F | Ht 61.0 in | Wt 168.0 lb

## 2020-10-30 DIAGNOSIS — R0989 Other specified symptoms and signs involving the circulatory and respiratory systems: Secondary | ICD-10-CM | POA: Diagnosis not present

## 2020-10-30 DIAGNOSIS — Z8719 Personal history of other diseases of the digestive system: Secondary | ICD-10-CM

## 2020-10-30 DIAGNOSIS — J449 Chronic obstructive pulmonary disease, unspecified: Secondary | ICD-10-CM

## 2020-10-30 DIAGNOSIS — R059 Cough, unspecified: Secondary | ICD-10-CM | POA: Diagnosis not present

## 2020-10-30 DIAGNOSIS — J988 Other specified respiratory disorders: Secondary | ICD-10-CM | POA: Diagnosis not present

## 2020-10-30 DIAGNOSIS — B9789 Other viral agents as the cause of diseases classified elsewhere: Secondary | ICD-10-CM

## 2020-10-30 MED ORDER — BENZONATATE 200 MG PO CAPS: 200.0000 mg | ORAL_CAPSULE | Freq: Two times a day (BID) | ORAL | 0 refills | Status: DC | PRN

## 2020-10-30 MED ORDER — HYDROCODONE BIT-HOMATROP MBR 5-1.5 MG/5ML PO SOLN: 5.0000 mL | Freq: Every evening | ORAL | 0 refills | Status: DC | PRN

## 2020-10-30 MED ORDER — OMEPRAZOLE 40 MG PO CPDR: 40.0000 mg | DELAYED_RELEASE_CAPSULE | Freq: Every day | ORAL | 3 refills | Status: DC

## 2020-10-30 MED ORDER — PREDNISONE 20 MG PO TABS: 40.0000 mg | ORAL_TABLET | Freq: Every day | ORAL | 0 refills | Status: AC

## 2020-10-30 NOTE — Patient Instructions (Signed)
Tos en los adultos Cough, Adult La tos ayuda a despejar la garganta y los pulmones. La tos puede ser un signode una enfermedad u otra afeccin mdica. Una tos aguda puede durar [place] 2 o 3 semanas, mientras que una tos crnicapuede durar 8 semanas o ms [employer]. Hay muchas cosas que pueden causar tos. Estas incluyen lo siguiente: Grmenes (virus o bacterias) que atacan las vas respiratorias. Inhalacin de cosas que alteran (irritan) los pulmones. Alergias. Asma. Mucosidad que se desliza por la parte posterior de la garganta (goteo posnasal). Fumar. cido que vuelve desde el estmago hacia el tubo que transporta los alimentos desde la boca hasta el estmago (reflujo gastroesofgico). Algunos medicamentos. Problemas pulmonares. Otras enfermedades, como insuficiencia cardaca o un cogulo de sangre en el pulmn (embolia pulmonar). Siga estas instrucciones en su casa: Medicamentos Tome los medicamentos de venta libre y los recetados solamente como se lo haya indicado el mdico. Hable con el mdico antes de tomar medicamentos que detienen la tos (antitusivos). Estilo de vida  No fume y trate de no estar cerca de humo. No consuma ningn producto que contenga nicotina o tabaco, como cigarrillos, cigarrillos electrnicos y tabaco de Higher education careers adviser. Si necesita ayuda para dejar de fumar, consulte al mdico. Beba suficiente lquido para mantener el pis (la [place]) de color amarillo plido. Evite la cafena. No beba alcohol si el mdico se lo prohbe.  Instrucciones generales  Fjese si hay algn cambio en la tos. Informe a su mdico sobre ellos. Siempre cbrase la boca al toser. Aljese de las cosas que lo hagan toser, como perfumes, velas, humo de fogatas o productos de limpieza. Si el aire es [employer], use un humidificador o un vaporizador de niebla fra en su hogar. Si la tos empeora por la noche, pruebe con usar almohadas adicionales para elevar la cabeza mientras duerme. Descanse todo lo que sea  necesario. Concurra a todas las visitas de seguimiento como se lo haya indicado el mdico. Esto es importante.  Comunquese con un mdico si: Aparecen nuevos sntomas. Tose y escupe pus. La tos no mejora despus de 2 o 3 semanas o empeora. Los medicamentos para la tos no [employer] tos y usted no duerme bien. Siente un dolor que empeora o un dolor que no se alivia con medicamentos. Tiene fiebre. Est adelgazando y no sabe por qu. [place] durante la noche. Solicite ayuda inmediatamente si: Tose y [employer]. Tiene dificultad para respirar. El [employer] late [place] rpido. Estos sntomas pueden Sales executive. No espere a ver si los sntomas desaparecen. Solicite atencin mdica de inmediato. Comunquese con el servicio de emergencias de su localidad (911 en los [place]). No conduzca por sus propios medios [employer]. Resumen La tos ayuda a despejar la garganta y los pulmones. Hay muchas cosas que pueden causar tos. Tome los medicamentos de venta libre y los recetados solamente como se lo haya indicado el mdico. Siempre cbrase la boca al toser. Comunquese con un mdico si tiene sntomas nuevos o tiene una tos que no mejora o que [place]. Esta informacin no tiene Marine scientist el consejo del mdico. Asegresede hacerle al mdico cualquier pregunta que tenga. Document Revised: 04/14/2018 Document Reviewed: 04/14/2018 Elsevier Patient Education  2022 [employer].

## 2020-10-30 NOTE — Progress Notes (Signed)
Dana Daniels 75 y.o.   Chief Complaint  Patient presents with   Nasal Congestion    Pt has hx of asthma    HISTORY OF PRESENT ILLNESS: This is a 75 y.o. female with history of COPD complaining of 2-week history of chest congestion and cough. Having intermittent wheezing.  Denies fever or chills.  Phlegm still clear.  Cough worse at night.  Has OSA on CPAP. Has been using Tessalon and Robitussin-DM.  Using albuterol more frequently than usual.  On Breo Ellipta daily. No other complaints or medical concerns today.  HPI   Prior to Admission medications   Medication Sig Start Date End Date Taking? Authorizing Provider  albuterol (PROVENTIL) (2.5 MG/3ML) 0.083% nebulizer solution Take 3 mLs (2.5 mg total) by nebulization every 6 (six) hours as needed for wheezing or shortness of breath. 07/02/20  Yes Ishani Goldwasser, Ines Bloomer, MD  albuterol (VENTOLIN HFA) 108 (90 Base) MCG/ACT inhaler Inhale 2 puffs into the lungs every 4 (four) hours as needed for wheezing or shortness of breath. 07/24/20  Yes Joeziah Voit, Ines Bloomer, MD  fluticasone furoate-vilanterol (BREO ELLIPTA) 100-25 MCG/INH AEPB Inhale 1 puff into the lungs daily. 02/09/20  Yes Senai Ramnath, Ines Bloomer, MD  nitroGLYCERIN (NITROSTAT) 0.4 MG SL tablet Place 1 tablet (0.4 mg total) under the tongue every 5 (five) minutes as needed for chest pain. 02/09/20  Yes Meric Joye, Ines Bloomer, MD  omeprazole (PRILOSEC) 40 MG capsule Take 1 capsule (40 mg total) by mouth daily. 07/24/20  Yes Olivier Frayre, Ines Bloomer, MD  benzonatate (TESSALON) 200 MG capsule Take 1 capsule (200 mg total) by mouth 2 (two) times daily as needed for cough. Patient not taking: Reported on 10/30/2020 04/07/19   Horald Pollen, MD  cetirizine (ZYRTEC) 10 MG tablet Take 1 tablet (10 mg total) by mouth daily for 7 days. 03/31/18 04/07/18  Horald Pollen, MD  cromolyn (NASALCROM) 5.2 MG/ACT nasal spray Place 1 spray into both nostrils 2 (two) times daily. Patient not  taking: No sig reported 01/13/19   Horald Pollen, MD  diltiazem (CARDIZEM CD) 240 MG 24 hr capsule Take 1 capsule (240 mg total) by mouth daily. 05/18/20 08/16/20  Horald Pollen, MD  montelukast (SINGULAIR) 10 MG tablet Take 1 tablet (10 mg total) by mouth at bedtime. 05/18/20 08/16/20  Horald Pollen, MD  simvastatin (ZOCOR) 20 MG tablet Take 1 tablet (20 mg total) by mouth daily. 07/24/20 10/22/20  Horald Pollen, MD  valACYclovir (VALTREX) 1000 MG tablet Take 1 tablet (1,000 mg total) by mouth 2 (two) times daily. 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   History of gastroesophageal reflux (GERD) 09/25/2017   Osteoarthritis of multiple joints 03/24/2017   Chronic pain of left knee 03/24/2017   Chronic obstructive pulmonary disease ([place]) 03/24/2017   Primary cancer of skin of left foot 02/03/2017   Mild intermittent asthma without complication [id number]   Sleep apnea 02/03/2017   Dyslipidemia 02/03/2017   Migraine 02/03/2017   Essential hypertension 09/18/2008   Allergic rhinitis 09/18/2008    Past Medical History:  Diagnosis Date   Allergy    Arthritis    Asthma    Blood transfusion without reported diagnosis    Cataract    Hyperlipidemia  Hypertension    Migraine    Osteoporosis    Sleep apnea     Past Surgical History:  Procedure Laterality Date   ABDOMINAL HYSTERECTOMY     CHOLECYSTECTOMY     EYE SURGERY Left    cataract    FOOT SURGERY Left     Social History   Socioeconomic History   Marital status: Widowed    Spouse name: Not on file   Number of children: Not on file   Years of education: Not on file   Highest education level: Not on file  Occupational History   Not on file  Tobacco Use   Smoking status: Never    Smokeless tobacco: Never  Vaping Use   Vaping Use: Never used  Substance and Sexual Activity   Alcohol use: No   Drug use: Never   Sexual activity: Not on file  Other Topics Concern   Not on file  Social History Narrative   Not on file   Social Determinants of Health   Financial Resource Strain: Not on file  Food Insecurity: Not on file  Transportation Needs: Not on file  Physical Activity: Not on file  Stress: Not on file  Social Connections: Not on file  Intimate Partner Violence: Not on file    Family History  Problem Relation Age of Onset   Heart disease Mother    Hyperlipidemia Mother    Hypertension Mother    Stroke Mother    Heart disease Father    Stroke Father    Hypertension Son    Hyperlipidemia Son    Fibromyalgia Son    Lupus Son    Hypertension Son    Diabetes Daughter    Hypertension Daughter      Review of Systems  Constitutional: Negative.  Negative for chills and fever.  HENT: Negative.  Negative for congestion and sore throat.   Respiratory:  Positive for cough and wheezing. Negative for hemoptysis, sputum production and shortness of breath.   Cardiovascular:  Negative for chest pain and palpitations.  Gastrointestinal:  Negative for abdominal pain, diarrhea, nausea and vomiting.  Genitourinary: Negative.  Negative for dysuria and hematuria.  Skin: Negative.  Negative for rash.  Neurological: Negative.  Negative for dizziness and headaches.  All other systems reviewed and are negative.   Physical Exam Vitals reviewed.  Constitutional:      Appearance: Normal appearance.  HENT:     Head: Normocephalic.  Eyes:     Extraocular Movements: Extraocular movements intact.     Pupils: Pupils are equal, round, and reactive to light.  Cardiovascular:     Rate and Rhythm: Normal rate and regular rhythm.     Pulses: Normal pulses.     Heart sounds: Normal heart sounds.  Pulmonary:     Effort: Pulmonary effort is normal.     Breath sounds:  Normal breath sounds.  Musculoskeletal:     Cervical back: Normal range of motion and neck supple. No tenderness.  Lymphadenopathy:     Cervical: No cervical adenopathy.  Skin:    General: Skin is warm and dry.     Capillary Refill: Capillary refill takes less than 2 seconds.  Neurological:     General: No focal deficit present.     Mental Status: She is alert and oriented to person, place, and time.  Psychiatric:        Mood and Affect: Mood normal.        Behavior: Behavior normal.  ASSESSMENT & PLAN: Aaylah was seen today for nasal congestion.  Diagnoses and all orders for this visit:  Cough -     predniSONE (DELTASONE) 20 MG tablet; Take 2 tablets (40 mg total) by mouth daily with breakfast for 5 days. -     benzonatate (TESSALON) 200 MG capsule; Take 1 capsule (200 mg total) by mouth 2 (two) times daily as needed for cough. -     HYDROcodone bit-homatropine (HYCODAN) 5-1.5 MG/5ML syrup; Take 5 mLs by mouth at bedtime as needed for cough.  Chronic obstructive pulmonary disease, unspecified COPD type (HCC)  Viral respiratory infection  Chest congestion  History of gastroesophageal reflux (GERD) -     omeprazole (PRILOSEC) 40 MG capsule; Take 1 capsule (40 mg total) by mouth daily.  Clinically stable.  No red flag signs or symptoms.  Mild exacerbation of COPD secondary to viral respiratory infection. Take medications as prescribed.  Advised to contact the office if no better or worse during the next several days.  Patient Instructions  Tos en los adultos Cough, Adult La tos ayuda a despejar la garganta y los pulmones. La tos puede ser un signode una enfermedad u otra afeccin mdica. Una tos aguda puede durar [place] 2 o 3 semanas, mientras que una tos crnicapuede durar 8 semanas o ms [employer]. Hay muchas cosas que pueden causar tos. Estas incluyen lo siguiente: Grmenes (virus o bacterias) que atacan las vas respiratorias. Inhalacin de cosas que alteran (irritan)  los pulmones. Alergias. Asma. Mucosidad que se desliza por la parte posterior de la garganta (goteo posnasal). Fumar. cido que vuelve desde el estmago hacia el tubo que transporta los alimentos desde la boca hasta el estmago (reflujo gastroesofgico). Algunos medicamentos. Problemas pulmonares. Otras enfermedades, como insuficiencia cardaca o un cogulo de sangre en el pulmn (embolia pulmonar). Siga estas instrucciones en su casa: Medicamentos Tome los medicamentos de venta libre y los recetados solamente como se lo haya indicado el mdico. Hable con el mdico antes de tomar medicamentos que detienen la tos (antitusivos). Estilo de vida  No fume y trate de no estar cerca de humo. No consuma ningn producto que contenga nicotina o tabaco, como cigarrillos, cigarrillos electrnicos y tabaco de Higher education careers adviser. Si necesita ayuda para dejar de fumar, consulte al mdico. Beba suficiente lquido para mantener el pis (la [place]) de color amarillo plido. Evite la cafena. No beba alcohol si el mdico se lo prohbe.  Instrucciones generales  Fjese si hay algn cambio en la tos. Informe a su mdico sobre ellos. Siempre cbrase la boca al toser. Aljese de las cosas que lo hagan toser, como perfumes, velas, humo de fogatas o productos de limpieza. Si el aire es [employer], use un humidificador o un vaporizador de niebla fra en su hogar. Si la tos empeora por la noche, pruebe con usar almohadas adicionales para elevar la cabeza mientras duerme. Descanse todo lo que sea necesario. Concurra a todas las visitas de seguimiento como se lo haya indicado el mdico. Esto es importante.  Comunquese con un mdico si: Aparecen nuevos sntomas. Tose y escupe pus. La tos no mejora despus de 2 o 3 semanas o empeora. Los medicamentos para la tos no [employer] tos y usted no duerme bien. Siente un dolor que empeora o un dolor que no se alivia con medicamentos. Tiene fiebre. Est adelgazando y no sabe por  qu. [place] durante la noche. Solicite ayuda inmediatamente si: Tose y [employer]. Tiene dificultad para respirar. [place] [employer] late [place]  rpido. Estos sntomas pueden indicar una emergencia. No espere a ver si los sntomas desaparecen. Solicite atencin mdica de inmediato. Comunquese con el servicio de emergencias de su localidad (911 en los [place]). No conduzca por sus propios medios [employer]. Resumen La tos ayuda a despejar la garganta y los pulmones. Hay muchas cosas que pueden causar tos. Tome los medicamentos de venta libre y los recetados solamente como se lo haya indicado el mdico. Siempre cbrase la boca al toser. Comunquese con un mdico si tiene sntomas nuevos o tiene una tos que no mejora o que [place]. Esta informacin no tiene Marine scientist el consejo del mdico. Asegresede hacerle al mdico cualquier pregunta que tenga. Document Revised: 04/14/2018 Document Reviewed: 04/14/2018 Elsevier Patient Education  2022 [place], MD [facility]

## 2020-11-02 ENCOUNTER — Encounter: Payer: Self-pay | Admitting: Emergency Medicine

## 2020-11-06 ENCOUNTER — Encounter: Payer: Self-pay | Admitting: Emergency Medicine

## 2020-12-27 ENCOUNTER — Other Ambulatory Visit: Payer: Self-pay

## 2020-12-27 ENCOUNTER — Ambulatory Visit (INDEPENDENT_AMBULATORY_CARE_PROVIDER_SITE_OTHER): Payer: 59

## 2020-12-27 ENCOUNTER — Ambulatory Visit (INDEPENDENT_AMBULATORY_CARE_PROVIDER_SITE_OTHER): Payer: 59 | Admitting: Emergency Medicine

## 2020-12-27 ENCOUNTER — Encounter: Payer: Self-pay | Admitting: Emergency Medicine

## 2020-12-27 VITALS — BP 138/78 | HR 90 | Temp 98.5°F | Ht 61.0 in | Wt 167.0 lb

## 2020-12-27 DIAGNOSIS — J449 Chronic obstructive pulmonary disease, unspecified: Secondary | ICD-10-CM

## 2020-12-27 DIAGNOSIS — Z23 Encounter for immunization: Secondary | ICD-10-CM | POA: Diagnosis not present

## 2020-12-27 MED ORDER — TRELEGY ELLIPTA 100-62.5-25 MCG/ACT IN AEPB: 1.0000 | INHALATION_SPRAY | Freq: Every day | RESPIRATORY_TRACT | 11 refills | Status: DC

## 2020-12-27 NOTE — Assessment & Plan Note (Addendum)
Not well controlled and using rescue inhaler more often than she should. Chronic cough.  No flulike symptoms.  Afebrile.  Stable vital signs. Breo Ellipta not working well.  Will change to Trelegy. Tachycardia is related to overuse of albuterol. Chest x-ray done today.

## 2020-12-27 NOTE — Progress Notes (Signed)
Dana Daniels 75 y.o.   Chief Complaint  Patient presents with   Follow-up    Chronic conditions    HISTORY OF PRESENT ILLNESS: This is a 75 y.o. female complaining of occasional tachycardias. Patient has history of COPD and has been using albuterol inhaler and nebulizer more often. Has chronic dry cough. No other complaints or medical concerns today.  HPI   Prior to Admission medications   Medication Sig Start Date End Date Taking? Authorizing Provider  albuterol (PROVENTIL) (2.5 MG/3ML) 0.083% nebulizer solution Take 3 mLs (2.5 mg total) by nebulization every 6 (six) hours as needed for wheezing or shortness of breath. 07/02/20  Yes Feather Berrie, Ines Bloomer, MD  albuterol (VENTOLIN HFA) 108 (90 Base) MCG/ACT inhaler Inhale 2 puffs into the lungs every 4 (four) hours as needed for wheezing or shortness of breath. 07/24/20  Yes Pinkey Mcjunkin, Ines Bloomer, MD  benzonatate (TESSALON) 200 MG capsule Take 1 capsule (200 mg total) by mouth 2 (two) times daily as needed for cough. 10/30/20  Yes Alphonzo Devera, Ines Bloomer, MD  cromolyn (NASALCROM) 5.2 MG/ACT nasal spray Place 1 spray into both nostrils 2 (two) times daily. 01/13/19  Yes Irem Stoneham, Ines Bloomer, MD  fluticasone furoate-vilanterol (BREO ELLIPTA) 100-25 MCG/INH AEPB Inhale 1 puff into the lungs daily. 02/09/20  Yes Gwynne Kemnitz, Ines Bloomer, MD  HYDROcodone bit-homatropine [facility]) 5-1.5 MG/5ML syrup Take 5 mLs by mouth at bedtime as needed for cough. 10/30/20  Yes Holland Kotter, Ines Bloomer, MD  nitroGLYCERIN (NITROSTAT) 0.4 MG SL tablet Place 1 tablet (0.4 mg total) under the tongue every 5 (five) minutes as needed for chest pain. 02/09/20  Yes Dragan Tamburrino, Ines Bloomer, MD  omeprazole (PRILOSEC) 40 MG capsule Take 1 capsule (40 mg total) by mouth daily. 10/30/20  Yes Toshiro Hanken, Ines Bloomer, MD  valACYclovir (VALTREX) 1000 MG tablet Take 1 tablet (1,000 mg total) by mouth 2 (two) times daily. 09/25/17  Yes Trevante Tennell, Ines Bloomer, MD  cetirizine (ZYRTEC)  10 MG tablet Take 1 tablet (10 mg total) by mouth daily for 7 days. 03/31/18 04/07/18  Horald Pollen, MD  diltiazem (CARDIZEM CD) 240 MG 24 hr capsule Take 1 capsule (240 mg total) by mouth daily. 05/18/20 08/16/20  Horald Pollen, MD  montelukast (SINGULAIR) 10 MG tablet Take 1 tablet (10 mg total) by mouth at bedtime. 05/18/20 08/16/20  Horald Pollen, MD  simvastatin (ZOCOR) 20 MG tablet Take 1 tablet (20 mg total) by mouth daily. 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   History of gastroesophageal reflux (GERD) 09/25/2017   Osteoarthritis of multiple joints 03/24/2017   Chronic pain of left knee 03/24/2017   Chronic obstructive pulmonary disease ([place]) 03/24/2017   Primary cancer of skin of left foot 02/03/2017   Mild intermittent asthma without complication 32/44/0102   Sleep apnea 02/03/2017   Dyslipidemia 02/03/2017   Migraine 02/03/2017   Essential hypertension 09/18/2008   Allergic rhinitis 09/18/2008    Past Medical History:  Diagnosis Date   Allergy    Arthritis    Asthma    Blood transfusion without reported diagnosis    Cataract    Hyperlipidemia    Hypertension    Migraine    Osteoporosis    Sleep apnea     Past Surgical History:  Procedure Laterality Date  ABDOMINAL HYSTERECTOMY     CHOLECYSTECTOMY     EYE SURGERY Left    cataract    FOOT SURGERY Left     Social History   Socioeconomic History   Marital status: Widowed    Spouse name: Not on file   Number of children: Not on file   Years of education: Not on file   Highest education level: Not on file  Occupational History   Not on file  Tobacco Use   Smoking status: Never   Smokeless tobacco: Never  Vaping Use   Vaping Use: Never used  Substance and Sexual Activity   Alcohol use: No    Drug use: Never   Sexual activity: Not on file  Other Topics Concern   Not on file  Social History Narrative   Not on file   Social Determinants of Health   Financial Resource Strain: Not on file  Food Insecurity: Not on file  Transportation Needs: Not on file  Physical Activity: Not on file  Stress: Not on file  Social Connections: Not on file  Intimate Partner Violence: Not on file    Family History  Problem Relation Age of Onset   Heart disease Mother    Hyperlipidemia Mother    Hypertension Mother    Stroke Mother    Heart disease Father    Stroke Father    Hypertension Son    Hyperlipidemia Son    Fibromyalgia Son    Lupus Son    Hypertension Son    Diabetes Daughter    Hypertension Daughter      Review of Systems  Constitutional: Negative.  Negative for chills and fever.  HENT: Negative.  Negative for congestion and sore throat.   Respiratory:  Positive for cough and shortness of breath. Negative for hemoptysis and sputum production.   Cardiovascular:  Positive for palpitations. Negative for chest pain.  Gastrointestinal:  Negative for abdominal pain, diarrhea, nausea and vomiting.  Genitourinary: Negative.   Musculoskeletal:  Positive for back pain and joint pain.  Skin: Negative.  Negative for rash.  Neurological:  Negative for dizziness, loss of consciousness and headaches.  All other systems reviewed and are negative.  Today's Vitals   12/27/20 1037  BP: (!) 160/92  Pulse: 90  Temp: 98.5 F (36.9 C)  TempSrc: Oral  SpO2: 96%  Weight: 167 lb (75.8 kg)  Height: 5\' 1"  (1.549 m)   Body mass index is 31.55 kg/m. Wt Readings from Last 3 Encounters:  12/27/20 167 lb (75.8 kg)  10/30/20 168 lb (76.2 kg)  07/24/20 169 lb (76.7 kg)     Physical Exam Vitals reviewed.  Constitutional:      Appearance: Normal appearance.  HENT:     Head: Normocephalic.     Mouth/Throat:     Mouth: Mucous membranes are moist.     Pharynx: Oropharynx is clear.   Eyes:     Extraocular Movements: Extraocular movements intact.     Conjunctiva/sclera: Conjunctivae normal.     Pupils: Pupils are equal, round, and reactive to light.  Cardiovascular:     Rate and Rhythm: Normal rate and regular rhythm.     Pulses: Normal pulses.     Heart sounds: Normal heart sounds.  Pulmonary:     Effort: Pulmonary effort is normal.     Breath sounds: Normal breath sounds.  Musculoskeletal:     Cervical back: Normal range of motion and neck supple.     Right lower leg: No edema.  Left lower leg: No edema.  Skin:    General: Skin is warm and dry.     Capillary Refill: Capillary refill takes less than 2 seconds.  Neurological:     General: No focal deficit present.     Mental Status: She is alert and oriented to person, place, and time.  Psychiatric:        Mood and Affect: Mood normal.        Behavior: Behavior normal.    DG Chest 2 View  Result Date: 12/27/2020 CLINICAL DATA:  COPD, cough EXAM: CHEST - 2 VIEW COMPARISON:  03/29/2016 FINDINGS: The heart size and mediastinal contours are within normal limits. Both lungs are clear. Disc degenerative disease of the thoracic spine. IMPRESSION: No acute abnormality of the lungs. Electronically Signed   By: Delanna Ahmadi M.D.   On: 12/27/2020 11:53    ASSESSMENT & PLAN: Problem List Items Addressed This Visit       Respiratory   Chronic obstructive pulmonary disease ([place]) - Primary    Not well controlled and using rescue inhaler more often than she should. Chronic cough.  No flulike symptoms.  Afebrile.  Stable vital signs. Breo Ellipta not working well.  Will change to Trelegy. Tachycardia is related to overuse of albuterol. Chest x-ray done today.      Relevant Medications   Fluticasone-Umeclidin-Vilant (TRELEGY ELLIPTA) 100-62.5-25 MCG/ACT AEPB   Other Relevant Orders   Pneumococcal conjugate vaccine 20-valent (Prevnar 20) (Completed)   DG Chest 2 View (Completed)   Patient Instructions  Get  chest x-ray done before you leave the office. Stop Breo Ellipta and start Trelegy. Follow-up in 3 months Enfermedad pulmonar obstructiva crnica Chronic Obstructive Pulmonary Disease La enfermedad pulmonar obstructiva crnica (EPOC) es un problema pulmonar de larga duracin (crnico). La EPOC dificulta la entrada y salida de aire de los pulmones. Por lo general, la afeccin empeora con [employer], y los pulmones nunca volvern a la normalidad. Puede hacer algunas cosas para mantenerse lo ms sano posible. Cules son las causas? Fumar. Esta es la causa ms frecuente. Ciertos genes que se transmiten de padres a hijos (hereditarios). Qu incrementa el riesgo? Estar expuesto a humo ambiental de cigarrillos, pipas o cigarros. Estar expuesto a sustancias qumicas y otras sustancias irritantes, como gases y polvo en el ambiente laboral. Raynelle Jan afecciones o infecciones pulmonares crnicas. Cules son los signos o sntomas? Falta de aire, especialmente al realizar actividad fsica. Tos a largo plazo con una gran cantidad de mucosidad espesa. A veces, es posible que la tos no contenga mucosidad (tos seca). Sibilancias. Respirar rpidamente. Piel con un aspecto gris o azul, especialmente en [place] y los pies, o los labios. Sensacin de cansancio (fatiga). Prdida de peso. Opresin en el pecho. Tener infecciones con frecuencia. Episodios en los que los sntomas respiratorios empeoran mucho (exacerbaciones). En las ltimas etapas de esta enfermedad, puede tener hinchazn de los tobillos, los pies o las piernas. Cmo se trata? Usar medicamentos. Si fuma, dejar de hacerlo. Rehabilitacin. Esto incluye medidas para que el cuerpo funcione mejor. Puede requerir la participacin de un equipo de especialistas. Hacer ejercicios. Realizar cambios en la dieta. Usar oxgeno. Ciruga pulmonar. Trasplante de pulmn. Medidas para el bienestar (cuidados paliativos). Siga estas instrucciones  en su casa: Medicamentos Use los medicamentos de venta libre y los recetados solamente como se lo haya indicado el mdico. Hable con el mdico antes de tomar cualquier medicamento para la tos o las [place]. Es posible que Product/process development scientist  los medicamentos que hagan que los pulmones se sequen. Estilo de vida Si fuma, deje de fumar. Fumar empeora el problema. No fume ni consuma ningn producto que contenga nicotina o tabaco. Si necesita ayuda para dejar de fumar, consulte al mdico. Evite rodearse de factores que empeoran su respiracin. Por ejemplo, humo, sustancias qumicas y vapores. Mantngase activo, pero recuerde que tambin Administrator. Aprenda y utilice tcnicas para controlar el estrs y la respiracin. Asegrese de dormir lo suficiente. La mayora de los adultos necesitan dormir 7 horas como mnimo todas las noches. Consuma alimentos saludables. Consuma pequeas cantidades de alimentos con mayor frecuencia. Descanse antes de las comidas. Respiracin controlada Aprenda y utilice consejos sobre cmo controlar su respiracin como se lo haya indicado el mdico. Intente lo siguiente: Inspire (inhale) por la nariz durante 1 segundo. Luego frunza los labios y espire (exhale) a travs de los labios durante 2 segundos. Coloque una mano sobre el vientre (abdomen). Inhale lentamente por la nariz durante 1 segundo. La mano que est sobre el vientre debe moverse. Frunza los labios y espire lentamente por los labios. La mano que est sobre su vientre debe moverse cuando espire.  Tos controlada Aprenda a utilizar la tos controlada para despejar la mucosidad de los pulmones. Siga estos pasos: Incline la cabeza un poco hacia adelante. Inspire profundamente. Trate de [employer] aire por 3 segundos. Mantenga la boca ligeramente abierta mientras tose 2 veces. Escupa la mucosidad en un pauelo. Descanse y repita los pasos 1 o 2 veces, segn lo necesite. Instrucciones generales Asegrese de recibir todas  las dosis (vacunas) que el [employer] recomiende. Consulte al [employer] vacunas para la gripe y la neumona. Use la oxigenoterapia y la rehabilitacin pulmonar si as se lo indica el mdico. Si necesita oxigenoterapia en su casa, pregntele al mdico si debe comprar una herramienta para medir su nivel de oxgeno (oxmetro). Realice un plan de accin para la EPOC junto a su mdico. Esto lo ayudar a saber qu debe hacer si se siente peor que de costumbre. Mantenga bajo control cualquier otra afeccin que padezca como se lo haya indicado el mdico. Evite salir cuando hace mucho calor, fro o humedad. Evite a las personas que tengan una enfermedad contagiosa. Concurra a [place]. Comunquese con un mdico si: Tose y elimina ms mucosidad que lo habitual. Hay un cambio en el color o en la consistencia de la mucosidad. Le cuesta respirar ms de lo habitual. La respiracin es ms rpida de lo habitual. Tiene dificultad para dormir. Debe usar sus medicamentos con ms frecuencia que lo habitual. Tiene dificultad para [employer] cotidianas, como vestirse o caminar por la casa. Solicite ayuda de inmediato si: Tiene dificultad para Chemical engineer. Tiene dificultad para respirar y Theme park manager lo siguiente: Clinical research associate. Hacer actividades normales. Le duele el pecho durante ms de 5 minutos. Tiene la piel ms [employer] lo normal. El pulsioxmetro muestra que tiene un nivel bajo de oxgeno durante ms de 5 minutos. Tiene fiebre. Se siente demasiado cansado para respirar con normalidad. Estos sntomas pueden representar un problema grave que constituye Engineer, maintenance (IT). No espere a ver si los sntomas desaparecen. Solicite atencin mdica de inmediato. Comunquese con el servicio de emergencias de su localidad (911 en los [place]). No conduzca por sus propios medios [employer] hospital. Resumen La enfermedad pulmonar obstructiva  crnica (EPOC) es un problema pulmonar de larga duracin. El funcionamiento de los pulmones nunca volver a la  normalidad. Por lo general, la enfermedad empeora con el tiempo. Puede hacer algunas cosas para mantenerse lo ms sano posible. Use los medicamentos de venta libre y los recetados solamente como se lo haya indicado el mdico. Si fuma, deje de [place]. Fumar empeora el problema. Esta informacin no tiene Marine scientist el consejo del mdico. Asegrese de hacerle al mdico cualquier pregunta que tenga. Document Revised: 01/24/2020 Document Reviewed: 01/17/2020 Elsevier Patient Education  2022 [place], MD [facility]

## 2020-12-27 NOTE — Patient Instructions (Signed)
Get chest x-ray done before you leave the office. Stop Breo Ellipta and start Trelegy. Follow-up in 3 months Enfermedad pulmonar obstructiva crnica Chronic Obstructive Pulmonary Disease La enfermedad pulmonar obstructiva crnica (EPOC) es un problema pulmonar de larga duracin (crnico). La EPOC dificulta la entrada y salida de aire de los pulmones. Por lo general, la afeccin empeora con [employer], y los pulmones nunca volvern a la normalidad. Puede hacer algunas cosas para mantenerse lo ms sano posible. Cules son las causas? Fumar. Esta es la causa ms frecuente. Ciertos genes que se transmiten de padres a hijos (hereditarios). Qu incrementa el riesgo? Estar expuesto a humo ambiental de cigarrillos, pipas o cigarros. Estar expuesto a sustancias qumicas y otras sustancias irritantes, como gases y polvo en el ambiente laboral. Raynelle Jan afecciones o infecciones pulmonares crnicas. Cules son los signos o sntomas? Falta de aire, especialmente al realizar actividad fsica. Tos a largo plazo con una gran cantidad de mucosidad espesa. A veces, es posible que la tos no contenga mucosidad (tos seca). Sibilancias. Respirar rpidamente. Piel con un aspecto gris o azul, especialmente en [place] y los pies, o los labios. Sensacin de cansancio (fatiga). Prdida de peso. Opresin en el pecho. Tener infecciones con frecuencia. Episodios en los que los sntomas respiratorios empeoran mucho (exacerbaciones). En las ltimas etapas de esta enfermedad, puede tener hinchazn de los tobillos, los pies o las piernas. Cmo se trata? Usar medicamentos. Si fuma, dejar de hacerlo. Rehabilitacin. Esto incluye medidas para que el cuerpo funcione mejor. Puede requerir la participacin de un equipo de especialistas. Hacer ejercicios. Realizar cambios en la dieta. Usar oxgeno. Ciruga pulmonar. Trasplante de pulmn. Medidas para el bienestar (cuidados paliativos). Siga estas  instrucciones en su casa: Medicamentos Use los medicamentos de venta libre y los recetados solamente como se lo haya indicado el mdico. Hable con el mdico antes de tomar cualquier medicamento para la tos o las [place]. Es posible que Product/process development scientist los medicamentos que hagan que los pulmones se sequen. Estilo de vida Si fuma, deje de fumar. Fumar empeora el problema. No fume ni consuma ningn producto que contenga nicotina o tabaco. Si necesita ayuda para dejar de fumar, consulte al mdico. Evite rodearse de factores que empeoran su respiracin. Por ejemplo, humo, sustancias qumicas y vapores. Mantngase activo, pero recuerde que tambin Administrator. Aprenda y utilice tcnicas para controlar el estrs y la respiracin. Asegrese de dormir lo suficiente. La mayora de los adultos necesitan dormir 7 horas como mnimo todas las noches. Consuma alimentos saludables. Consuma pequeas cantidades de alimentos con mayor frecuencia. Descanse antes de las comidas. Respiracin controlada Aprenda y utilice consejos sobre cmo controlar su respiracin como se lo haya indicado el mdico. Intente lo siguiente: Inspire (inhale) por la nariz durante 1 segundo. Luego frunza los labios y espire (exhale) a travs de los labios durante 2 segundos. Coloque una mano sobre el vientre (abdomen). Inhale lentamente por la nariz durante 1 segundo. La mano que est sobre el vientre debe moverse. Frunza los labios y espire lentamente por los labios. La mano que est sobre su vientre debe moverse cuando espire.  Tos controlada Aprenda a utilizar la tos controlada para despejar la mucosidad de los pulmones. Siga estos pasos: Incline la cabeza un poco hacia adelante. Inspire profundamente. Trate de [employer] aire por 3 segundos. Mantenga la boca ligeramente abierta mientras tose 2 veces. Escupa la mucosidad en un pauelo. Descanse y repita los pasos 1 o 2 veces, segn lo necesite. Instrucciones generales Asegrese  de  recibir todas las dosis (vacunas) que el [employer] recomiende. Consulte al [employer] vacunas para la gripe y la neumona. Use la oxigenoterapia y la rehabilitacin pulmonar si as se lo indica el mdico. Si necesita oxigenoterapia en su casa, pregntele al mdico si debe comprar una herramienta para medir su nivel de oxgeno (oxmetro). Realice un plan de accin para la EPOC junto a su mdico. Esto lo ayudar a saber qu debe hacer si se siente peor que de costumbre. Mantenga bajo control cualquier otra afeccin que padezca como se lo haya indicado el mdico. Evite salir cuando hace mucho calor, fro o humedad. Evite a las personas que tengan una enfermedad contagiosa. Concurra a [place]. Comunquese con un mdico si: Tose y elimina ms mucosidad que lo habitual. Hay un cambio en el color o en la consistencia de la mucosidad. Le cuesta respirar ms de lo habitual. La respiracin es ms rpida de lo habitual. Tiene dificultad para dormir. Debe usar sus medicamentos con ms frecuencia que lo habitual. Tiene dificultad para [employer] cotidianas, como vestirse o caminar por la casa. Solicite ayuda de inmediato si: Tiene dificultad para Chemical engineer. Tiene dificultad para respirar y Theme park manager lo siguiente: Clinical research associate. Hacer actividades normales. Le duele el pecho durante ms de 5 minutos. Tiene la piel ms [employer] lo normal. El pulsioxmetro muestra que tiene un nivel bajo de oxgeno durante ms de 5 minutos. Tiene fiebre. Se siente demasiado cansado para respirar con normalidad. Estos sntomas pueden representar un problema grave que constituye Engineer, maintenance (IT). No espere a ver si los sntomas desaparecen. Solicite atencin mdica de inmediato. Comunquese con el servicio de emergencias de su localidad (911 en los [place]). No conduzca por sus propios medios [employer] hospital. Resumen La enfermedad pulmonar  obstructiva crnica (EPOC) es un problema pulmonar de larga duracin. El funcionamiento de los pulmones nunca volver a la normalidad. Por lo general, la enfermedad empeora con el tiempo. Puede hacer algunas cosas para mantenerse lo ms sano posible. Use los medicamentos de venta libre y los recetados solamente como se lo haya indicado el mdico. Si fuma, deje de [place]. Fumar empeora el problema. Esta informacin no tiene Marine scientist el consejo del mdico. Asegrese de hacerle al mdico cualquier pregunta que tenga. Document Revised: 01/24/2020 Document Reviewed: 01/17/2020 Elsevier Patient Education  2022 [employer].

## 2021-01-09 IMAGING — MG DIGITAL SCREENING BILAT W/ TOMO W/ CAD
8 series · 9 of 24 positions shown · non-contrast
Comparison: None.

CLINICAL DATA: Screening.

EXAM:
DIGITAL SCREENING BILATERAL MAMMOGRAM WITH TOMO AND CAD

[L MLO synth-2D]
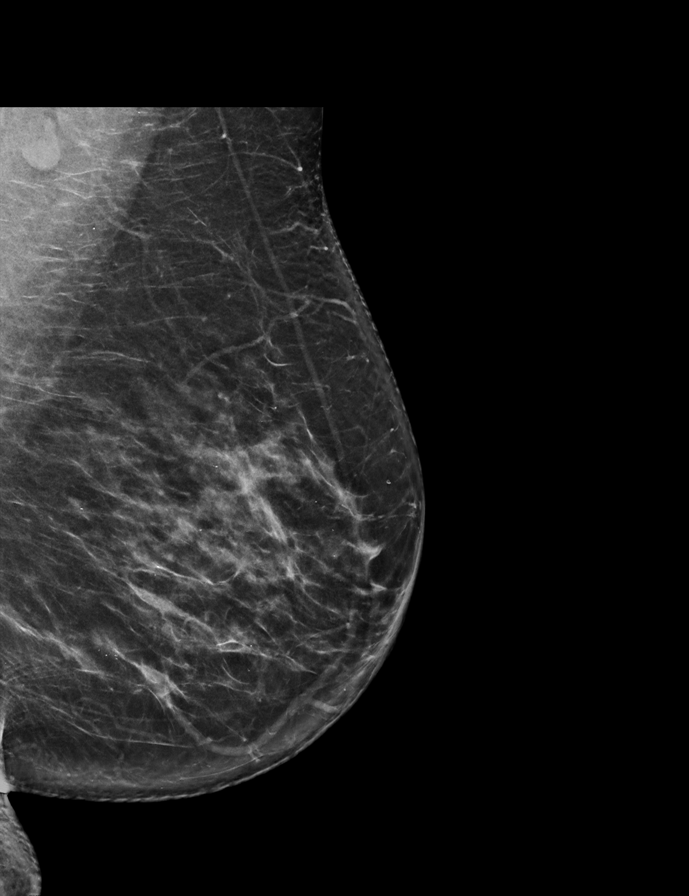

[L CC synth-2D]
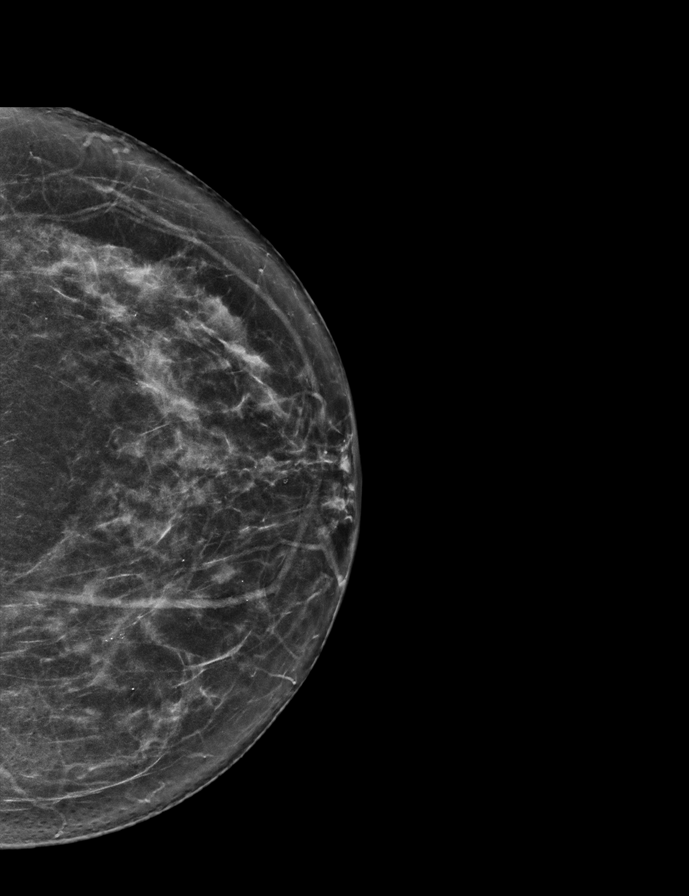

[R CC synth-2D]
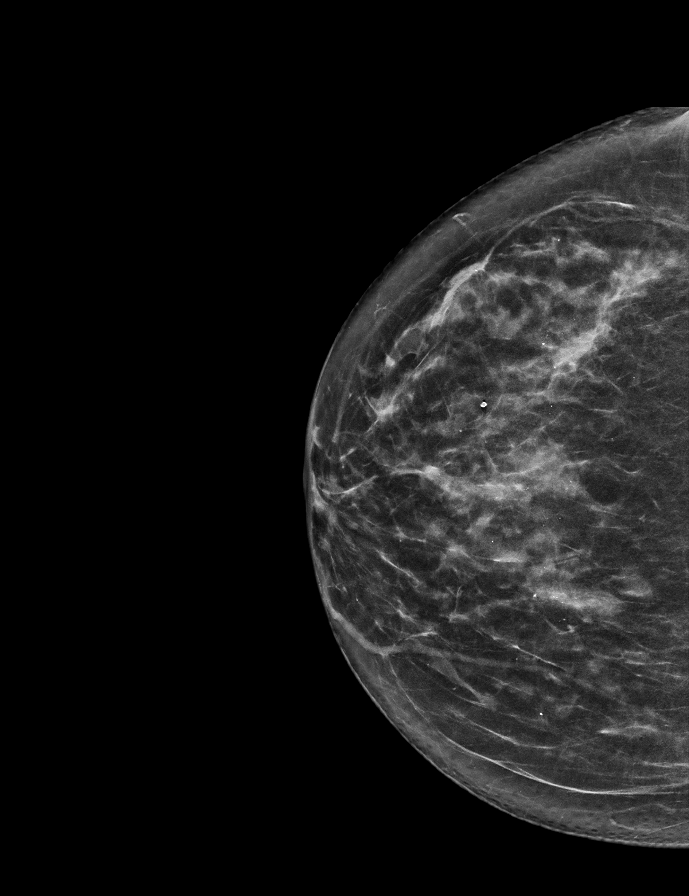

[R MLO synth-2D]
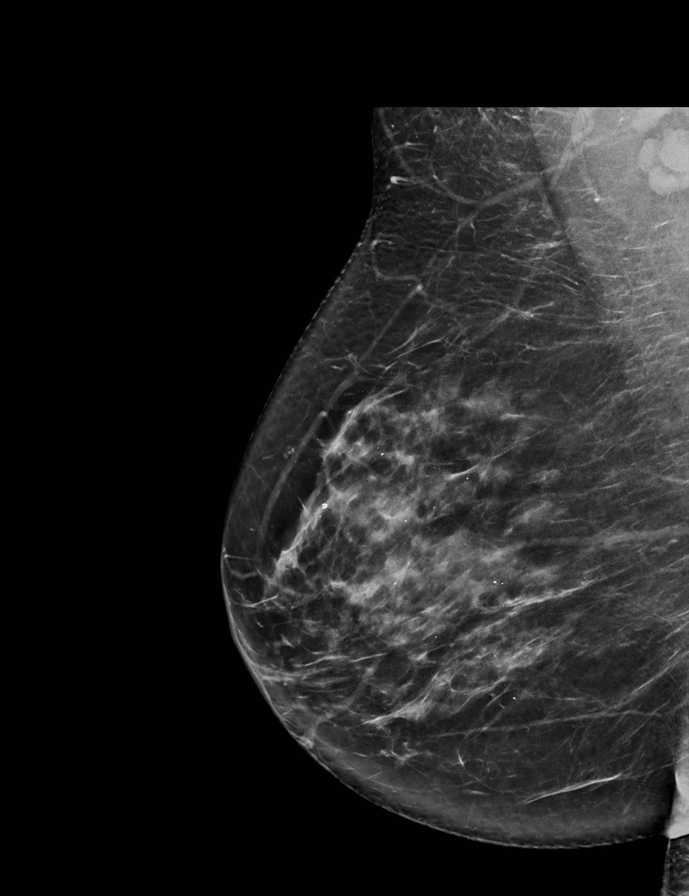

[L MLO tomo · 2 of 74 frames shown]
[frame 24/74]
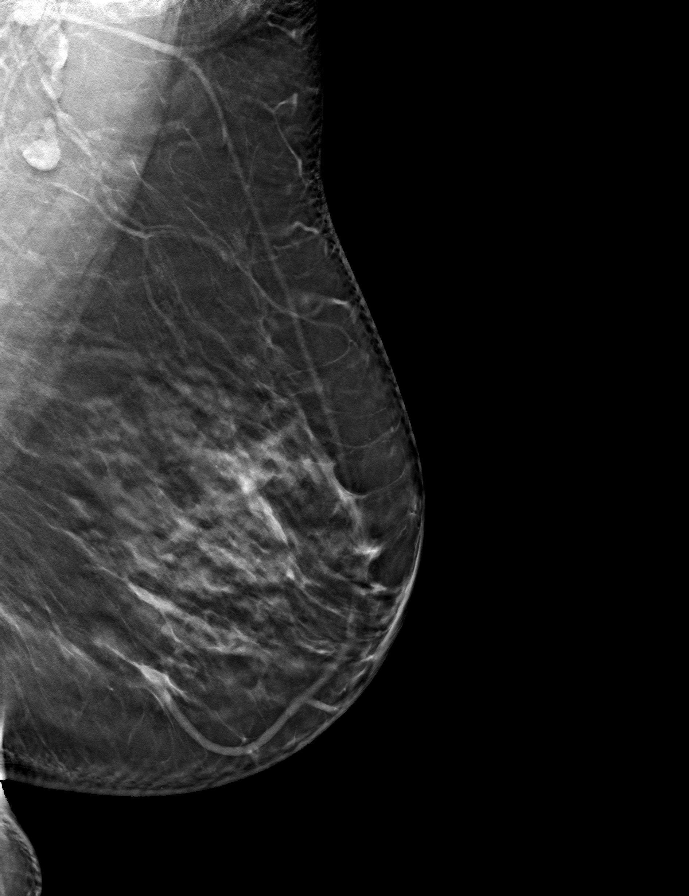
[frame 37/74]
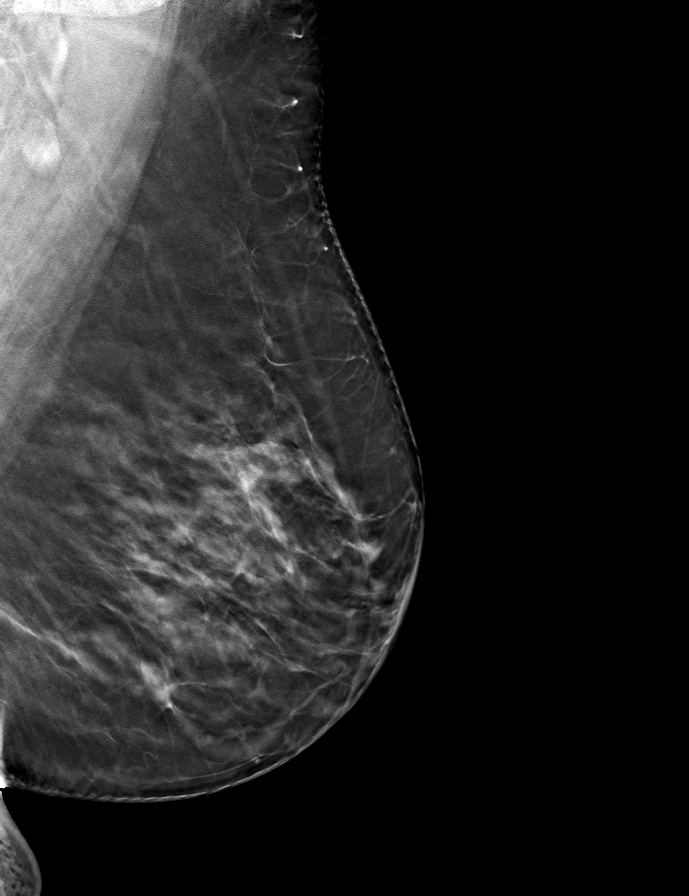

[R CC tomo · tomo slice 31/61.0]
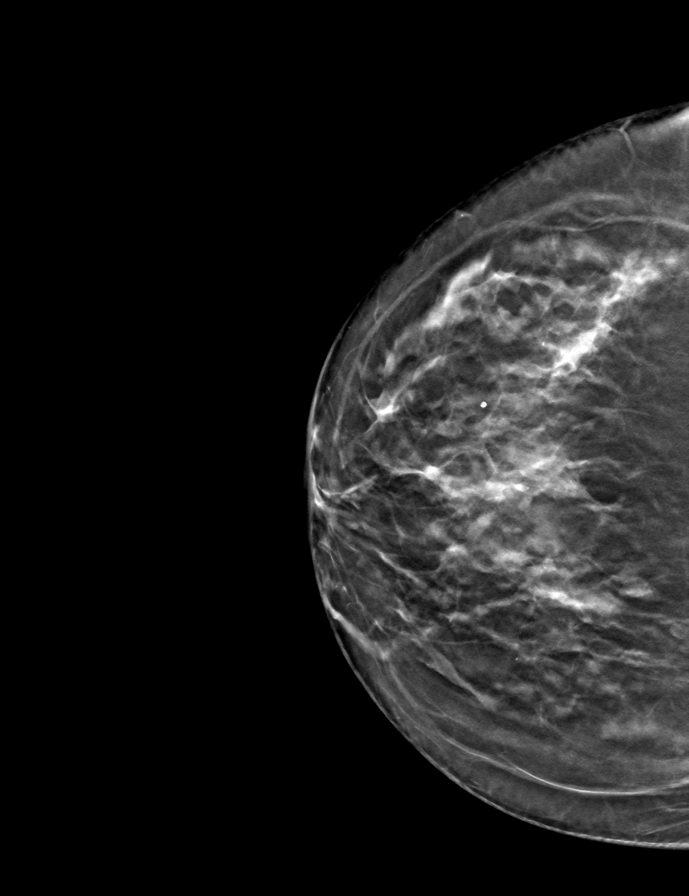

[R MLO tomo · tomo slice 39/77.0]
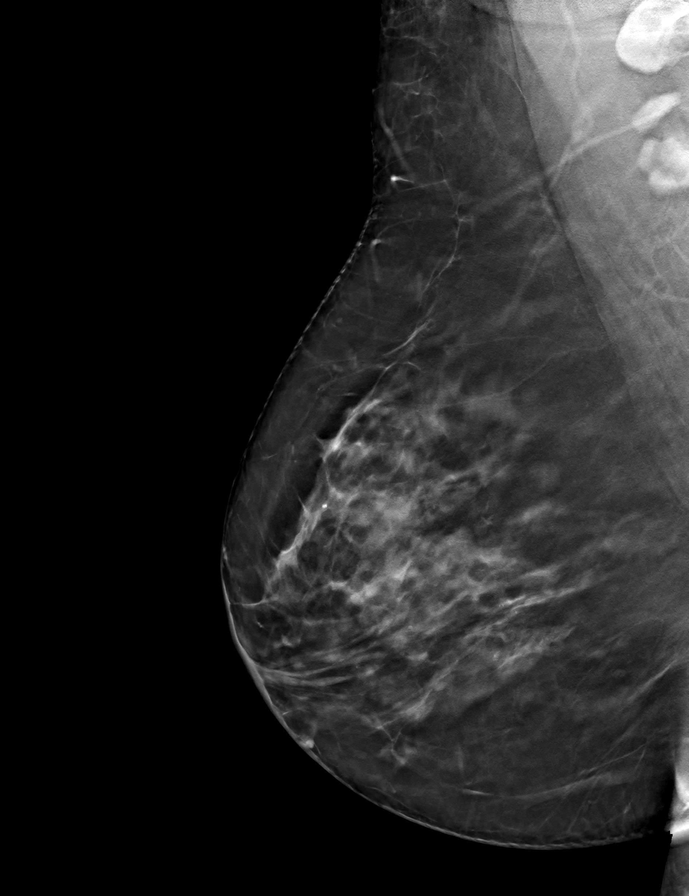

[L CC tomo · tomo slice 31/62.0]
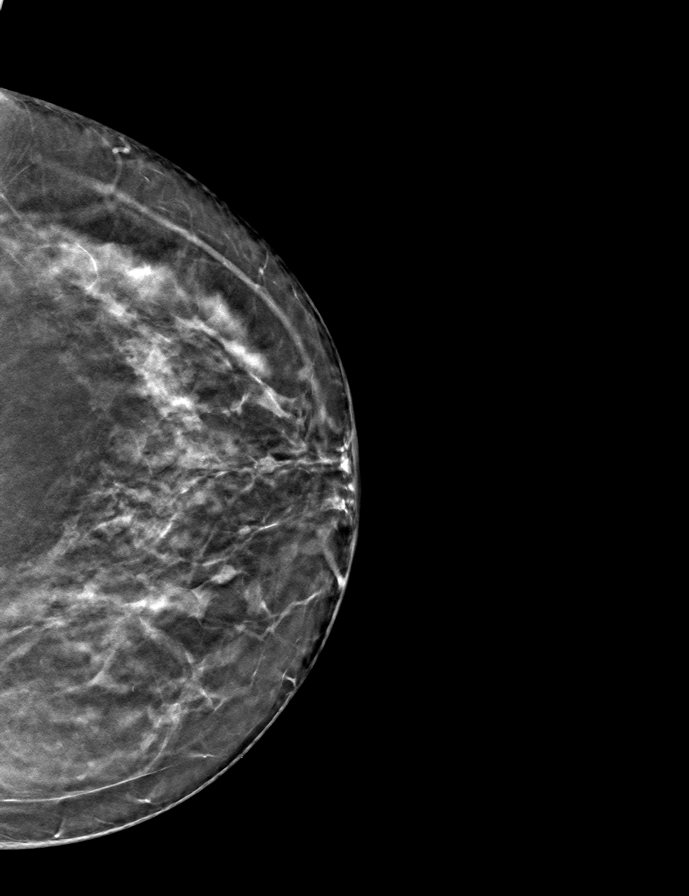

[9 of 24 positions shown; findings below may reference images not displayed]

ACR Breast Density Category c: The breast tissue is heterogeneously
dense, which may obscure small masses
FINDINGS: There are no findings suspicious for malignancy. Images were
processed with CAD.
IMPRESSION: No mammographic evidence of malignancy. A result letter of this
screening mammogram will be mailed directly to the patient.

RECOMMENDATION:
Screening mammogram in one year. (Code:EM-2-IHY)

BI-RADS CATEGORY  1: Negative.

## 2021-02-08 ENCOUNTER — Other Ambulatory Visit: Payer: Self-pay | Admitting: Emergency Medicine

## 2021-03-19 ENCOUNTER — Other Ambulatory Visit: Payer: Self-pay | Admitting: Emergency Medicine

## 2021-03-19 DIAGNOSIS — Z8719 Personal history of other diseases of the digestive system: Secondary | ICD-10-CM

## 2021-04-17 ENCOUNTER — Other Ambulatory Visit: Payer: Self-pay

## 2021-04-17 ENCOUNTER — Ambulatory Visit (INDEPENDENT_AMBULATORY_CARE_PROVIDER_SITE_OTHER): Payer: 59 | Admitting: Emergency Medicine

## 2021-04-17 ENCOUNTER — Encounter: Payer: Self-pay | Admitting: Emergency Medicine

## 2021-04-17 VITALS — BP 130/70 | HR 77 | Temp 98.1°F | Ht 61.0 in | Wt 166.0 lb

## 2021-04-17 DIAGNOSIS — J449 Chronic obstructive pulmonary disease, unspecified: Secondary | ICD-10-CM

## 2021-04-17 DIAGNOSIS — B349 Viral infection, unspecified: Secondary | ICD-10-CM

## 2021-04-17 DIAGNOSIS — G4733 Obstructive sleep apnea (adult) (pediatric): Secondary | ICD-10-CM

## 2021-04-17 DIAGNOSIS — E785 Hyperlipidemia, unspecified: Secondary | ICD-10-CM | POA: Diagnosis not present

## 2021-04-17 DIAGNOSIS — I10 Essential (primary) hypertension: Secondary | ICD-10-CM | POA: Diagnosis not present

## 2021-04-17 DIAGNOSIS — R7303 Prediabetes: Secondary | ICD-10-CM

## 2021-04-17 DIAGNOSIS — Z8669 Personal history of other diseases of the nervous system and sense organs: Secondary | ICD-10-CM

## 2021-04-17 DIAGNOSIS — Z8719 Personal history of other diseases of the digestive system: Secondary | ICD-10-CM

## 2021-04-17 DIAGNOSIS — M159 Polyosteoarthritis, unspecified: Secondary | ICD-10-CM

## 2021-04-17 DIAGNOSIS — C4922 Malignant neoplasm of connective and soft tissue of left lower limb, including hip: Secondary | ICD-10-CM

## 2021-04-17 DIAGNOSIS — G43709 Chronic migraine without aura, not intractable, without status migrainosus: Secondary | ICD-10-CM

## 2021-04-17 LAB — CBC WITH DIFFERENTIAL/PLATELET
Basophils Absolute: 0 10*3/uL (ref 0.0–0.1)
Basophils Relative: 0.4 % (ref 0.0–3.0)
Eosinophils Absolute: 0.1 10*3/uL (ref 0.0–0.7)
Eosinophils Relative: 1.6 % (ref 0.0–5.0)
HCT: 44.1 % (ref 36.0–46.0)
Hemoglobin: 14.3 g/dL (ref 12.0–15.0)
Lymphocytes Relative: 38.5 % (ref 12.0–46.0)
Lymphs Abs: 3.4 10*3/uL (ref 0.7–4.0)
MCHC: 32.4 g/dL (ref 30.0–36.0)
MCV: 84.7 fl (ref 78.0–100.0)
Monocytes Absolute: 0.6 10*3/uL (ref 0.1–1.0)
Monocytes Relative: 6.5 % (ref 3.0–12.0)
Neutro Abs: 4.6 10*3/uL (ref 1.4–7.7)
Neutrophils Relative %: 53 % (ref 43.0–77.0)
Platelets: 258 10*3/uL (ref 150.0–400.0)
RBC: 5.21 Mil/uL — ABNORMAL HIGH (ref 3.87–5.11)
RDW: 14.1 % (ref 11.5–15.5)
WBC: 8.8 10*3/uL (ref 4.0–10.5)

## 2021-04-17 LAB — HEMOGLOBIN A1C: Hgb A1c MFr Bld: 6.1 % (ref 4.6–6.5)

## 2021-04-17 MED ORDER — UBRELVY 100 MG PO TABS: 100.0000 mg | ORAL_TABLET | Freq: Every day | ORAL | 1 refills | Status: DC | PRN

## 2021-04-17 MED ORDER — SIMVASTATIN 20 MG PO TABS: 20.0000 mg | ORAL_TABLET | Freq: Every day | ORAL | 3 refills | Status: DC

## 2021-04-17 MED ORDER — MELOXICAM 15 MG PO TABS: 15.0000 mg | ORAL_TABLET | Freq: Every day | ORAL | 0 refills | Status: DC

## 2021-04-17 MED ORDER — MONTELUKAST SODIUM 10 MG PO TABS: 10.0000 mg | ORAL_TABLET | Freq: Every day | ORAL | 3 refills | Status: DC

## 2021-04-17 MED ORDER — ALBUTEROL SULFATE HFA 108 (90 BASE) MCG/ACT IN AERS: 2.0000 | INHALATION_SPRAY | RESPIRATORY_TRACT | 3 refills | Status: DC | PRN

## 2021-04-17 MED ORDER — ALBUTEROL SULFATE (2.5 MG/3ML) 0.083% IN NEBU: 2.5000 mg | INHALATION_SOLUTION | Freq: Four times a day (QID) | RESPIRATORY_TRACT | 3 refills | Status: DC | PRN

## 2021-04-17 MED ORDER — DILTIAZEM HCL ER COATED BEADS 240 MG PO CP24: 240.0000 mg | ORAL_CAPSULE | Freq: Every day | ORAL | 3 refills | Status: DC

## 2021-04-17 MED ORDER — OMEPRAZOLE 40 MG PO CPDR: 40.0000 mg | DELAYED_RELEASE_CAPSULE | Freq: Every day | ORAL | 3 refills | Status: DC

## 2021-04-17 NOTE — Patient Instructions (Signed)
Mantenimiento de la salud despu?s de los 65 a?os de edad ?Health Maintenance After Age 74 ?Despu?s de los 65 a?os de edad, corre un riesgo mayor de padecer ciertas enfermedades e infecciones a largo plazo, como tambi?n de sufrir lesiones por ca?das. Las ca?das son la causa principal de las fracturas de huesos y lesiones en la cabeza de personas mayores de 65 a?os de edad. Recibir cuidados preventivos de forma regular puede ayudarlo a mantenerse saludable y en buen estado. Los cuidados preventivos incluyen realizarse an?lisis de forma regular y realizar cambios en el estilo de vida seg?n las recomendaciones del m?dico. Converse con el m?dico sobre lo siguiente: ?Las pruebas de detecci?n y los an?lisis que debe realizarse. Una prueba de detecci?n es un estudio que se para detectar la presencia de una enfermedad cuando no tiene s?ntomas. ?Un plan de dieta y ejercicios adecuado para usted. ??Qu? debo saber sobre las pruebas de detecci?n y los an?lisis para prevenir ca?das? ?Realizarse pruebas de detecci?n y an?lisis es la mejor manera de detectar un problema de salud de forma temprana. El diagn?stico y tratamiento tempranos le brindan la mejor oportunidad de controlar las afecciones m?dicas que son comunes despu?s de los 65 a?os de edad. Ciertas afecciones y elecciones de estilo de vida pueden hacer que sea m?s propenso a sufrir una ca?da. El m?dico puede recomendarle lo siguiente: ?Controles regulares de la visi?n. Una visi?n deficiente y afecciones como las cataratas pueden hacer que sea m?s propenso a sufrir una ca?da. Si usa lentes, aseg?rese de obtener una receta actualizada si su visi?n cambia. ?Revisi?n de medicamentos. Revise regularmente con el m?dico todos los medicamentos que toma, incluidos los medicamentos de venta libre. Consulte al m?dico sobre los efectos secundarios que pueden hacer que sea m?s propenso a sufrir una ca?da. Informe al m?dico si alguno de los medicamentos que toma lo hace sentir mareado o  somnoliento. ?Controles de fuerza y equilibrio. El m?dico puede recomendar ciertos estudios para controlar su fuerza y equilibrio al estar de pie, al caminar o al cambiar de posici?n. ?Examen de los pies. El dolor y el adormecimiento en los pies, como tambi?n no utilizar el calzado adecuado, pueden hacer que sea m?s propenso a sufrir una ca?da. ?Pruebas de detecci?n, que incluyen las siguientes: ?Pruebas de detecci?n para la osteoporosis. La osteoporosis es una afecci?n que hace que los huesos se tornen m?s d?biles y se quiebren con m?s facilidad. ?Pruebas de detecci?n para la presi?n arterial. Los cambios en la presi?n arterial y los medicamentos para controlar la presi?n arterial pueden hacerlo sentir mareado. ?Prueba de detecci?n de la depresi?n. Es m?s probable que sufra una ca?da si tiene miedo a caerse, se siente deprimido o se siente incapaz de realizar actividades que sol?a hacer. ?Prueba de detecci?n de consumo de alcohol. Beber demasiado alcohol puede afectar su equilibrio y puede hacer que sea m?s propenso a sufrir una ca?da. ?Siga estas indicaciones en su casa: ?Estilo de vida ?No beba alcohol si: ?Su m?dico le indica no hacerlo. ?Si bebe alcohol: ?Limite la cantidad que bebe a lo siguiente: ?De 0 a 1 medida por d?a para las mujeres. ?De 0 a 2 medidas por d?a para los hombres. ?Sepa cu?nta cantidad de alcohol hay en las bebidas que toma. En los [place], una medida equivale a una botella de cerveza de 12 oz (355 ml), un vaso de vino de 5 oz (148 ml) o un vaso de una bebida alcoh?lica de alta graduaci?n de 1? oz (44 ml). ?No consuma ning?n producto que   contenga nicotina o tabaco. Estos productos incluyen cigarrillos, tabaco para mascar y aparatos de vapeo, como los cigarrillos electr?nicos. Si necesita ayuda para dejar de consumir estos productos, consulte al m?dico. ?Actividad ? ?Siga un programa de ejercicio regular para mantenerse en forma. Esto lo ayudar? a mantener el equilibrio. Consulte al  m?dico qu? tipos de ejercicios son adecuados para usted. ?Si necesita un bast?n o un andador, ?selo seg?n las recomendaciones del m?dico. ?Utilice calzado con buen apoyo y suela antideslizante. ?Seguridad ? ?Retire los objetos que puedan causar tropiezos tales como alfombras, cables u obst?culos. ?Instale equipos de seguridad, como barras para sost?n en los ba?os y barandas de seguridad en las escaleras. ?Mantenga las habitaciones y los pasillos bien iluminados. ?Indicaciones generales ?Hable con el m?dico sobre sus riesgos de sufrir una ca?da. Inf?rmele a su m?dico si: ?Se cae. Aseg?rese de informarle a su m?dico acerca de todas las ca?das, incluso aquellas que parecen ser menores. ?Se siente mareado, cansado (tiene fatiga) o siente que pierde el equilibrio. ?Use los medicamentos de venta libre y los recetados solamente como se lo haya indicado el m?dico. Estos incluyen suplementos. ?Siga una dieta sana y mantenga un peso saludable. Una dieta saludable incluye productos l?cteos descremados, carnes bajas en contenido de grasa (magras), fibra de granos enteros, frijoles y muchas frutas y verduras. ?Mant?ngase al d?a con las vacunas. ?Real?cese los estudios de rutina de la salud, dentales y de la vista. ?Resumen ?Tener un estilo de vida saludable y recibir cuidados preventivos pueden ayudar a promover la salud y el bienestar despu?s de los 65 a?os de edad. ?Realizarse pruebas de detecci?n y an?lisis es la mejor manera de detectar un problema de salud de forma temprana y ayudarlo a evitar una ca?da. El diagn?stico y tratamiento tempranos le brindan la mejor oportunidad de controlar las afecciones m?dicas m?s comunes en las personas mayores de 65 a?os de edad. ?Las ca?das son la causa principal de las fracturas de huesos y lesiones en la cabeza de personas mayores de 65 a?os de edad. Tome precauciones para evitar una ca?da en su casa. ?Trabaje con el m?dico para saber qu? cambios que puede hacer para mejorar su salud y  bienestar, y para prevenir las ca?das. ?Esta informaci?n no tiene como fin reemplazar el consejo del m?dico. Aseg?rese de hacerle al m?dico cualquier pregunta que tenga. ?Document Revised: 07/25/2020 Document Reviewed: 07/25/2020 ?Elsevier Patient Education ? 2022 Elsevier Inc. ? ?

## 2021-04-17 NOTE — Assessment & Plan Note (Signed)
Stable.  Had episode of labyrinthitis recently.  Gradually improving.

## 2021-04-17 NOTE — Assessment & Plan Note (Signed)
Stable.  Very well controlled on Trelegy daily.

## 2021-04-17 NOTE — Assessment & Plan Note (Signed)
Stable. Asymptomatic.

## 2021-04-17 NOTE — Assessment & Plan Note (Signed)
Well-controlled hypertension with normal blood pressure readings at home. Has been on Cardizem for many years.  We will continue 240 mg daily. Dietary approaches to stop hypertension discussed.

## 2021-04-17 NOTE — Assessment & Plan Note (Signed)
Stable but unable to get off PPI.  Continue omeprazole 40 mg daily.

## 2021-04-17 NOTE — Assessment & Plan Note (Signed)
Active and affecting quality of life.  Continue Tylenol start meloxicam 15 mg as needed.

## 2021-04-17 NOTE — Progress Notes (Signed)
Dana Daniels Colon 76 y.o.   Chief Complaint  Patient presents with   Headache    Dizziness, pt states she is ok today but was happening last week.   Medication Refill    HISTORY OF PRESENT ILLNESS: This is a 76 y.o. female complaining of several things: 1.  Multiple joint pains.  Has history of osteoarthritis.  Advil gives her tachycardia but Motrin works rather well.  Takes Tylenol as needed. 2.  Sore throat of several days duration but getting better.  No associated symptoms.  No fever or chills. 3.  History of migraines with recent episodes. 4.  Vertigo episode 2 weeks ago which lasted about 5 days.  Gone now. 5.  History of COPD.  Trelegy working very well.  No frequent use of albuterol. 6.  History of dyslipidemia on simvastatin 7.  History of hypertension on Cardizem for many years. 8.  Prediabetes Needs medication refills. No other complaints or any other medical concerns today.  Headache  Associated symptoms include a sore throat. Pertinent negatives include no abdominal pain, coughing, dizziness, fever, nausea or vomiting.  Medication Refill Associated symptoms include headaches and a sore throat. Pertinent negatives include no abdominal pain, chest pain, chills, coughing, fever, nausea, rash or vomiting.    Prior to Admission medications   Medication Sig Start Date End Date Taking? Authorizing Provider  albuterol (PROVENTIL) (2.5 MG/3ML) 0.083% nebulizer solution Take 3 mLs (2.5 mg total) by nebulization every 6 (six) hours as needed for wheezing or shortness of breath. 07/02/20  Yes Dailyn Kempner, Ines Bloomer, MD  albuterol (VENTOLIN HFA) 108 (90 Base) MCG/ACT inhaler Inhale 2 puffs into the lungs every 4 (four) hours as needed for wheezing or shortness of breath. 07/24/20  Yes Arden Axon, Ines Bloomer, MD  cetirizine (ZYRTEC) 10 MG tablet Take 1 tablet (10 mg total) by mouth daily for 7 days. 03/31/18 04/17/21 Yes Bryony Kaman, Ines Bloomer, MD  cromolyn (NASALCROM) 5.2 MG/ACT  nasal spray Place 1 spray into both nostrils 2 (two) times daily. 01/13/19  Yes Doc Mandala, Ines Bloomer, MD  diltiazem (CARDIZEM CD) 240 MG 24 hr capsule Take 1 capsule (240 mg total) by mouth daily. 05/18/20 04/17/21 Yes Winona Sison, Ines Bloomer, MD  Fluticasone-Umeclidin-Vilant (TRELEGY ELLIPTA) 100-62.5-25 MCG/ACT AEPB Inhale 1 puff into the lungs daily. 12/27/20  Yes Lynita Groseclose, Ines Bloomer, MD  montelukast (SINGULAIR) 10 MG tablet Take 1 tablet (10 mg total) by mouth at bedtime. 05/18/20 04/17/21 Yes Sanita Estrada, Ines Bloomer, MD  nitroGLYCERIN (NITROSTAT) 0.4 MG SL tablet DISSOLVE ONE TABLET UNDER THE TONGUE EVERY 5 MINUTES AS NEEDED FOR CHEST PAIN.  DO NOT EXCEED A TOTAL OF 3 DOSES IN 15 MINUTES 02/08/21  Yes Trew Sunde, Ines Bloomer, MD  omeprazole (PRILOSEC) 40 MG capsule Take 1 capsule by mouth once daily 03/19/21  Yes Agnes Probert, Ines Bloomer, MD  simvastatin (ZOCOR) 20 MG tablet Take 1 tablet (20 mg total) by mouth daily. 07/24/20 04/17/21 Yes Fritz Cauthon, Ines Bloomer, MD  benzonatate (TESSALON) 200 MG capsule Take 1 capsule (200 mg total) by mouth 2 (two) times daily as needed for cough. Patient not taking: Reported on 04/17/2021 10/30/20   Horald Pollen, MD  HYDROcodone bit-homatropine [facility]) 5-1.5 MG/5ML syrup Take 5 mLs by mouth at bedtime as needed for cough. Patient not taking: Reported on 04/17/2021 10/30/20   Horald Pollen, MD  valACYclovir (VALTREX) 1000 MG tablet Take 1 tablet (1,000 mg total) by mouth 2 (two) times daily. 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   History of gastroesophageal reflux (GERD) 09/25/2017   Osteoarthritis of multiple joints 03/24/2017   Chronic pain of left knee 03/24/2017   Chronic obstructive pulmonary disease ([place]) 03/24/2017   Primary  cancer of skin of left foot 02/03/2017   Mild intermittent asthma without complication 56/38/7564   Sleep apnea 02/03/2017   Dyslipidemia 02/03/2017   Migraine 02/03/2017   Essential hypertension 09/18/2008    Past Medical History:  Diagnosis Date   Allergy    Arthritis    Asthma    Blood transfusion without reported diagnosis    Cataract    Hyperlipidemia    Hypertension    Migraine    Osteoporosis    Sleep apnea     Past Surgical History:  Procedure Laterality Date   ABDOMINAL HYSTERECTOMY     CHOLECYSTECTOMY     EYE SURGERY Left    cataract    FOOT SURGERY Left     Social History   Socioeconomic History   Marital status: Widowed    Spouse name: Not on file   Number of children: Not on file   Years of education: Not on file   Highest education level: Not on file  Occupational History   Not on file  Tobacco Use   Smoking status: Never   Smokeless tobacco: Never  Vaping Use   Vaping Use: Never used  Substance and Sexual Activity   Alcohol use: No   Drug use: Never   Sexual activity: Not on file  Other Topics Concern   Not on file  Social History Narrative   Not on file   Social Determinants of Health   Financial Resource Strain: Not on file  Food Insecurity: Not on file  Transportation Needs: Not on file  Physical Activity: Not on file  Stress: Not on file  Social Connections: Not on file  Intimate Partner Violence: Not on file    Family History  Problem Relation Age of Onset   Heart disease Mother    Hyperlipidemia Mother    Hypertension Mother    Stroke Mother    Heart disease Father    Stroke Father    Hypertension Son    Hyperlipidemia Son    Fibromyalgia Son    Lupus Son    Hypertension Son    Diabetes Daughter    Hypertension Daughter      Review of Systems  Constitutional: Negative.  Negative for chills and fever.  HENT:  Positive for sore throat.   Eyes: Negative.   Respiratory: Negative.  Negative for cough and shortness  of breath.   Cardiovascular: Negative.  Negative for chest pain and palpitations.  Gastrointestinal: Negative.  Negative for abdominal pain, diarrhea, nausea and vomiting.  Genitourinary: Negative.  Negative for dysuria and hematuria.  Musculoskeletal:  Positive for joint pain.  Skin: Negative.  Negative for rash.  Neurological:  Positive for headaches. Negative for dizziness.  All other systems reviewed and are negative.   Physical Exam Vitals reviewed.  Constitutional:      Appearance: Normal appearance. She is well-developed.  HENT:     Head: Normocephalic.     Right Ear: Tympanic membrane, ear canal and external ear normal.     Left Ear: Tympanic membrane, ear canal and external ear normal.     Mouth/Throat:  Mouth: Mucous membranes are moist.     Pharynx: Oropharynx is clear. Posterior oropharyngeal erythema present. No oropharyngeal exudate.  Eyes:     Extraocular Movements: Extraocular movements intact.     Conjunctiva/sclera: Conjunctivae normal.     Pupils: Pupils are equal, round, and reactive to light.  Cardiovascular:     Rate and Rhythm: Normal rate and regular rhythm.     Pulses: Normal pulses.     Heart sounds: Normal heart sounds.  Pulmonary:     Effort: Pulmonary effort is normal.     Breath sounds: Normal breath sounds.  Abdominal:     Palpations: Abdomen is soft.     Tenderness: There is no abdominal tenderness.  Musculoskeletal:     Cervical back: Neck supple. No tenderness.     Right lower leg: No edema.     Left lower leg: No edema.  Lymphadenopathy:     Cervical: No cervical adenopathy.  Skin:    General: Skin is warm and dry.     Capillary Refill: Capillary refill takes less than 2 seconds.  Neurological:     General: No focal deficit present.     Mental Status: She is alert and oriented to person, place, and time.  Psychiatric:        Mood and Affect: Mood normal.        Behavior: Behavior normal.     ASSESSMENT & PLAN: A total of  47-minutes was spent with the patient and counseling/coordination of care regarding preparing for this visit, review of most recent office visit notes, review of multiple chronic medical problems and their management, review of all medications, review of most recent blood work results, new medication for migraine headaches and generalized osteoarthritis, education on nutrition, prognosis, documentation and need for follow-up.  Problem List Items Addressed This Visit       Cardiovascular and Mediastinum   Essential hypertension    Well-controlled hypertension with normal blood pressure readings at home. Has been on Cardizem for many years.  We will continue 240 mg daily. Dietary approaches to stop hypertension discussed.       Relevant Medications   simvastatin (ZOCOR) 20 MG tablet   diltiazem (CARDIZEM CD) 240 MG 24 hr capsule   Other Relevant Orders   CBC with Differential/Platelet   Comprehensive metabolic panel   Migraine    Frequent episodes and affecting quality of life. We will try Ubrelvy 100 mg at onset of headaches.      Relevant Medications   simvastatin (ZOCOR) 20 MG tablet   diltiazem (CARDIZEM CD) 240 MG 24 hr capsule   meloxicam (MOBIC) 15 MG tablet   Ubrogepant (UBRELVY) 100 MG TABS     Respiratory   Sleep apnea   Chronic obstructive pulmonary disease (HCC) - Primary    Stable.  Very well controlled on Trelegy daily.      Relevant Medications   albuterol (VENTOLIN HFA) 108 (90 Base) MCG/ACT inhaler   albuterol (PROVENTIL) (2.5 MG/3ML) 0.083% nebulizer solution   montelukast (SINGULAIR) 10 MG tablet     Musculoskeletal and Integument   Osteoarthritis of multiple joints    Active and affecting quality of life.  Continue Tylenol start meloxicam 15 mg as needed.      Relevant Medications   meloxicam (MOBIC) 15 MG tablet   Other Relevant Orders   CBC with Differential/Platelet   Uric acid     Other   Dyslipidemia    Stable.  Continue simvastatin 20 mg  daily. Diet  and nutrition discussed. The 10-year ASCVD risk score (Arnett DK, et al., 2019) is: 16.9%   Values used to calculate the score:     Age: 45 years     Sex: Female     Is Non-Hispanic African American: No     Diabetic: No     Tobacco smoker: No     Systolic Blood Pressure: 170 mmHg     Is BP treated: Yes     HDL Cholesterol: 52.5 mg/dL     Total Cholesterol: 149 mg/dL       Relevant Medications   simvastatin (ZOCOR) 20 MG tablet   Other Relevant Orders   Lipid panel   History of gastroesophageal reflux (GERD)    Stable but unable to get off PPI.  Continue omeprazole 40 mg daily.      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  Asymptomatic.      Viral illness    Stable.  Had episode of labyrinthitis recently.  Gradually improving.      Other Visit Diagnoses     Prediabetes       Relevant Orders   Hemoglobin A1c   History of migraine       Relevant Medications   Ubrogepant (UBRELVY) 100 MG TABS      Patient Instructions  Mantenimiento de la salud despus de los 30 aos de edad Health Maintenance After Age 58 Despus de los 65 aos de edad, corre un riesgo mayor de Tourist information centre manager enfermedades e infecciones a Barrister's clerk, como tambin de sufrir lesiones por cadas. Las cadas son la causa principal de las fracturas de huesos y lesiones en la cabeza de personas mayores de 51 aos de edad. Recibir cuidados preventivos de forma regular puede ayudarlo a mantenerse saludable y en buen [place]. Los cuidados preventivos incluyen realizarse anlisis de forma regular y Actor en el estilo de vida segn las recomendaciones del mdico. Converse con el mdico sobre lo siguiente: Las pruebas de deteccin y los anlisis que debe Dispensing optician. Una prueba de deteccin es un estudio que se para Hydrographic surveyor la presencia de una enfermedad cuando no tiene sntomas. Un plan de dieta y  ejercicios adecuado para usted. Qu debo saber sobre las pruebas de deteccin y los anlisis para prevenir cadas? Realizarse pruebas de deteccin y [employer] es la mejor manera de Hydrographic surveyor un problema de salud de forma temprana. El diagnstico y tratamiento tempranos le brindan la mejor oportunidad de Chief Technology Officer las afecciones mdicas que son comunes despus de los 61 aos de edad. Ciertas afecciones y elecciones de estilo de vida pueden hacer que sea ms propenso a sufrir Engineer, manufacturing. El mdico puede recomendarle lo siguiente: Controles regulares de la visin. Una visin deficiente y afecciones como las cataratas pueden hacer que sea ms propenso a sufrir Engineer, manufacturing. Si [place] lentes, asegrese de obtener una receta actualizada si su visin cambia. Revisin de medicamentos. Revise regularmente con el mdico todos los medicamentos que toma, incluidos los medicamentos de [place]. Consulte al [employer] efectos secundarios que pueden hacer que sea ms propenso a sufrir Engineer, manufacturing. Informe al mdico si alguno de los medicamentos que toma lo hace sentir mareado o somnoliento. Controles de fuerza y equilibrio. El mdico puede recomendar ciertos estudios para controlar su fuerza y equilibrio al estar de pie, al caminar o al cambiar de posicin. Examen de los pies. El dolor y  el adormecimiento en los pies, como tambin no utilizar el calzado [place], pueden hacer que sea ms propenso a sufrir Engineer, manufacturing. Pruebas de deteccin, que incluyen las siguientes: Pruebas de deteccin para la osteoporosis. La osteoporosis es una afeccin que hace que los huesos se tornen ms dbiles y se quiebren con ms facilidad. Pruebas de deteccin para la presin arterial. Los cambios en la presin arterial y los medicamentos para Chief Technology Officer la presin arterial pueden hacerlo sentir mareado. Prueba de deteccin de la depresin. Es ms probable que sufra una cada si tiene miedo a caerse, se siente deprimido o se siente incapaz de  Patent examiner. Prueba de deteccin de consumo de alcohol. Beber demasiado alcohol puede afectar su equilibrio y puede hacer que sea ms propenso a sufrir Engineer, manufacturing. Siga estas indicaciones en su casa: Estilo de vida No beba alcohol si: Su mdico le indica no hacerlo. Si bebe alcohol: Limite la cantidad que bebe a lo siguiente: De 0 a 1 medida por da para las mujeres. De 0 a 2 medidas por da para los hombres. Sepa cunta cantidad de alcohol hay en las bebidas que toma. En los [place], una medida equivale a una botella de cerveza de 12 oz (355 ml), un vaso de vino de 5 oz (148 ml) o un vaso de una bebida alcohlica de alta graduacin de 1 oz (44 ml). No consuma ningn producto que contenga nicotina o tabaco. Estos productos incluyen cigarrillos, tabaco para Higher education careers adviser y aparatos de vapeo, como los Psychologist, sport and exercise. Si necesita ayuda para dejar de consumir estos productos, consulte al [employer]. Actividad  Siga un programa de ejercicio regular para mantenerse en forma. Esto lo ayudar a Contractor equilibrio. Consulte al mdico qu tipos de ejercicios son adecuados para usted. Si necesita un bastn o un andador, selo segn las recomendaciones del mdico. Utilice calzado con buen apoyo y suela antideslizante. Seguridad  Retire los [place] puedan causar tropiezos tales como alfombras, cables u obstculos. Instale equipos de seguridad, como barras para sostn en los baos y barandas de seguridad en las escaleras. [place] habitaciones y los pasillos bien iluminados. Indicaciones generales Hable con el mdico sobre sus riesgos de sufrir una cada. Infrmele a su mdico si: Se cae. Asegrese de informarle a su mdico acerca de todas las cadas, incluso aquellas que parecen ser [employer] Se siente mareado, cansado (tiene fatiga) o siente que pierde el equilibrio. Use los medicamentos de venta libre y los recetados solamente como se lo haya indicado el  mdico. Estos incluyen suplementos. Siga una dieta sana y [place] un peso saludable. Una dieta saludable incluye productos lcteos descremados, carnes bajas en contenido de grasa ([place]), fibra de granos enteros, frijoles y [place] frutas y verduras. [place]. Realcese los estudios de rutina de la salud, dentales y de Public librarian. Resumen Tener un estilo de vida saludable y recibir cuidados preventivos pueden ayudar a Theatre stage manager salud y el bienestar despus de los 75 aos de [place]. Realizarse pruebas de deteccin y [employer] es la mejor manera de Hydrographic surveyor un problema de salud de forma temprana y Lourena Simmonds a Product/process development scientist una cada. El diagnstico y tratamiento tempranos le brindan la mejor oportunidad de Chief Technology Officer las afecciones mdicas ms comunes en las personas mayores de 38 aos de edad. Las cadas son la causa principal de las fracturas de huesos y lesiones en la cabeza de personas mayores de 57 aos de edad. Tome precauciones para evitar una cada en su casa.  Trabaje con el mdico para saber qu cambios que puede hacer para mejorar su salud y [place], y [place]. Esta informacin no tiene Marine scientist el consejo del mdico. Asegrese de hacerle al mdico cualquier pregunta que tenga. Document Revised: 07/25/2020 Document Reviewed: 07/25/2020 Elsevier Patient Education  2022 [place], MD [facility]

## 2021-04-17 NOTE — Assessment & Plan Note (Signed)
Stable.  Continue simvastatin 20 mg daily. Diet and nutrition discussed. The 10-year ASCVD risk score (Arnett DK, et al., 2019) is: 16.9%   Values used to calculate the score:     Age: 76 years     Sex: Female     Is Non-Hispanic African American: No     Diabetic: No     Tobacco smoker: No     Systolic Blood Pressure: 462 mmHg     Is BP treated: Yes     HDL Cholesterol: 52.5 mg/dL     Total Cholesterol: 149 mg/dL

## 2021-04-17 NOTE — Assessment & Plan Note (Signed)
Frequent episodes and affecting quality of life. We will try Ubrelvy 100 mg at onset of headaches.

## 2021-04-18 LAB — LIPID PANEL
Cholesterol: 175 mg/dL (ref 0–200)
HDL: 50.3 mg/dL (ref >39.00)
LDL Cholesterol: 88 mg/dL (ref 0–99)
NonHDL: 124.24
Total CHOL/HDL Ratio: 3
Triglycerides: 179 mg/dL — ABNORMAL HIGH (ref 0.0–149.0)
VLDL: 35.8 mg/dL (ref 0.0–40.0)

## 2021-04-18 LAB — COMPREHENSIVE METABOLIC PANEL
ALT: 15 U/L (ref 0–35)
AST: 13 U/L (ref 0–37)
Albumin: 4.2 g/dL (ref 3.5–5.2)
Alkaline Phosphatase: 77 U/L (ref 39–117)
BUN: 10 mg/dL (ref 6–23)
CO2: 28 mEq/L (ref 19–32)
Calcium: 8.8 mg/dL (ref 8.4–10.5)
Chloride: 106 mEq/L (ref 96–112)
Creatinine, Ser: 0.78 mg/dL (ref 0.40–1.20)
GFR: 75.25 mL/min (ref >60.00)
Glucose, Bld: 103 mg/dL — ABNORMAL HIGH (ref 70–99)
Potassium: 3.8 mEq/L (ref 3.5–5.1)
Sodium: 141 mEq/L (ref 135–145)
Total Bilirubin: 0.3 mg/dL (ref 0.2–1.2)
Total Protein: 7 g/dL (ref 6.0–8.3)

## 2021-04-18 LAB — URIC ACID: Uric Acid, Serum: 4.8 mg/dL (ref 2.4–7.0)

## 2021-06-13 ENCOUNTER — Encounter: Payer: Self-pay | Admitting: Emergency Medicine

## 2021-06-13 MED ORDER — MONTELUKAST SODIUM 10 MG PO TABS: 10.0000 mg | ORAL_TABLET | Freq: Every day | ORAL | 1 refills | Status: DC

## 2021-08-05 ENCOUNTER — Encounter: Payer: Self-pay | Admitting: Emergency Medicine

## 2021-08-05 ENCOUNTER — Ambulatory Visit (INDEPENDENT_AMBULATORY_CARE_PROVIDER_SITE_OTHER): Payer: 59 | Admitting: Emergency Medicine

## 2021-08-05 VITALS — BP 130/70 | HR 91 | Temp 98.4°F | Ht 61.0 in | Wt 169.4 lb

## 2021-08-05 DIAGNOSIS — I10 Essential (primary) hypertension: Secondary | ICD-10-CM | POA: Diagnosis not present

## 2021-08-05 DIAGNOSIS — E785 Hyperlipidemia, unspecified: Secondary | ICD-10-CM

## 2021-08-05 DIAGNOSIS — Z1231 Encounter for screening mammogram for malignant neoplasm of breast: Secondary | ICD-10-CM

## 2021-08-05 DIAGNOSIS — M79672 Pain in left foot: Secondary | ICD-10-CM

## 2021-08-05 DIAGNOSIS — J302 Other seasonal allergic rhinitis: Secondary | ICD-10-CM

## 2021-08-05 DIAGNOSIS — Z8719 Personal history of other diseases of the digestive system: Secondary | ICD-10-CM

## 2021-08-05 DIAGNOSIS — M79671 Pain in right foot: Secondary | ICD-10-CM

## 2021-08-05 DIAGNOSIS — J449 Chronic obstructive pulmonary disease, unspecified: Secondary | ICD-10-CM

## 2021-08-05 MED ORDER — IBUPROFEN 600 MG PO TABS: 600.0000 mg | ORAL_TABLET | Freq: Three times a day (TID) | ORAL | 1 refills | Status: DC | PRN

## 2021-08-05 MED ORDER — SIMVASTATIN 20 MG PO TABS: 20.0000 mg | ORAL_TABLET | Freq: Every day | ORAL | 3 refills | Status: DC

## 2021-08-05 MED ORDER — OMEPRAZOLE 40 MG PO CPDR: 40.0000 mg | DELAYED_RELEASE_CAPSULE | Freq: Every day | ORAL | 3 refills | Status: DC

## 2021-08-05 MED ORDER — CETIRIZINE HCL 10 MG PO TABS: 10.0000 mg | ORAL_TABLET | Freq: Every day | ORAL | 11 refills | Status: DC

## 2021-08-05 MED ORDER — DILTIAZEM HCL ER COATED BEADS 240 MG PO CP24: 240.0000 mg | ORAL_CAPSULE | Freq: Every day | ORAL | 3 refills | Status: DC

## 2021-08-05 NOTE — Assessment & Plan Note (Signed)
Well-controlled without recent exacerbations. Continue daily Trelegy and albuterol as needed.

## 2021-08-05 NOTE — Patient Instructions (Signed)
Hypertension, Adult High blood pressure (hypertension) is when the force of blood pumping through the arteries is too strong. The arteries are the blood vessels that carry blood from the heart throughout the body. Hypertension forces the heart to work harder to pump blood and may cause arteries to become narrow or stiff. Untreated or uncontrolled hypertension can lead to a heart attack, heart failure, a stroke, kidney disease, and other problems. A blood pressure reading consists of a higher number over a lower number. Ideally, your blood pressure should be below 120/80. The first ("top") number is called the systolic pressure. It is a measure of the pressure in your arteries as your heart beats. The second ("bottom") number is called the diastolic pressure. It is a measure of the pressure in your arteries as the heart relaxes. What are the causes? The exact cause of this condition is not known. There are some conditions that result in high blood pressure. What increases the risk? Certain factors may make you more likely to develop high blood pressure. Some of these risk factors are under your control, including: Smoking. Not getting enough exercise or physical activity. Being overweight. Having too much fat, sugar, calories, or salt (sodium) in your diet. Drinking too much alcohol. Other risk factors include: Having a personal history of heart disease, diabetes, high cholesterol, or kidney disease. Stress. Having a family history of high blood pressure and high cholesterol. Having obstructive sleep apnea. Age. The risk increases with age. What are the signs or symptoms? High blood pressure may not cause symptoms. Very high blood pressure (hypertensive crisis) may cause: Headache. Fast or irregular heartbeats (palpitations). Shortness of breath. Nosebleed. Nausea and vomiting. Vision changes. Severe chest pain, dizziness, and seizures. How is this diagnosed? This condition is diagnosed by  measuring your blood pressure while you are seated, with your arm resting on a flat surface, your legs uncrossed, and your feet flat on the floor. The cuff of the blood pressure monitor will be placed directly against the skin of your upper arm at the level of your heart. Blood pressure should be measured at least twice using the same arm. Certain conditions can cause a difference in blood pressure between your right and left arms. If you have a high blood pressure reading during one visit or you have normal blood pressure with other risk factors, you may be asked to: Return on a different day to have your blood pressure checked again. Monitor your blood pressure at home for 1 week or longer. If you are diagnosed with hypertension, you may have other blood or imaging tests to help your health care provider understand your overall risk for other conditions. How is this treated? This condition is treated by making healthy lifestyle changes, such as eating healthy foods, exercising more, and reducing your alcohol intake. You may be referred for counseling on a healthy diet and physical activity. Your health care provider may prescribe medicine if lifestyle changes are not enough to get your blood pressure under control and if: Your systolic blood pressure is above 130. Your diastolic blood pressure is above 80. Your personal target blood pressure may vary depending on your medical conditions, your age, and other factors. Follow these instructions at home: Eating and drinking  Eat a diet that is high in fiber and potassium, and low in sodium, added sugar, and fat. An example of this eating plan is called the DASH diet. DASH stands for Dietary Approaches to Stop Hypertension. To eat this way: Eat   plenty of fresh fruits and vegetables. Try to fill one half of your plate at each meal with fruits and vegetables. Eat whole grains, such as whole-wheat pasta, brown rice, or whole-grain bread. Fill about one  fourth of your plate with whole grains. Eat or drink low-fat dairy products, such as skim milk or low-fat yogurt. Avoid fatty cuts of meat, processed or cured meats, and poultry with skin. Fill about one fourth of your plate with lean proteins, such as fish, chicken without skin, beans, eggs, or tofu. Avoid pre-made and processed foods. These tend to be higher in sodium, added sugar, and fat. Reduce your daily sodium intake. Many people with hypertension should eat less than 1,500 mg of sodium a day. Do not drink alcohol if: Your health care provider tells you not to drink. You are pregnant, may be pregnant, or are planning to become pregnant. If you drink alcohol: Limit how much you have to: 0-1 drink a day for women. 0-2 drinks a day for men. Know how much alcohol is in your drink. In the [place] one drink equals one 12 oz bottle of beer (355 mL), one 5 oz glass of wine (148 mL), or one 1 oz glass of hard liquor (44 mL). Lifestyle  Work with your health care provider to maintain a healthy body weight or to lose weight. Ask what an ideal weight is for you. Get at least 30 minutes of exercise that causes your heart to beat faster (aerobic exercise) most days of the week. Activities may include walking, swimming, or biking. Include exercise to strengthen your muscles (resistance exercise), such as Pilates or lifting weights, as part of your weekly exercise routine. Try to do these types of exercises for 30 minutes at least 3 days a week. Do not use any products that contain nicotine or tobacco. These products include cigarettes, chewing tobacco, and vaping devices, such as e-cigarettes. If you need help quitting, ask your health care provider. Monitor your blood pressure at home as told by your health care provider. Keep all follow-up visits. This is important. Medicines Take over-the-counter and prescription medicines only as told by your health care provider. Follow directions carefully. Blood  pressure medicines must be taken as prescribed. Do not skip doses of blood pressure medicine. Doing this puts you at risk for problems and can make the medicine less effective. Ask your health care provider about side effects or reactions to medicines that you should watch for. Contact a health care provider if you: Think you are having a reaction to a medicine you are taking. Have headaches that keep coming back (recurring). Feel dizzy. Have swelling in your ankles. Have trouble with your vision. Get help right away if you: Develop a severe headache or confusion. Have unusual weakness or numbness. Feel faint. Have severe pain in your chest or abdomen. Vomit repeatedly. Have trouble breathing. These symptoms may be an emergency. Get help right away. Call 911. Do not wait to see if the symptoms will go away. Do not drive yourself to the hospital. Summary Hypertension is when the force of blood pumping through your arteries is too strong. If this condition is not controlled, it may put you at risk for serious complications. Your personal target blood pressure may vary depending on your medical conditions, your age, and other factors. For most people, a normal blood pressure is less than 120/80. Hypertension is treated with lifestyle changes, medicines, or a combination of both. Lifestyle changes include losing weight, eating a healthy,   low-sodium diet, exercising more, and limiting alcohol. This information is not intended to replace advice given to you by your health care provider. Make sure you discuss any questions you have with your health care provider. Document Revised: 12/25/2020 Document Reviewed: 12/25/2020 [employer] Patient Education  2023 [employer]  

## 2021-08-05 NOTE — Assessment & Plan Note (Signed)
Stable.  Diet and nutrition discussed. Continue simvastatin 20 mg daily. Cardiovascular risk associated with dyslipidemia and hypertension discussed.

## 2021-08-05 NOTE — Assessment & Plan Note (Signed)
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  Has seen podiatrist in the past.  Encouraged to follow-up with them.  Patient states Motrin works well for her. Prescription for 600 mg Motrin sent to pharmacy of record.

## 2021-08-05 NOTE — Assessment & Plan Note (Signed)
Well-controlled on omeprazole 40 mg daily.

## 2021-08-05 NOTE — Assessment & Plan Note (Signed)
Currently active.  Continue Singulair 10 mg and Zyrtec 10 mg daily as needed.

## 2021-08-05 NOTE — Assessment & Plan Note (Signed)
Elevated blood pressure reading in the office with normal readings at home.  Advised to continue monitoring blood pressure readings at home daily for the next several weeks and keep a log. Continue Cardizem 240 mg daily which she has been taking for over 20 years. Dietary approaches to stop hypertension discussed.

## 2021-08-05 NOTE — Progress Notes (Signed)
Dana Daniels 76 y.o.   Chief Complaint  Patient presents with   Foot Pain    Bil foot pain x 1 month    Cough    Pt has asthma , cough comes and goes     HISTORY OF PRESENT ILLNESS: This is a 76 y.o. female complaining of bilateral foot pain for 1 month. Also has history of asthma.  Takes Trelegy daily albuterol as needed Occasional cough related to seasonal allergies. No other complaints or medical concerns Needs medication refills History of hypertension.  Has been on Cardizem for over 15 years Normal blood pressure readings at home. BP Readings from Last 3 Encounters:  08/05/21 (!) 146/70  04/17/21 130/70  12/27/20 138/78     Foot Pain Associated symptoms include coughing. Pertinent negatives include no abdominal pain, chest pain, chills, congestion, fever, headaches, nausea, rash, sore throat or vomiting.  Cough Pertinent negatives include no chest pain, chills, fever, headaches, rash or sore throat.    Prior to Admission medications   Medication Sig Start Date End Date Taking? Authorizing Provider  albuterol (PROVENTIL) (2.5 MG/3ML) 0.083% nebulizer solution Take 3 mLs (2.5 mg total) by nebulization every 6 (six) hours as needed for wheezing or shortness of breath. 04/17/21  Yes Ameyah Bangura, Ines Bloomer, MD  albuterol (VENTOLIN HFA) 108 (90 Base) MCG/ACT inhaler Inhale 2 puffs into the lungs every 4 (four) hours as needed for wheezing or shortness of breath. 04/17/21  Yes Jalee Saine, Ines Bloomer, MD  cromolyn (NASALCROM) 5.2 MG/ACT nasal spray Place 1 spray into both nostrils 2 (two) times daily. 01/13/19  Yes Tiney Zipper, Ines Bloomer, MD  Fluticasone-Umeclidin-Vilant (TRELEGY ELLIPTA) 100-62.5-25 MCG/ACT AEPB Inhale 1 puff into the lungs daily. 12/27/20  Yes Ted Leonhart, Ines Bloomer, MD  meloxicam (MOBIC) 15 MG tablet Take 1 tablet (15 mg total) by mouth daily. 04/17/21  Yes Anjelique Makar, Ines Bloomer, MD  montelukast (SINGULAIR) 10 MG tablet Take 1 tablet (10 mg total) by mouth  at bedtime. 06/13/21 09/11/21 Yes Jase Reep, Ines Bloomer, MD  nitroGLYCERIN (NITROSTAT) 0.4 MG SL tablet DISSOLVE ONE TABLET UNDER THE TONGUE EVERY 5 MINUTES AS NEEDED FOR CHEST PAIN.  DO NOT EXCEED A TOTAL OF 3 DOSES IN 15 MINUTES 02/08/21  Yes Lougenia Morrissey, [place], MD  Ubrogepant (UBRELVY) 100 MG TABS Take 100 mg by mouth daily as needed. Take at onset of migraine headache 04/17/21  Yes Nanako Stopher, Ines Bloomer, MD  benzonatate (TESSALON) 200 MG capsule Take 1 capsule (200 mg total) by mouth 2 (two) times daily as needed for cough. Patient not taking: Reported on 04/17/2021 10/30/20   Horald Pollen, MD  cetirizine (ZYRTEC) 10 MG tablet Take 1 tablet (10 mg total) by mouth daily for 7 days. 03/31/18 04/17/21  Horald Pollen, MD  diltiazem (CARDIZEM CD) 240 MG 24 hr capsule Take 1 capsule (240 mg total) by mouth daily. 04/17/21 07/16/21  Horald Pollen, MD  HYDROcodone bit-homatropine [facility]) 5-1.5 MG/5ML syrup Take 5 mLs by mouth at bedtime as needed for cough. Patient not taking: Reported on 04/17/2021 10/30/20   Horald Pollen, MD  omeprazole (PRILOSEC) 40 MG capsule Take 1 capsule (40 mg total) by mouth daily. 04/17/21 07/16/21  Horald Pollen, MD  simvastatin (ZOCOR) 20 MG tablet Take 1 tablet (20 mg total) by mouth daily. 04/17/21 07/16/21  Horald Pollen, MD  valACYclovir (VALTREX) 1000 MG tablet Take 1 tablet (1,000 mg total) by mouth 2 (two) times daily. 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   History of gastroesophageal reflux (GERD) 09/25/2017   Osteoarthritis of multiple joints 03/24/2017   Chronic pain of left knee 03/24/2017   Chronic obstructive pulmonary disease ([place]) 03/24/2017   Primary cancer of skin  of left foot 02/03/2017   Mild intermittent asthma without complication 14/78/2956   Sleep apnea 02/03/2017   Dyslipidemia 02/03/2017   Migraine 02/03/2017   Essential hypertension 09/18/2008    Past Medical History:  Diagnosis Date   Allergy    Arthritis    Asthma    Blood transfusion without reported diagnosis    Cataract    Hyperlipidemia    Hypertension    Migraine    Osteoporosis    Sleep apnea     Past Surgical History:  Procedure Laterality Date   ABDOMINAL HYSTERECTOMY     CHOLECYSTECTOMY     EYE SURGERY Left    cataract    FOOT SURGERY Left     Social History   Socioeconomic History   Marital status: Widowed    Spouse name: Not on file   Number of children: Not on file   Years of education: Not on file   Highest education level: Not on file  Occupational History   Not on file  Tobacco Use   Smoking status: Never   Smokeless tobacco: Never  Vaping Use   Vaping Use: Never used  Substance and Sexual Activity   Alcohol use: No   Drug use: Never   Sexual activity: Not on file  Other Topics Concern   Not on file  Social History Narrative   Not on file   Social Determinants of Health   Financial Resource Strain: Not on file  Food Insecurity: Not on file  Transportation Needs: Not on file  Physical Activity: Not on file  Stress: Not on file  Social Connections: Not on file  Intimate Partner Violence: Not on file    Family History  Problem Relation Age of Onset   Heart disease Mother    Hyperlipidemia Mother    Hypertension Mother    Stroke Mother    Heart disease Father    Stroke Father    Hypertension Son    Hyperlipidemia Son    Fibromyalgia Son    Lupus Son    Hypertension Son    Diabetes Daughter    Hypertension Daughter      Review of Systems  Constitutional: Negative.  Negative for chills and fever.  HENT: Negative.  Negative for congestion and sore throat.   Respiratory:  Positive for cough.   Cardiovascular: Negative.   Negative for chest pain and palpitations.  Gastrointestinal: Negative.  Negative for abdominal pain, diarrhea, nausea and vomiting.  Genitourinary: Negative.  Negative for dysuria and hematuria.  Musculoskeletal:        Bilateral foot pain  Skin: Negative.  Negative for rash.  Neurological: Negative.  Negative for dizziness and headaches.  All other systems reviewed and are negative.  Today's Vitals   08/05/21 1004 08/05/21 1009 08/05/21 1036  BP: (!) 150/72 (!) 146/70 130/70  Pulse: 91    Temp: 98.4 F (36.9 C)    TempSrc: Oral    SpO2: 96%    Weight: 169 lb 6 oz (76.8 kg)  Height: '5\' 1"'$  (1.549 m)     Body mass index is 32 kg/m.  Physical Exam Vitals reviewed.  Constitutional:      Appearance: Normal appearance.  HENT:     Head: Normocephalic.     Mouth/Throat:     Mouth: Mucous membranes are moist.     Pharynx: Oropharynx is clear.  Eyes:     Extraocular Movements: Extraocular movements intact.     Pupils: Pupils are equal, round, and reactive to light.  Cardiovascular:     Rate and Rhythm: Normal rate and regular rhythm.     Pulses: Normal pulses.     Heart sounds: Normal heart sounds.  Pulmonary:     Effort: Pulmonary effort is normal.     Breath sounds: Normal breath sounds.  Musculoskeletal:     Cervical back: No tenderness.     Comments: Feet: Warm to touch.  No erythema or ecchymosis.  No skin breakdowns.  Excellent peripheral pulses.  Excellent capillary refill.  Sensation intact. Full range of motion at the toes and ankles. Old surgical scar on left foot from tumor surgery in the past.  Lymphadenopathy:     Cervical: No cervical adenopathy.  Skin:    General: Skin is warm and dry.     Capillary Refill: Capillary refill takes less than 2 seconds.  Neurological:     General: No focal deficit present.     Mental Status: She is alert and oriented to person, place, and time.  Psychiatric:        Mood and Affect: Mood normal.        Behavior: Behavior  normal.     ASSESSMENT & PLAN: A total of 50-minutes was spent with the patient and counseling/coordination of care regarding preparing for this visit, review of most recent office visit notes, review of most recent blood work results, review of multiple chronic medical problems and their management, review of all medications, education on nutrition, cardiovascular risks associated with hypertension and dyslipidemia, prognosis, review of health maintenance items, and need for mammogram, prognosis, documentation, need for follow-up  Problem List Items Addressed This Visit       Cardiovascular and Mediastinum   Essential hypertension - Primary    Elevated blood pressure reading in the office with normal readings at home.  Advised to continue monitoring blood pressure readings at home daily for the next several weeks and keep a log. Continue Cardizem 240 mg daily which she has been taking for over 20 years. Dietary approaches to stop hypertension discussed.       Relevant Medications   diltiazem (CARDIZEM CD) 240 MG 24 hr capsule   simvastatin (ZOCOR) 20 MG tablet     Respiratory   Chronic obstructive pulmonary disease (HCC)    Well-controlled without recent exacerbations. Continue daily Trelegy and albuterol as needed.       Relevant Medications   cetirizine (ZYRTEC) 10 MG tablet     Other   Dyslipidemia    Stable.  Diet and nutrition discussed. Continue simvastatin 20 mg daily. Cardiovascular risk associated with dyslipidemia and hypertension discussed.       Relevant Medications   simvastatin (ZOCOR) 20 MG tablet   History of gastroesophageal reflux (GERD)    Well-controlled on omeprazole 40 mg daily.       Relevant Medications   omeprazole (PRILOSEC) 40 MG capsule   Seasonal allergies    Currently active.  Continue Singulair 10 mg and Zyrtec 10 mg daily as needed.  Relevant Medications   cetirizine (ZYRTEC) 10 MG tablet   Bilateral foot pain    Has  history of primary cancer on the left foot.  Has seen podiatrist in the past.  Encouraged to follow-up with them.  Patient states Motrin works well for her. Prescription for 600 mg Motrin sent to pharmacy of record.       Other Visit Diagnoses     Encounter for screening mammogram for malignant neoplasm of breast       Relevant Orders   MM Digital Screening      Patient Instructions  Hypertension, Adult High blood pressure (hypertension) is when the force of blood pumping through the arteries is too strong. The arteries are the blood vessels that carry blood from the heart throughout the body. Hypertension forces the heart to work harder to pump blood and may cause arteries to become narrow or stiff. Untreated or uncontrolled hypertension can lead to a heart attack, heart failure, a stroke, kidney disease, and other problems. A blood pressure reading consists of a higher number over a lower number. Ideally, your blood pressure should be below 120/80. The first ("top") number is called the systolic pressure. It is a measure of the pressure in your arteries as your heart beats. The second ("bottom") number is called the diastolic pressure. It is a measure of the pressure in your arteries as the heart relaxes. What are the causes? The exact cause of this condition is not known. There are some conditions that result in high blood pressure. What increases the risk? Certain factors may make you more likely to develop high blood pressure. Some of these risk factors are under your control, including: Smoking. Not getting enough exercise or physical activity. Being overweight. Having too much fat, sugar, calories, or salt (sodium) in your diet. Drinking too much alcohol. Other risk factors include: Having a personal history of heart disease, diabetes, high cholesterol, or kidney disease. Stress. Having a family history of high blood pressure and high cholesterol. Having obstructive sleep  apnea. Age. The risk increases with age. What are the signs or symptoms? High blood pressure may not cause symptoms. Very high blood pressure (hypertensive crisis) may cause: Headache. Fast or irregular heartbeats (palpitations). Shortness of breath. Nosebleed. Nausea and vomiting. Vision changes. Severe chest pain, dizziness, and seizures. How is this diagnosed? This condition is diagnosed by measuring your blood pressure while you are seated, with your arm resting on a flat surface, your legs uncrossed, and your feet flat on the floor. The cuff of the blood pressure monitor will be placed directly against the skin of your upper arm at the level of your heart. Blood pressure should be measured at least twice using the same arm. Certain conditions can cause a difference in blood pressure between your right and left arms. If you have a high blood pressure reading during one visit or you have normal blood pressure with other risk factors, you may be asked to: Return on a different day to have your blood pressure checked again. Monitor your blood pressure at home for 1 week or longer. If you are diagnosed with hypertension, you may have other blood or imaging tests to help your health care provider understand your overall risk for other conditions. How is this treated? This condition is treated by making healthy lifestyle changes, such as eating healthy foods, exercising more, and reducing your alcohol intake. You may be referred for counseling on a healthy diet and physical activity. Your health care  provider may prescribe medicine if lifestyle changes are not enough to get your blood pressure under control and if: Your systolic blood pressure is above 130. Your diastolic blood pressure is above 80. Your personal target blood pressure may vary depending on your medical conditions, your age, and other factors. Follow these instructions at home: Eating and drinking  Eat a diet that is high in  fiber and potassium, and low in sodium, added sugar, and fat. An example of this eating plan is called the DASH diet. DASH stands for Dietary Approaches to Stop Hypertension. To eat this way: Eat plenty of fresh fruits and vegetables. Try to fill one half of your plate at each meal with fruits and vegetables. Eat whole grains, such as whole-wheat pasta, brown rice, or whole-grain bread. Fill about one fourth of your plate with whole grains. Eat or drink low-fat dairy products, such as skim milk or low-fat yogurt. Avoid fatty cuts of meat, processed or cured meats, and poultry with skin. Fill about one fourth of your plate with lean proteins, such as fish, chicken without skin, beans, eggs, or tofu. Avoid pre-made and processed foods. These tend to be higher in sodium, added sugar, and fat. Reduce your daily sodium intake. Many people with hypertension should eat less than 1,500 mg of sodium a day. Do not drink alcohol if: Your health care provider tells you not to drink. You are pregnant, may be pregnant, or are planning to become pregnant. If you drink alcohol: Limit how much you have to: 0-1 drink a day for women. 0-2 drinks a day for men. Know how much alcohol is in your drink. In the [place] one drink equals one 12 oz bottle of beer (355 mL), one 5 oz glass of wine (148 mL), or one 1 oz glass of hard liquor (44 mL). Lifestyle  Work with your health care provider to maintain a healthy body weight or to lose weight. Ask what an ideal weight is for you. Get at least 30 minutes of exercise that causes your heart to beat faster (aerobic exercise) most days of the week. Activities may include walking, swimming, or biking. Include exercise to strengthen your muscles (resistance exercise), such as Pilates or lifting weights, as part of your weekly exercise routine. Try to do these types of exercises for 30 minutes at least 3 days a week. Do not use any products that contain nicotine or tobacco. These  products include cigarettes, chewing tobacco, and vaping devices, such as e-cigarettes. If you need help quitting, ask your health care provider. Monitor your blood pressure at home as told by your health care provider. Keep all follow-up visits. This is important. Medicines Take over-the-counter and prescription medicines only as told by your health care provider. Follow directions carefully. Blood pressure medicines must be taken as prescribed. Do not skip doses of blood pressure medicine. Doing this puts you at risk for problems and can make the medicine less effective. Ask your health care provider about side effects or reactions to medicines that you should watch for. Contact a health care provider if you: Think you are having a reaction to a medicine you are taking. Have headaches that keep coming back (recurring). Feel dizzy. Have swelling in your ankles. Have trouble with your vision. Get help right away if you: Develop a severe headache or confusion. Have unusual weakness or numbness. Feel faint. Have severe pain in your chest or abdomen. Vomit repeatedly. Have trouble breathing. These symptoms may be an emergency. Get  help right away. Call 911. Do not wait to see if the symptoms will go away. Do not drive yourself to the hospital. Summary Hypertension is when the force of blood pumping through your arteries is too strong. If this condition is not controlled, it may put you at risk for serious complications. Your personal target blood pressure may vary depending on your medical conditions, your age, and other factors. For most people, a normal blood pressure is less than 120/80. Hypertension is treated with lifestyle changes, medicines, or a combination of both. Lifestyle changes include losing weight, eating a healthy, low-sodium diet, exercising more, and limiting alcohol. This information is not intended to replace advice given to you by your health care provider. Make sure you  discuss any questions you have with your health care provider. Document Revised: 12/25/2020 Document Reviewed: 12/25/2020 Elsevier Patient Education  [place], MD [facility]

## 2021-08-19 ENCOUNTER — Telehealth: Payer: Self-pay | Admitting: Emergency Medicine

## 2021-08-19 DIAGNOSIS — J449 Chronic obstructive pulmonary disease, unspecified: Secondary | ICD-10-CM

## 2021-08-19 MED ORDER — ALBUTEROL SULFATE HFA 108 (90 BASE) MCG/ACT IN AERS: 2.0000 | INHALATION_SPRAY | RESPIRATORY_TRACT | 3 refills | Status: DC | PRN

## 2021-08-19 NOTE — Telephone Encounter (Signed)
Dana Daniels from [facility] call stating medication request for albuterol (VENTOLIN HFA) 108 (90 Base) MCG/ACT inhaler requires PA.   [name] ([address]), [place] [address] Phone:  [phone]  Fax:  [phone]

## 2021-08-21 ENCOUNTER — Encounter: Payer: Self-pay | Admitting: *Deleted

## 2021-08-23 ENCOUNTER — Other Ambulatory Visit: Payer: Self-pay | Admitting: Emergency Medicine

## 2021-08-23 MED ORDER — ALBUTEROL SULFATE HFA 108 (90 BASE) MCG/ACT IN AERS: 2.0000 | INHALATION_SPRAY | Freq: Four times a day (QID) | RESPIRATORY_TRACT | 3 refills | Status: DC | PRN

## 2021-08-23 NOTE — Telephone Encounter (Signed)
Patient wrote back in spanish don't understand. Can you send him a msg we got a PA for his Albuterol 108%, but when we tried to contact insurance it states " Cannot find matching patient with Name and Date of Birth provided. For additional information, please contact the phone number on the back of the member prescription ID card." If you are able to verify your insurance. You can take a picture of your insurance and send it to [place]. Also make sure the pharmacy running the right insurance...Raechel Chute

## 2021-08-28 ENCOUNTER — Ambulatory Visit: Payer: 59 | Admitting: Emergency Medicine

## 2021-08-29 NOTE — Telephone Encounter (Signed)
Called insurance spoke w/ several agents. Last spoke w/ Lovena Le she states pt has two last name and its hard to pull pt up. She inform that the Albuterol Sulfate HFA inhalation aerosol was covered by pt plan. Pharmacy was processing Proventil. Pt has pickup Albuterol Sulfate HFA 08/23/21...Johny Chess

## 2021-12-10 ENCOUNTER — Telehealth: Payer: Self-pay | Admitting: Emergency Medicine

## 2021-12-10 MED ORDER — ALBUTEROL SULFATE HFA 108 (90 BASE) MCG/ACT IN AERS: 2.0000 | INHALATION_SPRAY | Freq: Four times a day (QID) | RESPIRATORY_TRACT | 3 refills | Status: DC | PRN

## 2021-12-10 MED ORDER — MONTELUKAST SODIUM 10 MG PO TABS
10.0000 mg | ORAL_TABLET | Freq: Every day | ORAL | 1 refills | Status: DC
Start: 2021-12-10 — End: 2022-05-15

## 2021-12-10 NOTE — Telephone Encounter (Signed)
Patient called stated that she needs a refill for albuterol (VENTOLIN HFA) 108 (90 Base) MCG/ACT inhaler and montelukast (SINGULAIR) 10 MG table,   Her pharmacy is [facility] [phone].

## 2021-12-10 NOTE — Telephone Encounter (Signed)
New medication sent to her requested pharmacy

## 2021-12-19 ENCOUNTER — Ambulatory Visit (INDEPENDENT_AMBULATORY_CARE_PROVIDER_SITE_OTHER): Payer: 59 | Admitting: Emergency Medicine

## 2021-12-19 ENCOUNTER — Encounter: Payer: Self-pay | Admitting: Emergency Medicine

## 2021-12-19 VITALS — BP 130/70 | HR 76 | Temp 98.5°F | Ht 61.0 in | Wt 165.0 lb

## 2021-12-19 DIAGNOSIS — G43709 Chronic migraine without aura, not intractable, without status migrainosus: Secondary | ICD-10-CM | POA: Diagnosis not present

## 2021-12-19 DIAGNOSIS — I10 Essential (primary) hypertension: Secondary | ICD-10-CM | POA: Diagnosis not present

## 2021-12-19 DIAGNOSIS — J449 Chronic obstructive pulmonary disease, unspecified: Secondary | ICD-10-CM | POA: Diagnosis not present

## 2021-12-19 DIAGNOSIS — J302 Other seasonal allergic rhinitis: Secondary | ICD-10-CM

## 2021-12-19 DIAGNOSIS — Z1231 Encounter for screening mammogram for malignant neoplasm of breast: Secondary | ICD-10-CM

## 2021-12-19 DIAGNOSIS — E785 Hyperlipidemia, unspecified: Secondary | ICD-10-CM

## 2021-12-19 DIAGNOSIS — M159 Polyosteoarthritis, unspecified: Secondary | ICD-10-CM

## 2021-12-19 DIAGNOSIS — Z8719 Personal history of other diseases of the digestive system: Secondary | ICD-10-CM

## 2021-12-19 MED ORDER — CETIRIZINE HCL 10 MG PO TABS: 10.0000 mg | ORAL_TABLET | Freq: Every day | ORAL | 11 refills | Status: DC

## 2021-12-19 MED ORDER — ALBUTEROL SULFATE HFA 108 (90 BASE) MCG/ACT IN AERS: 2.0000 | INHALATION_SPRAY | Freq: Four times a day (QID) | RESPIRATORY_TRACT | 3 refills | Status: DC | PRN

## 2021-12-19 NOTE — Assessment & Plan Note (Signed)
Stable and well-controlled.  Tylenol helps.

## 2021-12-19 NOTE — Assessment & Plan Note (Addendum)
Stable.  Takes Zyrtec 10 mg as needed. Singulair 10 mg daily

## 2021-12-19 NOTE — Assessment & Plan Note (Addendum)
Well-controlled hypertension. Continue diltiazem 240 mg daily. BP Readings from Last 3 Encounters:  12/19/21 130/70  08/05/21 130/70  04/17/21 130/70

## 2021-12-19 NOTE — Patient Instructions (Signed)
Health Maintenance After Age 74 After age 74, you are at a higher risk for certain long-term diseases and infections as well as injuries from falls. Falls are a major cause of broken bones and head injuries in people who are older than age 74. Getting regular preventive care can help to keep you healthy and well. Preventive care includes getting regular testing and making lifestyle changes as recommended by your health care provider. Talk with your health care provider about: Which screenings and tests you should have. A screening is a test that checks for a disease when you have no symptoms. A diet and exercise plan that is right for you. What should I know about screenings and tests to prevent falls? Screening and testing are the best ways to find a health problem early. Early diagnosis and treatment give you the best chance of managing medical conditions that are common after age 74. Certain conditions and lifestyle choices may make you more likely to have a fall. Your health care provider may recommend: Regular vision checks. Poor vision and conditions such as cataracts can make you more likely to have a fall. If you wear glasses, make sure to get your prescription updated if your vision changes. Medicine review. Work with your health care provider to regularly review all of the medicines you are taking, including over-the-counter medicines. Ask your health care provider about any side effects that may make you more likely to have a fall. Tell your health care provider if any medicines that you take make you feel dizzy or sleepy. Strength and balance checks. Your health care provider may recommend certain tests to check your strength and balance while standing, walking, or changing positions. Foot health exam. Foot pain and numbness, as well as not wearing proper footwear, can make you more likely to have a fall. Screenings, including: Osteoporosis screening. Osteoporosis is a condition that causes  the bones to get weaker and break more easily. Blood pressure screening. Blood pressure changes and medicines to control blood pressure can make you feel dizzy. Depression screening. You may be more likely to have a fall if you have a fear of falling, feel depressed, or feel unable to do activities that you used to do. Alcohol use screening. Using too much alcohol can affect your balance and may make you more likely to have a fall. Follow these instructions at home: Lifestyle Do not drink alcohol if: Your health care provider tells you not to drink. If you drink alcohol: Limit how much you have to: 0-1 drink a day for women. 0-2 drinks a day for men. Know how much alcohol is in your drink. In the [place] one drink equals one 12 oz bottle of beer (355 mL), one 5 oz glass of wine (148 mL), or one 1 oz glass of hard liquor (44 mL). Do not use any products that contain nicotine or tobacco. These products include cigarettes, chewing tobacco, and vaping devices, such as e-cigarettes. If you need help quitting, ask your health care provider. Activity  Follow a regular exercise program to stay fit. This will help you maintain your balance. Ask your health care provider what types of exercise are appropriate for you. If you need a cane or walker, use it as recommended by your health care provider. Wear supportive shoes that have nonskid soles. Safety  Remove any tripping hazards, such as rugs, cords, and clutter. Install safety equipment such as grab bars in bathrooms and safety rails on stairs. Keep rooms and walkways   well-lit. General instructions Talk with your health care provider about your risks for falling. Tell your health care provider if: You fall. Be sure to tell your health care provider about all falls, even ones that seem minor. You feel dizzy, tiredness (fatigue), or off-balance. Take over-the-counter and prescription medicines only as told by your health care provider. These include  supplements. Eat a healthy diet and maintain a healthy weight. A healthy diet includes low-fat dairy products, low-fat (lean) meats, and fiber from whole grains, beans, and lots of fruits and vegetables. Stay current with your vaccines. Schedule regular health, dental, and eye exams. Summary Having a healthy lifestyle and getting preventive care can help to protect your health and wellness after age 74. Screening and testing are the best way to find a health problem early and help you avoid having a fall. Early diagnosis and treatment give you the best chance for managing medical conditions that are more common for people who are older than age 74. Falls are a major cause of broken bones and head injuries in people who are older than age 74. Take precautions to prevent a fall at home. Work with your health care provider to learn what changes you can make to improve your health and wellness and to prevent falls. This information is not intended to replace advice given to you by your health care provider. Make sure you discuss any questions you have with your health care provider. Document Revised: 07/09/2020 Document Reviewed: 07/09/2020 Elsevier Patient Education  2023 [employer]vier Inc[employer]  

## 2021-12-19 NOTE — Assessment & Plan Note (Signed)
Well-controlled.  Dana Daniels working very well for her.

## 2021-12-19 NOTE — Assessment & Plan Note (Signed)
Stable.  Diet and nutrition discussed.  Continue simvastatin 20 mg daily. 

## 2021-12-19 NOTE — Assessment & Plan Note (Signed)
Well-controlled with Trelegy Had to use albuterol more often during COVID infection last summer but no recent use.

## 2021-12-19 NOTE — Assessment & Plan Note (Signed)
Stable.  Occasionally takes omeprazole 40 mg

## 2021-12-19 NOTE — Progress Notes (Signed)
Dana Daniels 76 y.o.   Chief Complaint  Patient presents with   Follow-up    F/u appt, medication refill, no concerns     HISTORY OF PRESENT ILLNESS: This is a 76 y.o. female here for 30-monthfollow-up of chronic medical problems and also medication refills. Had COVID infection last July.  Recovering well.  Still has some residual cough. No other complaints or medical concerns today.  HPI   Prior to Admission medications   Medication Sig Start Date End Date Taking? Authorizing Provider  albuterol (PROVENTIL) (2.5 MG/3ML) 0.083% nebulizer solution Take 3 mLs (2.5 mg total) by nebulization every 6 (six) hours as needed for wheezing or shortness of breath. 04/17/21  Yes Marcelia Petersen, MInes Bloomer MD  albuterol (VENTOLIN HFA) 108 (90 Base) MCG/ACT inhaler Inhale 2 puffs into the lungs every 6 (six) hours as needed for wheezing or shortness of breath. 12/10/21  Yes Doyce Saling, MInes Bloomer MD  cromolyn (NASALCROM) 5.2 MG/ACT nasal spray Place 1 spray into both nostrils 2 (two) times daily. 01/13/19  Yes Kelton Bultman, MInes Bloomer MD  diltiazem (CARDIZEM CD) 240 MG 24 hr capsule Take 1 capsule (240 mg total) by mouth daily. 08/05/21 07/31/22 Yes Dantrell Schertzer, MInes Bloomer MD  Fluticasone-Umeclidin-Vilant (TRELEGY ELLIPTA) 100-62.5-25 MCG/ACT AEPB Inhale 1 puff into the lungs daily. 12/27/20  Yes Mazey Mantell, MInes Bloomer MD  montelukast (SINGULAIR) 10 MG tablet Take 1 tablet (10 mg total) by mouth at bedtime. 12/10/21 03/10/22 Yes Zamari Vea, MInes Bloomer MD  nitroGLYCERIN (NITROSTAT) 0.4 MG SL tablet DISSOLVE ONE TABLET UNDER THE TONGUE EVERY 5 MINUTES AS NEEDED FOR CHEST PAIN.  DO NOT EXCEED A TOTAL OF 3 DOSES IN 15 MINUTES 02/08/21  Yes Terri Malerba, MInes Bloomer MD  omeprazole (PRILOSEC) 40 MG capsule Take 1 capsule (40 mg total) by mouth daily. 08/05/21 07/31/22 Yes Mayme Profeta, MInes Bloomer MD  simvastatin (ZOCOR) 20 MG tablet Take 1 tablet (20 mg total) by mouth daily. 08/05/21 07/31/22 Yes Jailyne Chieffo, MInes Bloomer  MD  Ubrogepant (UBRELVY) 100 MG TABS Take 100 mg by mouth daily as needed. Take at onset of migraine headache 04/17/21  Yes Shelly Shoultz, MInes Bloomer MD  cetirizine (ZYRTEC) 10 MG tablet Take 1 tablet (10 mg total) by mouth daily. 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 ([facility] 07/24/2020   Seasonal allergies 03/31/2018   History of gastroesophageal reflux (GERD) 09/25/2017   Osteoarthritis of multiple joints 03/24/2017   Chronic pain of left knee 03/24/2017   [place] obstructive pulmonary disease [place] 03/24/2017   Primary cancer of skin of left foot 02/03/2017   Mild intermittent asthma without complication 124/40/1027  Sleep apnea 02/03/2017   Dyslipidemia 02/03/2017   Migraine 02/03/2017   Essential hypertension 09/18/2008    Past Medical History:  Diagnosis Date   Allergy    Arthritis    Asthma    Blood transfusion without reported diagnosis    Cataract    Hyperlipidemia    Hypertension    Migraine    Osteoporosis    Sleep apnea     Past Surgical History:  Procedure Laterality Date   ABDOMINAL HYSTERECTOMY     CHOLECYSTECTOMY     EYE SURGERY Left    cataract    FOOT SURGERY Left     Social History   Socioeconomic  History   Marital status: Widowed    Spouse name: Not on file   Number of children: Not on file   Years of education: Not on file   Highest education level: Not on file  Occupational History   Not on file  Tobacco Use   Smoking status: Never   Smokeless tobacco: Never  Vaping Use   Vaping Use: Never used  Substance and Sexual Activity   Alcohol use: No   Drug use: Never   Sexual activity: Not on file  Other Topics Concern   Not on file  Social History Narrative   Not on file   Social Determinants of Health   Financial Resource Strain: Not  on file  Food Insecurity: Not on file  Transportation Needs: Not on file  Physical Activity: Not on file  Stress: Not on file  Social Connections: Not on file  Intimate Partner Violence: Not on file    Family History  Problem Relation Age of Onset   Heart disease Mother    Hyperlipidemia Mother    Hypertension Mother    Stroke Mother    Heart disease Father    Stroke Father    Hypertension Son    Hyperlipidemia Son    Fibromyalgia Son    Lupus Son    Hypertension Son    Diabetes Daughter    Hypertension Daughter      Review of Systems  Constitutional: Negative.  Negative for chills and fever.  HENT: Negative.  Negative for congestion and sore throat.   Respiratory:  Positive for cough.   Cardiovascular: Negative.  Negative for chest pain and palpitations.  Gastrointestinal:  Negative for abdominal pain, diarrhea, nausea and vomiting.  Genitourinary: Negative.  Negative for dysuria and hematuria.  Musculoskeletal: Negative.   Skin: Negative.  Negative for rash.  Neurological: Negative.  Negative for dizziness and headaches.  All other systems reviewed and are negative.   Today's Vitals   12/19/21 1441  BP: 138/88  Pulse: 76  Temp: 98.5 F (36.9 C)  TempSrc: Oral  SpO2: 96%  Weight: 165 lb (74.8 kg)  Height: '5\' 1"'$  (1.549 m)   Body mass index is 31.18 kg/m. Wt Readings from Last 3 Encounters:  12/19/21 165 lb (74.8 kg)  08/05/21 169 lb 6 oz (76.8 kg)  04/17/21 166 lb (75.3 kg)    Physical Exam Vitals reviewed.  Constitutional:      Appearance: Normal appearance.  HENT:     Head: Normocephalic.     Mouth/Throat:     Mouth: Mucous membranes are moist.     Pharynx: Oropharynx is clear.  Eyes:     Extraocular Movements: Extraocular movements intact.     Conjunctiva/sclera: Conjunctivae normal.     Pupils: Pupils are equal, round, and reactive to light.  Cardiovascular:     Rate and Rhythm: Normal rate and regular rhythm.     Pulses: Normal pulses.      Heart sounds: Normal heart sounds.  Pulmonary:     Effort: Pulmonary effort is normal.     Breath sounds: Normal breath sounds.  Musculoskeletal:        General: Deformity (Osteoarthritic deformities in hands and fingers) present. Normal range of motion.     Cervical back: No tenderness.  Lymphadenopathy:     Cervical: No cervical adenopathy.  Skin:    General: Skin is warm and dry.     Capillary Refill: Capillary refill takes less than 2 seconds.  Neurological:  General: No focal deficit present.     Mental Status: She is alert and oriented to person, place, and time.  Psychiatric:        Mood and Affect: Mood normal.        Behavior: Behavior normal.      ASSESSMENT & PLAN: A total of 48 minutes was spent with the patient and counseling/coordination of care regarding preparing for this visit, review of most recent office visit notes, review of most recent blood work results, review of multiple chronic medical problems and their management, review of all medications, education on nutrition, prognosis, documentation, and need for follow-up.  Problem List Items Addressed This Visit       Cardiovascular and Mediastinum   Essential hypertension - Primary    Well-controlled hypertension. Continue diltiazem 240 mg daily. BP Readings from Last 3 Encounters:  12/19/21 130/70  08/05/21 130/70  04/17/21 130/70         Migraine    Well-controlled.  Roselyn Meier working very well for her.        Respiratory   Chronic obstructive pulmonary disease ([place])    Well-controlled with Trelegy Had to use albuterol more often during COVID infection last summer but no recent use.      Relevant Medications   cetirizine (ZYRTEC) 10 MG tablet   albuterol (VENTOLIN HFA) 108 (90 Base) MCG/ACT inhaler     Musculoskeletal and Integument   Osteoarthritis of multiple joints    Stable and well-controlled.  Tylenol helps.        Other   Dyslipidemia    Stable.  Diet and nutrition  discussed. Continue simvastatin 20 mg daily.      History of gastroesophageal reflux (GERD)    Stable.  Occasionally takes omeprazole 40 mg      Seasonal allergies    Stable.  Takes Zyrtec 10 mg as needed. Singulair 10 mg daily      Relevant Medications   cetirizine (ZYRTEC) 10 MG tablet   Other Visit Diagnoses     Encounter for screening mammogram for malignant neoplasm of breast       Relevant Orders   MM Digital Screening        Patient Instructions  Health Maintenance After Age 26 After age 75, you are at a higher risk for certain long-term diseases and infections as well as injuries from falls. Falls are a major cause of broken bones and head injuries in people who are older than age 74. Getting regular preventive care can help to keep you healthy and well. Preventive care includes getting regular testing and making lifestyle changes as recommended by your health care provider. Talk with your health care provider about: Which screenings and tests you should have. A screening is a test that checks for a disease when you have no symptoms. A diet and exercise plan that is right for you. What should I know about screenings and tests to prevent falls? Screening and testing are the best ways to find a health problem early. Early diagnosis and treatment give you the best chance of managing medical conditions that are common after age 29. Certain conditions and lifestyle choices may make you more likely to have a fall. Your health care provider may recommend: Regular vision checks. Poor vision and conditions such as cataracts can make you more likely to have a fall. If you wear glasses, make sure to get your prescription updated if your vision changes. Medicine review. Work with your health care provider to regularly  review all of the medicines you are taking, including over-the-counter medicines. Ask your health care provider about any side effects that may make you more likely to  have a fall. Tell your health care provider if any medicines that you take make you feel dizzy or sleepy. Strength and balance checks. Your health care provider may recommend certain tests to check your strength and balance while standing, walking, or changing positions. Foot health exam. Foot pain and numbness, as well as not wearing proper footwear, can make you more likely to have a fall. Screenings, including: Osteoporosis screening. Osteoporosis is a condition that causes the bones to get weaker and break more easily. Blood pressure screening. Blood pressure changes and medicines to control blood pressure can make you feel dizzy. Depression screening. You may be more likely to have a fall if you have a fear of falling, feel depressed, or feel unable to do activities that you used to do. Alcohol use screening. Using too much alcohol can affect your balance and may make you more likely to have a fall. Follow these instructions at home: Lifestyle Do not drink alcohol if: Your health care provider tells you not to drink. If you drink alcohol: Limit how much you have to: 0-1 drink a day for women. 0-2 drinks a day for men. Know how much alcohol is in your drink. In the [place] one drink equals one 12 oz bottle of beer (355 mL), one 5 oz glass of wine (148 mL), or one 1 oz glass of hard liquor (44 mL). Do not use any products that contain nicotine or tobacco. These products include cigarettes, chewing tobacco, and vaping devices, such as e-cigarettes. If you need help quitting, ask your health care provider. Activity  Follow a regular exercise program to stay fit. This will help you maintain your balance. Ask your health care provider what types of exercise are appropriate for you. If you need a cane or walker, use it as recommended by your health care provider. Wear supportive shoes that have nonskid soles. Safety  Remove any tripping hazards, such as rugs, cords, and clutter. Install safety  equipment such as grab bars in bathrooms and safety rails on stairs. Keep rooms and walkways well-lit. General instructions Talk with your health care provider about your risks for falling. Tell your health care provider if: You fall. Be sure to tell your health care provider about all falls, even ones that seem minor. You feel dizzy, tiredness (fatigue), or off-balance. Take over-the-counter and prescription medicines only as told by your health care provider. These include supplements. Eat a healthy diet and maintain a healthy weight. A healthy diet includes low-fat dairy products, low-fat (lean) meats, and fiber from whole grains, beans, and lots of fruits and vegetables. Stay current with your vaccines. Schedule regular health, dental, and eye exams. Summary Having a healthy lifestyle and getting preventive care can help to protect your health and wellness after age 83. Screening and testing are the best way to find a health problem early and help you avoid having a fall. Early diagnosis and treatment give you the best chance for managing medical conditions that are more common for people who are older than age 19. Falls are a major cause of broken bones and head injuries in people who are older than age 70. Take precautions to prevent a fall at home. Work with your health care provider to learn what changes you can make to improve your health and wellness and to prevent falls.  This information is not intended to replace advice given to you by your health care provider. Make sure you discuss any questions you have with your health care provider. Document Revised: 07/09/2020 Document Reviewed: 07/09/2020 Elsevier Patient Education  [place], MD [facility]

## 2022-01-10 ENCOUNTER — Other Ambulatory Visit: Payer: Self-pay | Admitting: Emergency Medicine

## 2022-01-10 DIAGNOSIS — J449 Chronic obstructive pulmonary disease, unspecified: Secondary | ICD-10-CM

## 2022-01-30 ENCOUNTER — Other Ambulatory Visit: Payer: Self-pay | Admitting: Emergency Medicine

## 2022-01-30 DIAGNOSIS — J449 Chronic obstructive pulmonary disease, unspecified: Secondary | ICD-10-CM

## 2022-01-30 MED ORDER — TRELEGY ELLIPTA 100-62.5-25 MCG/ACT IN AEPB: INHALATION_SPRAY | RESPIRATORY_TRACT | 1 refills | Status: DC

## 2022-02-10 ENCOUNTER — Ambulatory Visit
Admission: RE | Admit: 2022-02-10 | Discharge: 2022-02-10 | Disposition: A | Discharge status: Home / Self Care | Payer: 59 | Source: Ambulatory Visit | Attending: Emergency Medicine | Admitting: Emergency Medicine

## 2022-02-10 DIAGNOSIS — Z1231 Encounter for screening mammogram for malignant neoplasm of breast: Secondary | ICD-10-CM

## 2022-02-27 ENCOUNTER — Telehealth: Payer: Self-pay | Admitting: Emergency Medicine

## 2022-02-27 NOTE — Telephone Encounter (Signed)
Disability parking placard signed by provider will notify pt when ready.

## 2022-02-27 NOTE — Telephone Encounter (Signed)
PT has turned in completed disability parking placard form and it is currently in [place].  PT would like to be notified once this is finished!  CB: [phone]

## 2022-02-27 NOTE — Telephone Encounter (Signed)
PT visits today requesting a copy of the handicap placard renewal forms.   While here she had also noted that she was having issues with her CPAP. PT states that once she tries to put the CPAP on it just turns off. She has tried to clean it multiple times and adjust all that she could on her end. Would an order for a new CPAP be possible or what are her current options?  CB: [phone]

## 2022-02-28 NOTE — Telephone Encounter (Signed)
Called patient and informed her that her handicap placard is ready for pick up . Patient will come by office to get. Form is upfront at desk

## 2022-02-28 NOTE — Telephone Encounter (Signed)
Called patient and left message in reference to her CPAP issue. Patient will need to call [facility] to request a service order. A respiratory therapist will come out to her home to service the CPAP machine

## 2022-03-10 ENCOUNTER — Encounter: Payer: Self-pay | Admitting: Emergency Medicine

## 2022-03-10 NOTE — Telephone Encounter (Signed)
Has to go through the doctor who prescribed CPAP machine in the first place

## 2022-03-29 ENCOUNTER — Other Ambulatory Visit: Payer: Self-pay | Admitting: Emergency Medicine

## 2022-03-29 DIAGNOSIS — J449 Chronic obstructive pulmonary disease, unspecified: Secondary | ICD-10-CM

## 2022-04-25 ENCOUNTER — Other Ambulatory Visit: Payer: Self-pay | Admitting: Emergency Medicine

## 2022-04-25 DIAGNOSIS — J449 Chronic obstructive pulmonary disease, unspecified: Secondary | ICD-10-CM

## 2022-05-09 ENCOUNTER — Encounter: Payer: Self-pay | Admitting: Emergency Medicine

## 2022-05-15 ENCOUNTER — Ambulatory Visit (INDEPENDENT_AMBULATORY_CARE_PROVIDER_SITE_OTHER): Payer: 59 | Admitting: Emergency Medicine

## 2022-05-15 ENCOUNTER — Encounter: Payer: Self-pay | Admitting: Emergency Medicine

## 2022-05-15 VITALS — BP 120/64 | HR 75 | Temp 98.7°F | Ht 61.0 in | Wt 167.2 lb

## 2022-05-15 DIAGNOSIS — J302 Other seasonal allergic rhinitis: Secondary | ICD-10-CM | POA: Diagnosis not present

## 2022-05-15 DIAGNOSIS — J22 Unspecified acute lower respiratory infection: Secondary | ICD-10-CM | POA: Diagnosis not present

## 2022-05-15 DIAGNOSIS — I10 Essential (primary) hypertension: Secondary | ICD-10-CM

## 2022-05-15 DIAGNOSIS — J449 Chronic obstructive pulmonary disease, unspecified: Secondary | ICD-10-CM

## 2022-05-15 DIAGNOSIS — R051 Acute cough: Secondary | ICD-10-CM | POA: Diagnosis not present

## 2022-05-15 LAB — COMPREHENSIVE METABOLIC PANEL
ALT: 15 U/L (ref 0–35)
AST: 13 U/L (ref 0–37)
Albumin: 4.1 g/dL (ref 3.5–5.2)
Alkaline Phosphatase: 89 U/L (ref 39–117)
BUN: 10 mg/dL (ref 6–23)
CO2: 29 mEq/L (ref 19–32)
Calcium: 9.5 mg/dL (ref 8.4–10.5)
Chloride: 104 mEq/L (ref 96–112)
Creatinine, Ser: 0.79 mg/dL (ref 0.40–1.20)
GFR: 73.55 mL/min (ref >60.00)
Glucose, Bld: 109 mg/dL — ABNORMAL HIGH (ref 70–99)
Potassium: 4 mEq/L (ref 3.5–5.1)
Sodium: 142 mEq/L (ref 135–145)
Total Bilirubin: 0.3 mg/dL (ref 0.2–1.2)
Total Protein: 7.1 g/dL (ref 6.0–8.3)

## 2022-05-15 LAB — VITAMIN D 25 HYDROXY (VIT D DEFICIENCY, FRACTURES): VITD: 15.05 ng/mL — ABNORMAL LOW (ref 30.00–100.00)

## 2022-05-15 LAB — CBC WITH DIFFERENTIAL/PLATELET
Basophils Absolute: 0.1 10*3/uL (ref 0.0–0.1)
Basophils Relative: 0.5 % (ref 0.0–3.0)
Eosinophils Absolute: 0.2 10*3/uL (ref 0.0–0.7)
Eosinophils Relative: 2 % (ref 0.0–5.0)
HCT: 44.2 % (ref 36.0–46.0)
Hemoglobin: 14.4 g/dL (ref 12.0–15.0)
Lymphocytes Relative: 36.2 % (ref 12.0–46.0)
Lymphs Abs: 3.6 10*3/uL (ref 0.7–4.0)
MCHC: 32.5 g/dL (ref 30.0–36.0)
MCV: 84.3 fl (ref 78.0–100.0)
Monocytes Absolute: 0.8 10*3/uL (ref 0.1–1.0)
Monocytes Relative: 7.5 % (ref 3.0–12.0)
Neutro Abs: 5.4 10*3/uL (ref 1.4–7.7)
Neutrophils Relative %: 53.8 % (ref 43.0–77.0)
Platelets: 285 10*3/uL (ref 150.0–400.0)
RBC: 5.24 Mil/uL — ABNORMAL HIGH (ref 3.87–5.11)
RDW: 14.2 % (ref 11.5–15.5)
WBC: 10 10*3/uL (ref 4.0–10.5)

## 2022-05-15 LAB — VITAMIN B12: Vitamin B-12: 260 pg/mL (ref 211–911)

## 2022-05-15 MED ORDER — CETIRIZINE HCL 10 MG PO TABS: 10.0000 mg | ORAL_TABLET | Freq: Every day | ORAL | 11 refills | Status: DC

## 2022-05-15 MED ORDER — PREDNISONE 20 MG PO TABS: 40.0000 mg | ORAL_TABLET | Freq: Every day | ORAL | 0 refills | Status: AC

## 2022-05-15 MED ORDER — TRELEGY ELLIPTA 100-62.5-25 MCG/ACT IN AEPB: INHALATION_SPRAY | RESPIRATORY_TRACT | 0 refills | Status: DC

## 2022-05-15 MED ORDER — AZITHROMYCIN 250 MG PO TABS: ORAL_TABLET | ORAL | 0 refills | Status: DC

## 2022-05-15 MED ORDER — HYDROCODONE BIT-HOMATROP MBR 5-1.5 MG/5ML PO SOLN: 5.0000 mL | Freq: Every evening | ORAL | 0 refills | Status: DC | PRN

## 2022-05-15 MED ORDER — MONTELUKAST SODIUM 10 MG PO TABS: 10.0000 mg | ORAL_TABLET | Freq: Every day | ORAL | 1 refills | Status: DC

## 2022-05-15 NOTE — Assessment & Plan Note (Signed)
Upper viral respiratory infection now with secondary bacterial infection.  Will benefit from daily azithromycin for 5 days. Clinically stable.  No signs of pneumonia. Advised to rest and stay well-hydrated. ED precautions given.

## 2022-05-15 NOTE — Progress Notes (Signed)
Dana Daniels 77 y.o.   Chief Complaint  Patient presents with   Cough    Cough off and on , runny nose, right ear pain  Covid neg, had fever 2 days ago     HISTORY OF PRESENT ILLNESS: This is a 77 y.o. female complaining of flulike symptoms that started about 2 weeks ago Mostly complaining of productive cough and chest congestion History of asthma.  Has been using albuterol nebulizer frequently Had fever 2 days ago.  No longer febrile.  Tested negative for COVID. No other complaints or medical concerns today.  Cough Associated symptoms include a fever, myalgias and wheezing. Pertinent negatives include no chest pain, headaches, hemoptysis, rash, sore throat or shortness of breath.     Prior to Admission medications   Medication Sig Start Date End Date Taking? Authorizing Provider  albuterol (VENTOLIN HFA) 108 (90 Base) MCG/ACT inhaler Inhale 2 puffs into the lungs every 6 (six) hours as needed for wheezing or shortness of breath. 12/19/21  Yes Liisa Picone, Ines Bloomer, MD  cromolyn (NASALCROM) 5.2 MG/ACT nasal spray Place 1 spray into both nostrils 2 (two) times daily. 01/13/19  Yes Opha Mcghee, Ines Bloomer, MD  diltiazem (CARDIZEM CD) 240 MG 24 hr capsule Take 1 capsule (240 mg total) by mouth daily. 08/05/21 07/31/22 Yes Timya Trimmer, Ines Bloomer, MD  Fluticasone-Umeclidin-Vilant [facility]) 100-62.5-25 MCG/ACT AEPB Inhale 1 puff by mouth once daily 04/26/22  Yes Yaritzel Stange, Ines Bloomer, MD  nitroGLYCERIN (NITROSTAT) 0.4 MG SL tablet DISSOLVE ONE TABLET UNDER THE TONGUE EVERY 5 MINUTES AS NEEDED FOR CHEST PAIN.  DO NOT EXCEED A TOTAL OF 3 DOSES IN 15 MINUTES 02/08/21  Yes Takirah Binford, Ines Bloomer, MD  omeprazole (PRILOSEC) 40 MG capsule Take 1 capsule (40 mg total) by mouth daily. 08/05/21 07/31/22 Yes Brixton Schnapp, Ines Bloomer, MD  simvastatin (ZOCOR) 20 MG tablet Take 1 tablet (20 mg total) by mouth daily. 08/05/21 07/31/22 Yes Joylyn Duggin, Ines Bloomer, MD  Ubrogepant (UBRELVY) 100 MG TABS Take  100 mg by mouth daily as needed. Take at onset of migraine headache 04/17/21  Yes Bria Sparr, Ines Bloomer, MD  cetirizine (ZYRTEC) 10 MG tablet Take 1 tablet (10 mg total) by mouth daily. 12/19/21 04/18/22  Horald Pollen, MD  montelukast (SINGULAIR) 10 MG tablet Take 1 tablet (10 mg total) by mouth at bedtime. 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   Seasonal allergies 03/31/2018   History of gastroesophageal reflux (GERD) 09/25/2017   Osteoarthritis of multiple joints 03/24/2017   Chronic pain of left knee 03/24/2017   Chronic obstructive pulmonary disease ([place]) 03/24/2017   Primary cancer of skin of left foot 02/03/2017   Mild intermittent asthma without complication [id number]   Sleep apnea 02/03/2017   Dyslipidemia 02/03/2017   Migraine 02/03/2017   Essential hypertension 09/18/2008    Past Medical History:  Diagnosis Date   Allergy    Arthritis    Asthma    Blood transfusion without reported diagnosis    Cataract    Hyperlipidemia    Hypertension    Migraine    Osteoporosis    Sleep apnea     Past Surgical History:  Procedure Laterality Date   ABDOMINAL HYSTERECTOMY     CHOLECYSTECTOMY     EYE SURGERY Left  cataract    FOOT SURGERY Left     Social History   Socioeconomic History   Marital status: Widowed    Spouse name: Not on file   Number of children: Not on file   Years of education: Not on file   Highest education level: Not on file  Occupational History   Not on file  Tobacco Use   Smoking status: Never   Smokeless tobacco: Never  Vaping Use   Vaping Use: Never used  Substance and Sexual Activity   Alcohol use: No   Drug use: Never   Sexual activity: Not on file  Other Topics Concern   Not on file  Social History  Narrative   Not on file   Social Determinants of Health   Financial Resource Strain: Not on file  Food Insecurity: Not on file  Transportation Needs: Not on file  Physical Activity: Not on file  Stress: Not on file  Social Connections: Not on file  Intimate Partner Violence: Not on file    Family History  Problem Relation Age of Onset   Heart disease Mother    Hyperlipidemia Mother    Hypertension Mother    Stroke Mother    Heart disease Father    Stroke Father    Hypertension Son    Hyperlipidemia Son    Fibromyalgia Son    Lupus Son    Hypertension Son    Diabetes Daughter    Hypertension Daughter      Review of Systems  Constitutional:  Positive for fever.  HENT:  Positive for congestion. Negative for sore throat.   Respiratory:  Positive for cough, sputum production and wheezing. Negative for hemoptysis and shortness of breath.   Cardiovascular: Negative.  Negative for chest pain and palpitations.  Gastrointestinal:  Negative for abdominal pain, diarrhea, nausea and vomiting.  Musculoskeletal:  Positive for myalgias. Negative for joint pain.  Skin: Negative.  Negative for rash.  Neurological: Negative.  Negative for dizziness and headaches.  All other systems reviewed and are negative.   Vitals:   05/15/22 0834  BP: 120/64  Pulse: 75  Temp: 98.7 F (37.1 C)  SpO2: 96%    Physical Exam Vitals reviewed.  Constitutional:      Appearance: Normal appearance.  HENT:     Head: Normocephalic.     Mouth/Throat:     Mouth: Mucous membranes are moist.     Pharynx: Oropharynx is clear.  Eyes:     Extraocular Movements: Extraocular movements intact.     Conjunctiva/sclera: Conjunctivae normal.     Pupils: Pupils are equal, round, and reactive to light.  Cardiovascular:     Rate and Rhythm: Normal rate and regular rhythm.     Pulses: Normal pulses.     Heart sounds: Normal heart sounds.  Pulmonary:     Effort: Pulmonary effort is normal.     Breath  sounds: Normal breath sounds.  Abdominal:     Palpations: Abdomen is soft.     Tenderness: There is no abdominal tenderness.  Musculoskeletal:     Cervical back: No tenderness.     Right lower leg: No edema.     Left lower leg: No edema.  Lymphadenopathy:     Cervical: No cervical adenopathy.  Skin:    General: Skin is warm and dry.     Capillary Refill: Capillary refill takes less than 2 seconds.  Neurological:     General: No focal deficit present.     Mental Status:  She is alert and oriented to person, place, and time.  Psychiatric:        Mood and Affect: Mood normal.        Behavior: Behavior normal.      ASSESSMENT & PLAN: A total of 44 minutes was spent with the patient and counseling/coordination of care regarding preparing for this visit, review of most recent office visit notes, review of multiple chronic medical conditions under management, review of all medications, diagnosis of lower respiratory infection and need for antibiotics and oral corticosteroids, cough management, pain management, prognosis, documentation and need for follow-up.  Problem List Items Addressed This Visit       Cardiovascular and Mediastinum   Essential hypertension    Well-controlled hypertension. Continue Cardizem 240 mg daily. BP Readings from Last 3 Encounters:  05/15/22 120/64  12/19/21 130/70  08/05/21 130/70          Respiratory   Chronic obstructive pulmonary disease ([place])    Presently active due to infection. Will start daily azithromycin Continue daily Trelegy Start prednisone 40 mg daily for 5 days Continue albuterol nebulizers as needed      Relevant Medications   cetirizine (ZYRTEC) 10 MG tablet   montelukast (SINGULAIR) 10 MG tablet   Fluticasone-Umeclidin-Vilant (TRELEGY ELLIPTA) 100-62.5-25 MCG/ACT AEPB   azithromycin (ZITHROMAX) 250 MG tablet   predniSONE (DELTASONE) 20 MG tablet   HYDROcodone bit-homatropine (HYCODAN) 5-1.5 MG/5ML syrup   Lower respiratory  infection - Primary    Upper viral respiratory infection now with secondary bacterial infection.  Will benefit from daily azithromycin for 5 days. Clinically stable.  No signs of pneumonia. Advised to rest and stay well-hydrated. ED precautions given.      Relevant Medications   azithromycin (ZITHROMAX) 250 MG tablet   predniSONE (DELTASONE) 20 MG tablet   Other Relevant Orders   CBC with Differential/Platelet   Comprehensive metabolic panel   Vitamin 123456   VITAMIN D 25 Hydroxy (Vit-D Deficiency, Fractures)     Other   Acute cough    Cough management discussed Continue over the counter Mucinex DM and cough drops Start Hycodan syrup as needed Advised to stay well-hydrated      Relevant Medications   HYDROcodone bit-homatropine (HYCODAN) 5-1.5 MG/5ML syrup   Seasonal allergies   Relevant Medications   cetirizine (ZYRTEC) 10 MG tablet   montelukast (SINGULAIR) 10 MG tablet   Patient Instructions  Bronquitis aguda en los adultos Acute Bronchitis, Adult  La bronquitis aguda es la inflamacin repentina de las vas areas (bronquios) de los pulmones. Esta afeccin puede dificultar la respiracin. En los adultos, la bronquitis aguda generalmente desaparece en 2 semanas. La tos provocada por la bronquitis puede durar hasta 3 semanas. El hbito de fumar, las Set designer y el asma pueden empeorar esta afeccin. Cules son las causas? Los microbios que causan el resfro y la gripe (virus). La causa ms frecuente de esta afeccin es el virus que provoca el resfro comn. Bacterias. Sustancias que molestan (irritan) los pulmones, lo que incluye: Humo de cigarrillos y otros productos de tabaco. Polvo y polen. Vapores de productos qumicos, gases o combustible quemado. Contaminacin del aire interior o exterior. Qu incrementa el riesgo? El sistema de defensa del cuerpo debilitado. Este tambin se denomina sistema inmunitario. Cualquier afeccin que afecte a los pulmones y la  respiracin, como el asma. Cules son los signos o sntomas? Tos. Despedir Ardelia Mems mucosidad transparente, amarilla o verde al toser. Emitir sonidos de silbidos agudos al respirar, ms a menudo al [employer] (  sibilancias). Secrecin o congestin nasal. Exceso de mucosidad en los pulmones (congestin torcica). Falta de aire. Dolores [employer] cuerpo. Dolor de Investment banker, operational. Cmo se trata? La bronquitis aguda puede desaparecer con [employer], sin tratamiento. Su mdico puede recomendarle lo siguiente: Beba ms lquidos. Esto ayudar a diluir la mucosidad de modo que sea ms fcil expectorarla. Usar un dispositivo que Contractor en los pulmones (inhalador). Utilizar un humidificador o vaporizador. Estas son mquinas que agregan agua al aire. Esto ayuda con la tos y con la respiracin deficiente. Tomar un medicamento que diluya la mucosidad y ayude a eliminarla de los pulmones. Tomar un medicamento que prevenga o detenga la tos. No es frecuente tomar un antibitico para esta afeccin. Siga estas indicaciones en su casa:  Use los medicamentos de venta libre y los recetados solamente como se lo haya indicado el mdico. Use un Tax inspector, un humidificador o un vaporizador tal como se lo haya indicado el mdico. [employer] cucharaditas (10 ml) de miel a la hora de [place]. Esto ayuda a disminuir la tos por la noche. Beba suficiente lquido para Contractor pis (la orina) de color amarillo plido. No fume ni consuma ningn producto que contenga nicotina o tabaco. Si necesita ayuda para dejar de fumar, consulte al mdico. Descanse lo suficiente. Regrese a sus actividades normales cuando el [employer] diga que es [place]. Concurra a [place]. Cmo se previene?  Lvese las manos frecuentemente con agua y jabn durante al menos 20 segundos. Use un desinfectante para manos si no dispone de [place]. Evite el contacto con personas que tienen sntomas de resfro. Trate de no  llevarse las manos a la boca, la nariz o los ojos. Evite inhalar humo o vapores qumicos. Recuerde aplicarse la vacuna contra la gripe todos los [place]. Comunquese con un mdico si: Los sntomas no mejoran en el trmino de 2 semanas. Tiene dificultad para expulsar la mucosidad al toser. La tos lo mantiene despierto por la noche. Tiene fiebre. Solicite ayuda de inmediato si: Tose y [employer]. Siente dolor en el pecho. Sufre un episodio muy intenso de falta de aire. Se desmaya o se siente como si se fuera a desmayar. Tiene un dolor de cabeza muy intenso. La fiebre o los escalofros empeoran. Estos sntomas pueden Sales executive. Solicite ayuda de inmediato. Comunquese con el servicio de emergencias de su localidad (911 en los [place]). No espere a ver si los sntomas desaparecen. No conduzca por sus propios medios [employer]. Resumen La bronquitis aguda es la inflamacin repentina de las vas areas (bronquios) de los pulmones. En los adultos, la bronquitis aguda generalmente desaparece en 2 semanas. Beba ms lquidos. Esto ayudar a diluir la mucosidad de modo que sea ms fcil expectorarla. Use los medicamentos de venta libre y los recetados solamente como se lo haya indicado el mdico. Comunquese con un mdico si los sntomas no mejoran despus de 2 semanas de [place]. Esta informacin no tiene Marine scientist el consejo del mdico. Asegrese de hacerle al mdico cualquier pregunta que tenga. Document Revised: 07/09/2020 Document Reviewed: 07/09/2020 Elsevier Patient Education  [place], MD [facility]

## 2022-05-15 NOTE — Assessment & Plan Note (Signed)
Presently active due to infection. Will start daily azithromycin Continue daily Trelegy Start prednisone 40 mg daily for 5 days Continue albuterol nebulizers as needed

## 2022-05-15 NOTE — Assessment & Plan Note (Signed)
Well-controlled hypertension. Continue Cardizem 240 mg daily. BP Readings from Last 3 Encounters:  05/15/22 120/64  12/19/21 130/70  08/05/21 130/70

## 2022-05-15 NOTE — Assessment & Plan Note (Signed)
Cough management discussed Continue over the counter Mucinex DM and cough drops Start Hycodan syrup as needed Advised to stay well-hydrated

## 2022-05-15 NOTE — Patient Instructions (Signed)
Bronquitis aguda en los adultos Acute Bronchitis, Adult  La bronquitis aguda es la inflamacin repentina de las vas areas (bronquios) de los pulmones. Esta afeccin puede dificultar la respiracin. En los adultos, la bronquitis aguda generalmente desaparece en 2 semanas. La tos provocada por la bronquitis puede durar hasta 3 semanas. El hbito de fumar, las alergias y el asma pueden empeorar esta afeccin. Cules son las causas? Los microbios que causan el resfro y la gripe (virus). La causa ms frecuente de esta afeccin es el virus que provoca el resfro comn. Bacterias. Sustancias que molestan (irritan) los pulmones, lo que incluye: Humo de cigarrillos y otros productos de tabaco. Polvo y polen. Vapores de productos qumicos, gases o combustible quemado. Contaminacin del aire interior o exterior. Qu incrementa el riesgo? El sistema de defensa del cuerpo debilitado. Este tambin se denomina sistema inmunitario. Cualquier afeccin que afecte a los pulmones y la respiracin, como el asma. Cules son los signos o sntomas? Tos. Despedir una mucosidad transparente, amarilla o verde al toser. Emitir sonidos de silbidos agudos al respirar, ms a menudo al exhalar (sibilancias). Secrecin o congestin nasal. Exceso de mucosidad en los pulmones (congestin torcica). Falta de aire. Dolores en el cuerpo. Dolor de garganta. Cmo se trata? La bronquitis aguda puede desaparecer con el tiempo, sin tratamiento. Su mdico puede recomendarle lo siguiente: Beba ms lquidos. Esto ayudar a diluir la mucosidad de modo que sea ms fcil expectorarla. Usar un dispositivo que administra medicamento en los pulmones (inhalador). Utilizar un humidificador o vaporizador. Estas son mquinas que agregan agua al aire. Esto ayuda con la tos y con la respiracin deficiente. Tomar un medicamento que diluya la mucosidad y ayude a eliminarla de los pulmones. Tomar un medicamento que prevenga o detenga la  tos. No es frecuente tomar un antibitico para esta afeccin. Siga estas indicaciones en su casa:  Use los medicamentos de venta libre y los recetados solamente como se lo haya indicado el mdico. Use un inhalador, un humidificador o un vaporizador tal como se lo haya indicado el mdico. Tome dos cucharaditas (10 ml) de miel a la hora de acostarse. Esto ayuda a disminuir la tos por la noche. Beba suficiente lquido para mantener el pis (la orina) de color amarillo plido. No fume ni consuma ningn producto que contenga nicotina o tabaco. Si necesita ayuda para dejar de fumar, consulte al mdico. Descanse lo suficiente. Regrese a sus actividades normales cuando el mdico le diga que es seguro. Concurra a todas las visitas de seguimiento. Cmo se previene?  Lvese las manos frecuentemente con agua y jabn durante al menos 20 segundos. Use un desinfectante para manos si no dispone de agua y jabn. Evite el contacto con personas que tienen sntomas de resfro. Trate de no llevarse las manos a la boca, la nariz o los ojos. Evite inhalar humo o vapores qumicos. Recuerde aplicarse la vacuna contra la gripe todos los aos. Comunquese con un mdico si: Los sntomas no mejoran en el trmino de 2 semanas. Tiene dificultad para expulsar la mucosidad al toser. La tos lo mantiene despierto por la noche. Tiene fiebre. Solicite ayuda de inmediato si: Tose y escupe sangre. Siente dolor en el pecho. Sufre un episodio muy intenso de falta de aire. Se desmaya o se siente como si se fuera a desmayar. Tiene un dolor de cabeza muy intenso. La fiebre o los escalofros empeoran. Estos sntomas pueden indicar una emergencia. Solicite ayuda de inmediato. Comunquese con el servicio de emergencias de su localidad (911 en los   [place]). No espere a ver si los sntomas desaparecen. No conduzca por sus propios medios hasta el hospital. Resumen La bronquitis aguda es la inflamacin repentina de las vas  areas (bronquios) de los pulmones. En los adultos, la bronquitis aguda generalmente desaparece en 2 semanas. Beba ms lquidos. Esto ayudar a diluir la mucosidad de modo que sea ms fcil expectorarla. Use los medicamentos de venta libre y los recetados solamente como se lo haya indicado el mdico. Comunquese con un mdico si los sntomas no mejoran despus de 2 semanas de tratamiento. Esta informacin no tiene como fin reemplazar el consejo del mdico. Asegrese de hacerle al mdico cualquier pregunta que tenga. Document Revised: 07/09/2020 Document Reviewed: 07/09/2020 Elsevier Patient Education  2023 Elsevier Inc.  

## 2022-05-28 ENCOUNTER — Encounter: Payer: Self-pay | Admitting: Emergency Medicine

## 2022-05-28 ENCOUNTER — Other Ambulatory Visit: Payer: Self-pay | Admitting: Emergency Medicine

## 2022-05-28 DIAGNOSIS — R051 Acute cough: Secondary | ICD-10-CM

## 2022-05-28 MED ORDER — PROMETHAZINE-DM 6.25-15 MG/5ML PO SYRP: 5.0000 mL | ORAL_SOLUTION | Freq: Four times a day (QID) | ORAL | 0 refills | Status: DC | PRN

## 2022-05-28 MED ORDER — BENZONATATE 200 MG PO CAPS: 200.0000 mg | ORAL_CAPSULE | Freq: Two times a day (BID) | ORAL | 0 refills | Status: DC | PRN

## 2022-05-28 NOTE — Telephone Encounter (Signed)
Prescriptions for Tessalon and Phenergan DM sent to pharmacy of record today.  Thanks.

## 2022-05-29 ENCOUNTER — Other Ambulatory Visit: Payer: 59 | Admitting: Emergency Medicine

## 2022-05-29 DIAGNOSIS — J449 Chronic obstructive pulmonary disease, unspecified: Secondary | ICD-10-CM

## 2022-05-29 MED ORDER — TRELEGY ELLIPTA 100-62.5-25 MCG/ACT IN AEPB: INHALATION_SPRAY | RESPIRATORY_TRACT | 3 refills | Status: DC

## 2022-05-31 ENCOUNTER — Encounter: Payer: Self-pay | Admitting: Emergency Medicine

## 2022-06-23 ENCOUNTER — Other Ambulatory Visit: Payer: Self-pay | Admitting: Emergency Medicine

## 2022-06-23 DIAGNOSIS — J449 Chronic obstructive pulmonary disease, unspecified: Secondary | ICD-10-CM

## 2022-07-13 ENCOUNTER — Other Ambulatory Visit: Payer: Self-pay | Admitting: Emergency Medicine

## 2022-07-13 DIAGNOSIS — J449 Chronic obstructive pulmonary disease, unspecified: Secondary | ICD-10-CM

## 2022-07-22 ENCOUNTER — Other Ambulatory Visit: Payer: Self-pay | Admitting: Emergency Medicine

## 2022-07-22 DIAGNOSIS — I10 Essential (primary) hypertension: Secondary | ICD-10-CM

## 2022-08-05 ENCOUNTER — Other Ambulatory Visit: Payer: Self-pay | Admitting: Emergency Medicine

## 2022-08-05 DIAGNOSIS — J449 Chronic obstructive pulmonary disease, unspecified: Secondary | ICD-10-CM

## 2022-08-21 ENCOUNTER — Ambulatory Visit (INDEPENDENT_AMBULATORY_CARE_PROVIDER_SITE_OTHER): Payer: 59

## 2022-08-21 ENCOUNTER — Encounter: Payer: Self-pay | Admitting: Emergency Medicine

## 2022-08-21 ENCOUNTER — Ambulatory Visit (INDEPENDENT_AMBULATORY_CARE_PROVIDER_SITE_OTHER): Payer: 59 | Admitting: Emergency Medicine

## 2022-08-21 VITALS — BP 130/72 | HR 80 | Temp 98.5°F | Ht 61.0 in | Wt 166.0 lb

## 2022-08-21 DIAGNOSIS — M545 Low back pain, unspecified: Secondary | ICD-10-CM | POA: Diagnosis not present

## 2022-08-21 DIAGNOSIS — J302 Other seasonal allergic rhinitis: Secondary | ICD-10-CM

## 2022-08-21 DIAGNOSIS — I10 Essential (primary) hypertension: Secondary | ICD-10-CM

## 2022-08-21 DIAGNOSIS — M159 Polyosteoarthritis, unspecified: Secondary | ICD-10-CM

## 2022-08-21 DIAGNOSIS — E785 Hyperlipidemia, unspecified: Secondary | ICD-10-CM | POA: Diagnosis not present

## 2022-08-21 DIAGNOSIS — M25562 Pain in left knee: Secondary | ICD-10-CM | POA: Diagnosis not present

## 2022-08-21 DIAGNOSIS — L239 Allergic contact dermatitis, unspecified cause: Secondary | ICD-10-CM | POA: Diagnosis not present

## 2022-08-21 DIAGNOSIS — Z8719 Personal history of other diseases of the digestive system: Secondary | ICD-10-CM

## 2022-08-21 MED ORDER — OMEPRAZOLE 40 MG PO CPDR: 40.0000 mg | DELAYED_RELEASE_CAPSULE | Freq: Every day | ORAL | 3 refills | Status: DC

## 2022-08-21 MED ORDER — PREDNISONE 20 MG PO TABS: 40.0000 mg | ORAL_TABLET | Freq: Every day | ORAL | 1 refills | Status: AC

## 2022-08-21 MED ORDER — SIMVASTATIN 20 MG PO TABS: 20.0000 mg | ORAL_TABLET | Freq: Every day | ORAL | 3 refills | Status: DC

## 2022-08-21 MED ORDER — MONTELUKAST SODIUM 10 MG PO TABS: 10.0000 mg | ORAL_TABLET | Freq: Every day | ORAL | 1 refills | Status: DC

## 2022-08-21 NOTE — Assessment & Plan Note (Signed)
BP Readings from Last 3 Encounters:  08/21/22 130/72  05/15/22 120/64  12/19/21 130/70  Well-controlled hypertension Continue diltiazem 240 mg daily

## 2022-08-21 NOTE — Assessment & Plan Note (Signed)
Intermittent chronic pains to multiple joints including lumbar spine and left knee Unremarkable x-rays done today Pain management discussed Take Tylenol and or NSAIDs as needed for pain

## 2022-08-21 NOTE — Assessment & Plan Note (Signed)
Unknown trigger. Recommend trial of prednisone 40 mg daily for 5 days May need referral to allergist

## 2022-08-21 NOTE — Progress Notes (Signed)
Dana Daniels 77 y.o.   Chief Complaint  Patient presents with   Knee Pain    Patient states she is having pain in her left knee, unsure of why it is hurting, no falls or injuries, x 1 week    Back Pain    X 1 month off and on pain    skin issues    Rash, and itching all over her body     HISTORY OF PRESENT ILLNESS: This is a 77 y.o. female complaining of pain to lumbar area and left knee for the past several weeks Denies injuries or falls. Also complaining of 71-month history of intermittent itchy rash to several parts of her body.  Unknown triggers. No other complaints or medical concerns today.  Knee Pain   Back Pain Pertinent negatives include no abdominal pain, chest pain, dysuria, fever or headaches.     Prior to Admission medications   Medication Sig Start Date End Date Taking? Authorizing Provider  albuterol (VENTOLIN HFA) 108 (90 Base) MCG/ACT inhaler INHALE 2 PUFFS BY MOUTH EVERY 6 HOURS AS NEEDED FOR WHEEZING OR SHORTNESS OF BREATH 08/06/22  Yes Armonie Mettler, Eilleen Kempf, MD  azithromycin [facility]) 250 MG tablet Sig as indicated 05/15/22  Yes Benjamine Strout, Eilleen Kempf, MD  benzonatate (TESSALON) 200 MG capsule Take 1 capsule (200 mg total) by mouth 2 (two) times daily as needed for cough. 05/28/22  Yes Dekari Bures, Eilleen Kempf, MD  cetirizine (ZYRTEC) 10 MG tablet Take 1 tablet (10 mg total) by mouth daily. 05/15/22 09/12/22 Yes Donesha Wallander, Eilleen Kempf, MD  cromolyn (NASALCROM) 5.2 MG/ACT nasal spray Place 1 spray into both nostrils 2 (two) times daily. 01/13/19  Yes Georgina Quint, MD  diltiazem [facility]) 240 MG 24 hr capsule Take 1 capsule by mouth once daily 07/22/22  Yes Emmet Messer, Eilleen Kempf, MD  Fluticasone-Umeclidin-Vilant (TRELEGY ELLIPTA) 100-62.5-25 MCG/ACT AEPB Inhale 1 puff by mouth once daily 05/29/22  Yes Kamila Broda, Eilleen Kempf, MD  HYDROcodone bit-homatropine (HYCODAN) 5-1.5 MG/5ML syrup Take 5 mLs by mouth at bedtime as needed for cough. 05/15/22  Yes  Dontario Evetts, Eilleen Kempf, MD  nitroGLYCERIN (NITROSTAT) 0.4 MG SL tablet DISSOLVE ONE TABLET UNDER THE TONGUE EVERY 5 MINUTES AS NEEDED FOR CHEST PAIN.  DO NOT EXCEED A TOTAL OF 3 DOSES IN 15 MINUTES 02/08/21  Yes Aroura Vasudevan, Eilleen Kempf, MD  promethazine-dextromethorphan (PROMETHAZINE-DM) 6.25-15 MG/5ML syrup Take 5 mLs by mouth 4 (four) times daily as needed for cough. 05/28/22  Yes Christion Leonhard, Eilleen Kempf, MD  Ubrogepant (UBRELVY) 100 MG TABS Take 100 mg by mouth daily as needed. Take at onset of migraine headache 04/17/21  Yes Jaxsyn Catalfamo, Eilleen Kempf, MD  montelukast (SINGULAIR) 10 MG tablet Take 1 tablet (10 mg total) by mouth at bedtime. 05/15/22 08/13/22  Georgina Quint, MD  omeprazole (PRILOSEC) 40 MG capsule Take 1 capsule (40 mg total) by mouth daily. 08/05/21 07/31/22  Georgina Quint, MD  simvastatin (ZOCOR) 20 MG tablet Take 1 tablet (20 mg total) by mouth daily. 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   Seasonal allergies 03/31/2018   History of gastroesophageal reflux (GERD) 09/25/2017   Osteoarthritis of multiple joints 03/24/2017   Chronic pain of left knee 03/24/2017   Chronic  obstructive pulmonary disease (HCC) 03/24/2017   Primary cancer of skin of left foot 02/03/2017   Mild intermittent asthma without complication 02/03/2017   Sleep apnea 02/03/2017   Dyslipidemia 02/03/2017   Migraine 02/03/2017   Essential hypertension 09/18/2008   Acute cough 09/18/2008    Past Medical History:  Diagnosis Date   Allergy    Arthritis    Asthma    Blood transfusion without reported diagnosis    Cataract    Hyperlipidemia    Hypertension    Migraine    Osteoporosis    Sleep apnea     Past Surgical History:   Procedure Laterality Date   ABDOMINAL HYSTERECTOMY     CHOLECYSTECTOMY     EYE SURGERY Left    cataract    FOOT SURGERY Left     Social History   Socioeconomic History   Marital status: Widowed    Spouse name: Not on file   Number of children: Not on file   Years of education: Not on file   Highest education level: Not on file  Occupational History   Not on file  Tobacco Use   Smoking status: Never   Smokeless tobacco: Never  Vaping Use   Vaping Use: Never used  Substance and Sexual Activity   Alcohol use: No   Drug use: Never   Sexual activity: Not on file  Other Topics Concern   Not on file  Social History Narrative   Not on file   Social Determinants of Health   Financial Resource Strain: Not on file  Food Insecurity: Not on file  Transportation Needs: Not on file  Physical Activity: Not on file  Stress: Not on file  Social Connections: Not on file  Intimate Partner Violence: Not on file    Family History  Problem Relation Age of Onset   Heart disease Mother    Hyperlipidemia Mother    Hypertension Mother    Stroke Mother    Heart disease Father    Stroke Father    Hypertension Son    Hyperlipidemia Son    Fibromyalgia Son    Lupus Son    Hypertension Son    Diabetes Daughter    Hypertension Daughter      Review of Systems  Constitutional: Negative.  Negative for fever.  HENT: Negative.  Negative for congestion and sore throat.   Respiratory: Negative.  Negative for cough and shortness of breath.   Cardiovascular: Negative.  Negative for chest pain and palpitations.  Gastrointestinal:  Negative for abdominal pain, nausea and vomiting.  Genitourinary:  Negative for dysuria.  Musculoskeletal:  Positive for back pain and joint pain.  Skin:  Positive for itching and rash.  Neurological: Negative.  Negative for dizziness and headaches.  All other systems reviewed and are negative.   Vitals:   08/21/22 1059  BP: 130/72  Pulse: 80  Temp:  98.5 F (36.9 C)  SpO2: 94%    Physical Exam Vitals reviewed.  Constitutional:      Appearance: Normal appearance.  HENT:     Head: Normocephalic.     Mouth/Throat:     Mouth: Mucous membranes are moist.     Pharynx: Oropharynx is clear.  Eyes:     Extraocular Movements: Extraocular movements intact.     Pupils: Pupils are equal, round, and reactive to light.  Cardiovascular:     Rate and Rhythm: Normal rate and regular rhythm.     Pulses: Normal pulses.     Heart  sounds: Normal heart sounds.  Pulmonary:     Effort: Pulmonary effort is normal.     Breath sounds: Normal breath sounds.  Musculoskeletal:     Cervical back: No tenderness.  Lymphadenopathy:     Cervical: No cervical adenopathy.  Skin:    General: Skin is warm and dry.     Capillary Refill: Capillary refill takes less than 2 seconds.  Neurological:     General: No focal deficit present.     Mental Status: She is alert and oriented to person, place, and time.  Psychiatric:        Mood and Affect: Mood normal.        Behavior: Behavior normal.    DG Lumbar Spine 2-3 Views  Result Date: 08/21/2022 CLINICAL DATA:  Lower back pain for 1 month. EXAM: LUMBAR SPINE - 2-3 VIEW COMPARISON:  None Available. FINDINGS: There are 5 non-rib-bearing lumbar-type vertebral bodies. Vertebral body heights are preserved, without evidence of acute fracture. Alignment is normal. The disc heights are overall preserved. There is mild facet arthropathy at L4-L5 and L5-S1. The SI joints are intact. Cholecystectomy clips are noted. The soft tissues are otherwise unremarkable. IMPRESSION: No acute finding in the lumbar spine. Mild facet arthropathy in the lower lumbar spine. Electronically Signed   By: Lesia Hausen M.D.   On: 08/21/2022 11:52   DG Knee Complete 4 Views Left  Result Date: 08/21/2022 CLINICAL DATA:  Pain EXAM: LEFT KNEE - COMPLETE 4 VIEW COMPARISON:  None Available. FINDINGS: No evidence of fracture, dislocation, or joint  effusion. No evidence of arthropathy or other focal bone abnormality. Soft tissues are unremarkable. IMPRESSION: Negative. Electronically Signed   By: Allegra Lai M.D.   On: 08/21/2022 11:51     ASSESSMENT & PLAN: A total of 46 minutes was spent with the patient and counseling/coordination of care regarding preparing for this visit, review of most recent office visit notes, review of multiple chronic medical conditions under management, review of all medications, review of x-rays reports done today, pain management, treatment of intermittent allergies, prognosis, documentation, and need for follow-up  Problem List Items Addressed This Visit       Cardiovascular and Mediastinum   Essential hypertension    BP Readings from Last 3 Encounters:  08/21/22 130/72  05/15/22 120/64  12/19/21 130/70  Well-controlled hypertension Continue diltiazem 240 mg daily       Relevant Medications   simvastatin (ZOCOR) 20 MG tablet     Musculoskeletal and Integument   Osteoarthritis of multiple joints - Primary    Intermittent chronic pains to multiple joints including lumbar spine and left knee Unremarkable x-rays done today Pain management discussed Take Tylenol and or NSAIDs as needed for pain      Relevant Medications   predniSONE (DELTASONE) 20 MG tablet   Other Relevant Orders   DG Lumbar Spine 2-3 Views (Completed)   DG Knee Complete 4 Views Left (Completed)   Allergic dermatitis    Unknown trigger. Recommend trial of prednisone 40 mg daily for 5 days May need referral to allergist        Other   Dyslipidemia   Relevant Medications   simvastatin (ZOCOR) 20 MG tablet   History of gastroesophageal reflux (GERD)   Relevant Medications   omeprazole (PRILOSEC) 40 MG capsule   Seasonal allergies   Relevant Medications   montelukast (SINGULAIR) 10 MG tablet   Patient Instructions  Artrosis Osteoarthritis  La artrosis es un tipo de artritis. Esta afeccin produce  dolor o  enfermedad en las articulaciones. La artrosis afecta al tejido que cubre los extremos de los huesos en las articulaciones (cartlago). El cartlago acta como amortiguador [employer] y los ayuda a moverse con suavidad. La artrosis se presenta cuando el cartlago de las articulaciones se gasta. A veces, la artrosis se denomina artritis "por uso y desgaste". La artrosis es la forma ms frecuente de artritis. A menudo, afecta a las [employer]. Es una afeccin que empeora con el [place]. Las articulaciones afectadas con mayor frecuencia por esta afeccin se encuentran en los dedos de [employer], los dedos de [employer], las caderas, las rodillas y la columna vertebral, incluyendo el cuello y la parte inferior de la espalda. Cules son las causas? Esta afeccin es causada por el desgaste del cartlago que cubre los extremos de [place]. Qu incrementa el riesgo? Los siguientes factores pueden hacer que sea ms propenso a Aeronautical engineer afeccin: Ser mayor de 50 aos. Obesidad. Uso excesivo de Nurse, learning disability. Lesin pasada de Risk analyst. Ciruga pasada en una articulacin. Antecedentes familiares de artrosis. Cules son los signos o sntomas? Los principales sntomas de esta enfermedad son dolor, hinchazn y Geophysical data processor. Otros sntomas pueden incluir: Agrandamiento de Nurse, learning disability. Aumento del dolor y dao adicional causado por pequeos trozos de [employer] o TEFL teacher que se desprenden y flotan dentro de Nurse, learning disability. Formacin de pequeos depsitos de hueso (osteofitos) en los extremos de Nurse, learning disability. Una sensacin de chirrido o raspado dentro de la articulacin al moverla. Sonidos de chasquido o crujido al [employer]. Dificultad para caminar o hacer ejercicio. Incapacidad para agarrar objetos, girar la mano o controlar los movimientos de las manos y los dedos. Cmo se diagnostica? Esta afeccin se puede diagnosticar en funcin de lo siguiente: Sus  antecedentes mdicos. Un examen fsico. Los sntomas. Radiografas de las articulaciones afectadas. Anlisis de sangre para descartar otros tipos de artritis. Cmo se trata? No hay cura para esta enfermedad, pero el tratamiento puede ayudar a Human resources officer y Scientist, clinical (histocompatibility and immunogenetics) el funcionamiento de Nurse, learning disability. El tratamiento puede incluir una combinacin de terapias, [place] las siguientes: Tcnicas de alivio del dolor, como: Aplicacin de calor y fro en la articulacin. Masajes. Una forma de psicoterapia llamada terapia cognitivo conductual (TCC). Esta terapia le ayuda a Consulting civil engineer y a Education officer, environmental un seguimiento de los cambios que hace. Analgsicos y antiinflamatorios. Los medicamentos pueden tomarse por boca o [employer]. Incluyen los siguientes: Antiinflamatorios no esteroideos (AINE), como el ibuprofeno. Medicamentos recetados. Antiinflamatorios fuertes (corticoesteroides). Ciertos suplementos nutricionales. Un programa de ejercicios recomendado. Puede trabajar con un fisioterapeuta. Dispositivos de [place], como un dispositivo ortopdico, una frula, un guante especial o un bastn. Un plan de control del peso. [place], como: Carolin Sicks. Se hace para volver a posicionar los [employer] y Engineer, materials o para Oceanographer los trozos sueltos de hueso y TEFL teacher. Ciruga de reemplazo articular. Es posible que necesite esta ciruga si tiene una artrosis [place]. Siga estas indicaciones en su casa: Actividad Descanse las articulaciones afectadas como se lo haya indicado el mdico. Haga actividad fsica como se lo haya indicado el mdico. El mdico puede recomendar tipos especficos de ejercicios, por ejemplo: Ejercicios de fortalecimiento. Se realizan para fortalecer los msculos que sostienen las articulaciones afectadas por la artritis. Ejercicios aerbicos. Son ejercicios, como caminar a paso ligero o hacer gimnasia [place] acutica, que aumentan la frecuencia  cardaca. Actividades de amplitud de movimientos. Estos ayudan a que las articulaciones se [employer] con  ms facilidad. Ejercicios de equilibrio y [place]. Control del dolor, la rigidez y la hinchazn     Si se lo indican, aplique calor en la zona afectada con la frecuencia que le haya dicho el mdico. Use la fuente de calor que el mdico le recomiende, como una compresa de calor hmedo o una almohadilla trmica. Si tiene un dispositivo de [employer] se puede quitar, [employer] segn lo indicado por su mdico. Coloque una toalla entre la piel y la fuente de Airline pilot. Si el mdico le indica que no se quite el dispositivo de USAA se Tax inspector, coloque una toalla entre el dispositivo de [place] y la fuente de Airline pilot. Aplique calor durante 20 a 30 minutos. Si se lo indican, aplique hielo en la zona afectada. Si tiene un dispositivo de ayuda que se puede quitar, [employer] segn lo indicado por su mdico. Ponga el hielo en una bolsa plstica. Coloque una toalla entre la piel y Copy. Si el mdico le indica que no se quite el dispositivo de USAA se aplica hielo, coloque una toalla entre el dispositivo de [place] y la bolsa de hielo. Aplique el hielo durante 20 minutos, 2 o 3 veces por da. Si la piel se le pone de color rojo brillante, retire el hielo o Company secretary de inmediato para evitar daos en la piel. El [place] de dao es mayor si no puede sentir dolor, Airline pilot o fro. Mueva los dedos de las manos o de los pies con frecuencia para reducir la rigidez y la hinchazn. Cuando est sentado o acostado, levante (eleve) la zona afectada por encima del nivel del corazn. Indicaciones generales Use los medicamentos de venta libre y los recetados solamente como se lo haya indicado el mdico. Mantenga un peso saludable. Siga las instrucciones del mdico con respecto al control del [place]. No consuma ningn producto que contenga nicotina o tabaco. Estos productos incluyen cigarrillos, tabaco para  Theatre manager y aparatos de vapeo, como los Administrator, Civil Service. Si necesita ayuda para dejar de consumir estos productos, consulte al [employer] Express. Use los dispositivos de ayuda como se lo haya indicado el mdico. Dnde buscar ms informacin [employer] of Arthritis and Musculoskeletal and Skin Diseases (Instituto [employer] de Artritis y [place] Musculoesquelticas y Arboriculturist): niams.http://www.myers.net/ [employer] on Aging Lawyer sobre el Envejecimiento): BaseRingTones.pl Cel[employer] Corporation of Rheumatology (Instituto Estadounidense de Reumatologa): rheumatology.org Comunquese con un mdico si: Tiene enrojecimiento, hinchazn o sensacin de calor que empeora en una articulacin. Tiene fiebre y siente dolor en la articulacin o el msculo. Presenta una erupcin cutnea. Tiene dificultad para [employer] cotidianas. Siente dolor que empeora y no se alivia con los analgsicos. Esta informacin no tiene Theme park manager el consejo del mdico. Asegrese de hacerle al mdico cualquier pregunta que tenga. Document Revised: 11/19/2021 Document Reviewed: 11/19/2021 Elsevier Patient Education  2024 Elsevier Inc.      Edwina Barth, MD [facility]

## 2022-08-21 NOTE — Patient Instructions (Signed)
Artrosis Osteoarthritis  La artrosis es un tipo de artritis. Esta afeccin produce dolor o enfermedad en las articulaciones. La artrosis afecta al tejido que cubre los extremos de los huesos en las articulaciones (cartlago). El cartlago acta como amortiguador [employer] y los ayuda a moverse con suavidad. La artrosis se presenta cuando el cartlago de las articulaciones se gasta. A veces, la artrosis se denomina artritis "por uso y desgaste". La artrosis es la forma ms frecuente de artritis. A menudo, afecta a las [employer]. Es una afeccin que empeora con el [place]. Las articulaciones afectadas con mayor frecuencia por esta afeccin se encuentran en los dedos de [employer], los dedos de [employer], las caderas, las rodillas y la columna vertebral, incluyendo el cuello y la parte inferior de la espalda. Cules son las causas? Esta afeccin es causada por el desgaste del cartlago que cubre los extremos de [place]. Qu incrementa el riesgo? Los siguientes factores pueden hacer que sea ms propenso a Aeronautical engineer afeccin: Ser mayor de 50 aos. Obesidad. Uso excesivo de Nurse, learning disability. Lesin pasada de Risk analyst. Ciruga pasada en una articulacin. Antecedentes familiares de artrosis. Cules son los signos o sntomas? Los principales sntomas de esta enfermedad son dolor, hinchazn y Geophysical data processor. Otros sntomas pueden incluir: Agrandamiento de Nurse, learning disability. Aumento del dolor y dao adicional causado por pequeos trozos de [employer] o TEFL teacher que se desprenden y flotan dentro de Nurse, learning disability. Formacin de pequeos depsitos de hueso (osteofitos) en los extremos de Nurse, learning disability. Una sensacin de chirrido o raspado dentro de la articulacin al moverla. Sonidos de chasquido o crujido al [employer]. Dificultad para caminar o hacer ejercicio. Incapacidad para agarrar objetos, girar la mano o controlar los movimientos de las manos y los dedos. Cmo  se diagnostica? Esta afeccin se puede diagnosticar en funcin de lo siguiente: Sus antecedentes mdicos. Un examen fsico. Los sntomas. Radiografas de las articulaciones afectadas. Anlisis de sangre para descartar otros tipos de artritis. Cmo se trata? No hay cura para esta enfermedad, pero el tratamiento puede ayudar a Human resources officer y Scientist, clinical (histocompatibility and immunogenetics) el funcionamiento de Nurse, learning disability. El tratamiento puede incluir una combinacin de terapias, [place] las siguientes: Tcnicas de alivio del dolor, como: Aplicacin de calor y fro en la articulacin. Masajes. Una forma de psicoterapia llamada terapia cognitivo conductual (TCC). Esta terapia le ayuda a Consulting civil engineer y a Education officer, environmental un seguimiento de los cambios que hace. Analgsicos y antiinflamatorios. Los medicamentos pueden tomarse por boca o [employer]. Incluyen los siguientes: Antiinflamatorios no esteroideos (AINE), como el ibuprofeno. Medicamentos recetados. Antiinflamatorios fuertes (corticoesteroides). Ciertos suplementos nutricionales. Un programa de ejercicios recomendado. Puede trabajar con un fisioterapeuta. Dispositivos de [place], como un dispositivo ortopdico, una frula, un guante especial o un bastn. Un plan de control del peso. [place], como: Carolin Sicks. Se hace para volver a posicionar los [employer] y Engineer, materials o para Oceanographer los trozos sueltos de hueso y TEFL teacher. Ciruga de reemplazo articular. Es posible que necesite esta ciruga si tiene una artrosis [place]. Siga estas indicaciones en su casa: Actividad Descanse las articulaciones afectadas como se lo haya indicado el mdico. Haga actividad fsica como se lo haya indicado el mdico. El mdico puede recomendar tipos especficos de ejercicios, por ejemplo: Ejercicios de fortalecimiento. Se realizan para fortalecer los msculos que sostienen las articulaciones afectadas por la artritis. Ejercicios aerbicos. Son ejercicios, como caminar a  paso ligero o hacer gimnasia [place] acutica, que aumentan la frecuencia cardaca. Actividades de  amplitud de movimientos. Estos ayudan a que las articulaciones se muevan con ms facilidad. Ejercicios de equilibrio y [place]. Control del dolor, la rigidez y la hinchazn     Si se lo indican, aplique calor en la zona afectada con la frecuencia que le haya dicho el mdico. Use la fuente de calor que el mdico le recomiende, como una compresa de calor hmedo o una almohadilla trmica. Si tiene un dispositivo de [employer] se puede quitar, [employer] segn lo indicado por su mdico. Coloque una toalla entre la piel y la fuente de Airline pilot. Si el mdico le indica que no se quite el dispositivo de USAA se Tax inspector, coloque una toalla entre el dispositivo de [place] y la fuente de Airline pilot. Aplique calor durante 20 a 30 minutos. Si se lo indican, aplique hielo en la zona afectada. Si tiene un dispositivo de ayuda que se puede quitar, [employer] segn lo indicado por su mdico. Ponga el hielo en una bolsa plstica. Coloque una toalla entre la piel y Copy. Si el mdico le indica que no se quite el dispositivo de USAA se aplica hielo, coloque una toalla entre el dispositivo de [place] y la bolsa de hielo. Aplique el hielo durante 20 minutos, 2 o 3 veces por da. Si la piel se le pone de color rojo brillante, retire el hielo o Company secretary de inmediato para evitar daos en la piel. El [place] de dao es mayor si no puede sentir dolor, Airline pilot o fro. Mueva los dedos de las manos o de los pies con frecuencia para reducir la rigidez y la hinchazn. Cuando est sentado o acostado, levante (eleve) la zona afectada por encima del nivel del corazn. Indicaciones generales Use los medicamentos de venta libre y los recetados solamente como se lo haya indicado el mdico. Mantenga un peso saludable. Siga las instrucciones del mdico con respecto al control del [place]. No consuma ningn producto que  contenga nicotina o tabaco. Estos productos incluyen cigarrillos, tabaco para Theatre manager y aparatos de vapeo, como los Administrator, Civil Service. Si necesita ayuda para dejar de consumir estos productos, consulte al [employer]. Use los dispositivos de ayuda como se lo haya indicado el mdico. Dnde buscar ms informacin [employer] of Arthritis and Musculoskeletal and Skin Diseases (Instituto [employer] de Artritis y [place] Musculoesquelticas y Arboriculturist): niams.http://www.myers.net/ [employer] on Aging Lawyer sobre el Envejecimiento): BaseRingTones.pl [employer] of Rheumatology (Instituto Estadounidense de Reumatologa): rheumatology.org Comunquese con un mdico si: Tiene enrojecimiento, hinchazn o sensacin de calor que empeora en una articulacin. Tiene fiebre y siente dolor en la articulacin o el msculo. Presenta una erupcin cutnea. Tiene dificultad para [employer] cotidianas. Siente dolor que empeora y no se alivia con los analgsicos. Esta informacin no tiene Theme park manager el consejo del mdico. Asegrese de hacerle al mdico cualquier pregunta que tenga. Document Revised: 11/19/2021 Document Reviewed: 11/19/2021 Elsevier Patient Education  2024 [employer].

## 2022-08-28 ENCOUNTER — Other Ambulatory Visit: Payer: Self-pay | Admitting: Emergency Medicine

## 2022-08-28 DIAGNOSIS — J449 Chronic obstructive pulmonary disease, unspecified: Secondary | ICD-10-CM

## 2022-09-09 ENCOUNTER — Ambulatory Visit: Payer: 59 | Admitting: Emergency Medicine

## 2022-09-22 ENCOUNTER — Other Ambulatory Visit: Payer: Self-pay | Admitting: Emergency Medicine

## 2022-09-22 ENCOUNTER — Ambulatory Visit: Payer: 59 | Admitting: Emergency Medicine

## 2022-09-22 DIAGNOSIS — J449 Chronic obstructive pulmonary disease, unspecified: Secondary | ICD-10-CM

## 2022-09-30 ENCOUNTER — Ambulatory Visit (INDEPENDENT_AMBULATORY_CARE_PROVIDER_SITE_OTHER): Payer: 59 | Admitting: Emergency Medicine

## 2022-09-30 ENCOUNTER — Encounter: Payer: Self-pay | Admitting: Emergency Medicine

## 2022-09-30 VITALS — BP 130/80 | HR 89 | Temp 98.6°F | Ht 61.0 in | Wt 164.2 lb

## 2022-09-30 DIAGNOSIS — G8929 Other chronic pain: Secondary | ICD-10-CM | POA: Diagnosis not present

## 2022-09-30 DIAGNOSIS — M25562 Pain in left knee: Secondary | ICD-10-CM

## 2022-09-30 DIAGNOSIS — F5104 Psychophysiologic insomnia: Secondary | ICD-10-CM | POA: Insufficient documentation

## 2022-09-30 DIAGNOSIS — G4733 Obstructive sleep apnea (adult) (pediatric): Secondary | ICD-10-CM | POA: Diagnosis not present

## 2022-09-30 MED ORDER — ZOLPIDEM TARTRATE 5 MG PO TABS
5.0000 mg | ORAL_TABLET | Freq: Every evening | ORAL | 1 refills | Status: DC | PRN
Start: 2022-09-30 — End: 2023-05-28

## 2022-09-30 NOTE — Assessment & Plan Note (Signed)
Active and affecting quality of life Obstructive sleep apnea contributing Recommend Ambien 5 mg daily Sleep hygiene measures discussed

## 2022-09-30 NOTE — Assessment & Plan Note (Signed)
Chronic and affecting quality of life Needs to reestablish care with sleep apnea specialist Needs new  equipment and supplies Needs reevaluation.  Referral placed today.

## 2022-09-30 NOTE — Patient Instructions (Signed)
Dolor agudo de [employer] adultos Acute Knee Pain, Adult El dolor de rodilla puede tener muchas causas. A veces, el dolor de rodilla es repentino (agudo) y puede deberse a dao, hinchazn o irritacin de los msculos y tejidos que sostienen la rodilla. A menudo, el dolor desaparece solo con el tiempo y con reposo. Si el dolor no desaparece, pueden hacerse pruebas para hallar su causa. Siga estas instrucciones en su casa: Si tiene una rodillera o un dispositivo ortopdico:  Use la rodillera o el dispositivo ortopdico como se lo haya indicado el mdico. Quteselos solamente como se lo haya indicado el mdico. Afljeselos si los dedos del pie: [place]. Se adormecen. Se tornan fros y de [employer]. Mantngalos limpios. Si la rodillera o el dispositivo ortopdico no son impermeables: No deje que se mojen. Cbralos con un envoltorio hermtico cuando tome un bao de inmersin o una ducha. Actividad Descanse la rodilla. No haga cosas que le causen dolor o que lo intensifiquen. Evite las actividades en las que ambos pies no estn en contacto con el piso al mismo tiempo (actividades de alto impacto). Algunos ejemplos son correr, Public relations account executive soga y hacer saltos de tijera. Trabaje con un fisioterapeuta para crear un programa de ejercicios seguros, como le haya indicado el mdico. Control del dolor, la rigidez y la hinchazn  Si se lo indican, aplique hielo sobre la rodilla. Para hacer esto: Si tiene una rodillera o un dispositivo ortopdico que se puede quitar, proceda como se lo haya indicado el mdico. Ponga el hielo en una bolsa plstica. Coloque una toalla entre la piel y Copy. Aplique el hielo durante 20 minutos, 2 o 3 veces por da. Retire el hielo si la piel se le pone de color rojo brillante. Esto es Intel. Si no puede sentir dolor, calor o fro, tiene un mayor riesgo de que se dae la zona. Si se lo indican, use una venda elstica para ejercer presin (compresin) en la  rodilla lesionada. Cuando est sentado o acostado, eleve la rodilla por encima del nivel del corazn. Duerma con una almohada debajo de la rodilla. Indicaciones generales Use los medicamentos de venta libre y los recetados solamente como se lo haya indicado el mdico. No fume ni consuma ningn producto que contenga nicotina o tabaco. Si necesita ayuda para dejar de consumir estos productos, consulte al mdico. Si tiene sobrepeso, trabaje con su mdico y un experto en alimentos (nutricionista) para establecer metas para bajar de peso. El sobrepeso puede aumentar el dolor de rodilla. Controle si hay algn cambio en sus sntomas. Cumpla con todas las visitas de seguimiento. Comunquese con un mdico si: El dolor de rodilla no desaparece. El dolor de rodilla cambia o [place]. Tiene fiebre junto con dolor de rodilla. La rodilla est enrojecida o se siente caliente al tacto. La rodilla le falla o se le queda trabada. Solicite ayuda de inmediato si: La rodilla se le hincha, y la Barrister's clerk. No puede mover la rodilla. Tiene un dolor muy intenso en la rodilla que no se alivia con analgsicos. Resumen El dolor de rodilla puede tener muchas causas. A menudo, el dolor desaparece solo con el tiempo y con reposo. El mdico puede hacerle estudios para Financial risk analyst la causa del dolor. Controle si hay algn cambio en sus sntomas. [employer] dolor con descanso, medicamentos, actividad de poca intensidad y [place] de hielo. Solicite ayuda de inmediato si no puede mover la rodilla o si el dolor de rodilla es muy intenso. Esta  informacin no tiene Theme park manager el consejo del mdico. Asegrese de hacerle al mdico cualquier pregunta que tenga. Document Revised: 09/20/2019 Document Reviewed: 09/20/2019 Elsevier Patient Education  2024 [employer].

## 2022-09-30 NOTE — Assessment & Plan Note (Signed)
Active and affecting quality of life Pain management discussed Recommend sports medicine evaluation Referral placed today

## 2022-09-30 NOTE — Progress Notes (Signed)
Dana Daniels 77 y.o.   Chief Complaint  Patient presents with   Knee Pain    Patient states she is having some left knee pain, patient needs a new CPAP machine     HISTORY OF PRESENT ILLNESS: This is a 77 y.o. female complaining of chronic pain to left knee Also has history of obstructive sleep apnea.  Needs follow-up for sleep studies Also complaining of chronic insomnia No other complaints or medical concerns today.  HPI   Prior to Admission medications   Medication Sig Start Date End Date Taking? Authorizing Provider  albuterol (VENTOLIN HFA) 108 (90 Base) MCG/ACT inhaler INHALE 2 PUFFS BY MOUTH EVERY 6 HOURS AS NEEDED FOR WHEEZING FOR SHORTNESS OF BREATH 09/22/22  Yes Haaris Metallo, Eilleen Kempf, MD  cromolyn (NASALCROM) 5.2 MG/ACT nasal spray Place 1 spray into both nostrils 2 (two) times daily. 01/13/19  Yes Mehran Guderian, Eilleen Kempf, MD  diltiazem (CARDIZEM CD) 240 MG 24 hr capsule Take 1 capsule by mouth once daily 07/22/22  Yes Rashia Mckesson, Eilleen Kempf, MD  Fluticasone-Umeclidin-Vilant (TRELEGY ELLIPTA) 100-62.5-25 MCG/ACT AEPB Inhale 1 puff by mouth once daily 05/29/22  Yes Trayce Caravello, Eilleen Kempf, MD  montelukast (SINGULAIR) 10 MG tablet Take 1 tablet (10 mg total) by mouth at bedtime. 08/21/22 02/17/23 Yes Layani Foronda, Eilleen Kempf, MD  nitroGLYCERIN (NITROSTAT) 0.4 MG SL tablet DISSOLVE ONE TABLET UNDER THE TONGUE EVERY 5 MINUTES AS NEEDED FOR CHEST PAIN.  DO NOT EXCEED A TOTAL OF 3 DOSES IN 15 MINUTES 02/08/21  Yes Dantre Yearwood, Eilleen Kempf, MD  omeprazole (PRILOSEC) 40 MG capsule Take 1 capsule (40 mg total) by mouth daily. 08/21/22 08/16/23 Yes Garvis Downum, Eilleen Kempf, MD  simvastatin (ZOCOR) 20 MG tablet Take 1 tablet (20 mg total) by mouth daily. 08/21/22 08/16/23 Yes Jaris Kohles, Eilleen Kempf, MD  Ubrogepant (UBRELVY) 100 MG TABS Take 100 mg by mouth daily as needed. Take at onset of migraine headache 04/17/21  Yes Georgina Quint, MD  azithromycin [facility]) 250 MG tablet Sig as  indicated 05/15/22   Georgina Quint, MD  cetirizine (ZYRTEC) 10 MG tablet Take 1 tablet (10 mg total) by mouth daily. 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   Seasonal allergies 03/31/2018   History of gastroesophageal reflux (GERD) 09/25/2017   Osteoarthritis of multiple joints 03/24/2017   Chronic pain of left knee 03/24/2017   Chronic obstructive pulmonary disease (HCC) 03/24/2017   Primary cancer of skin of left foot 02/03/2017   Mild intermittent asthma without complication 02/03/2017   Sleep apnea 02/03/2017   Dyslipidemia 02/03/2017   Migraine 02/03/2017   Essential hypertension 09/18/2008    Past Medical History:  Diagnosis Date   Allergy    Arthritis    Asthma    Blood transfusion without reported diagnosis    Cataract    Hyperlipidemia    Hypertension    Migraine    Osteoporosis    Sleep apnea     Past Surgical History:  Procedure Laterality Date   ABDOMINAL HYSTERECTOMY     CHOLECYSTECTOMY     EYE SURGERY Left    cataract    FOOT SURGERY Left     Social History   Socioeconomic History   Marital status: Widowed  Spouse name: Not on file   Number of children: Not on file   Years of education: Not on file   Highest education level: Not on file  Occupational History   Not on file  Tobacco Use   Smoking status: Never   Smokeless tobacco: Never  Vaping Use   Vaping status: Never Used  Substance and Sexual Activity   Alcohol use: No   Drug use: Never   Sexual activity: Not on file  Other Topics Concern   Not on file  Social History Narrative   Not on file   Social Determinants of Health   Financial Resource Strain: Not on file  Food Insecurity: Not on file  Transportation Needs:  Not on file  Physical Activity: Not on file  Stress: Not on file  Social Connections: Not on file  Intimate Partner Violence: Not on file    Family History  Problem Relation Age of Onset   Heart disease Mother    Hyperlipidemia Mother    Hypertension Mother    Stroke Mother    Heart disease Father    Stroke Father    Hypertension Son    Hyperlipidemia Son    Fibromyalgia Son    Lupus Son    Hypertension Son    Diabetes Daughter    Hypertension Daughter      Review of Systems  Constitutional: Negative.  Negative for chills and fever.  HENT: Negative.  Negative for congestion and sore throat.   Respiratory: Negative.  Negative for cough and shortness of breath.   Cardiovascular: Negative.  Negative for chest pain and palpitations.  Gastrointestinal:  Negative for abdominal pain, diarrhea, nausea and vomiting.  Musculoskeletal:  Positive for joint pain.  Skin:  Positive for itching. Negative for rash.  Neurological: Negative.  Negative for dizziness and headaches.  Psychiatric/Behavioral:  The patient has insomnia.   All other systems reviewed and are negative.   Vitals:   09/30/22 1015  BP: 130/80  Pulse: 89  Temp: 98.6 F (37 C)  SpO2: 95%    Physical Exam Vitals reviewed.  Constitutional:      Appearance: Normal appearance.  HENT:     Head: Normocephalic.  Eyes:     Extraocular Movements: Extraocular movements intact.  Cardiovascular:     Rate and Rhythm: Normal rate.  Pulmonary:     Effort: Pulmonary effort is normal.  Musculoskeletal:     Comments: Left knee: No significant swelling or tenderness.  Full range of motion.  Skin:    General: Skin is warm and dry.  Neurological:     Mental Status: She is alert and oriented to person, place, and time.  Psychiatric:        Mood and Affect: Mood normal.        Behavior: Behavior normal.      ASSESSMENT & PLAN: A total of 43 minutes was spent with the patient and counseling/coordination of care  regarding preparing for this visit, review of most recent office visit notes, review of multiple chronic medical conditions under management, review of all medications, need for orthopedic evaluation of left knee, pain management, treatment of insomnia and need to follow-up with sleep study specialist, prognosis, documentation and need for follow-up.  Problem List Items Addressed This Visit       Respiratory   Obstructive sleep apnea    Chronic and affecting quality of life Needs to reestablish care with sleep apnea specialist Needs new  equipment and supplies Needs reevaluation.  Referral  placed today.      Relevant Orders   Ambulatory referral to Sleep Studies     Other   Chronic pain of left knee - Primary    Active and affecting quality of life Pain management discussed Recommend sports medicine evaluation Referral placed today      Relevant Orders   Ambulatory referral to Sports Medicine   Chronic insomnia    Active and affecting quality of life Obstructive sleep apnea contributing Recommend Ambien 5 mg daily Sleep hygiene measures discussed      Relevant Medications   zolpidem (AMBIEN) 5 MG tablet   Patient Instructions  Dolor agudo de rodilla en los adultos Acute Knee Pain, Adult El dolor de rodilla puede tener muchas causas. A veces, el dolor de rodilla es repentino (agudo) y puede deberse a dao, hinchazn o irritacin de los msculos y tejidos que sostienen la rodilla. A menudo, el dolor desaparece solo con el tiempo y con reposo. Si el dolor no desaparece, pueden hacerse pruebas para hallar su causa. Siga estas instrucciones en su casa: Si tiene una rodillera o un dispositivo ortopdico:  Use la rodillera o el dispositivo ortopdico como se lo haya indicado el mdico. Quteselos solamente como se lo haya indicado el mdico. Afljeselos si los dedos del pie: [place]. Se adormecen. Se tornan fros y de [employer]. Mantngalos limpios. Si la rodillera o el  dispositivo ortopdico no son impermeables: No deje que se mojen. Cbralos con un envoltorio hermtico cuando tome un bao de inmersin o una ducha. Actividad Descanse la rodilla. No haga cosas que le causen dolor o que lo intensifiquen. Evite las actividades en las que ambos pies no estn en contacto con el piso al mismo tiempo (actividades de alto impacto). Algunos ejemplos son correr, Public relations account executive soga y hacer saltos de tijera. Trabaje con un fisioterapeuta para crear un programa de ejercicios seguros, como le haya indicado el mdico. Control del dolor, la rigidez y la hinchazn  Si se lo indican, aplique hielo sobre la rodilla. Para hacer esto: Si tiene una rodillera o un dispositivo ortopdico que se puede quitar, proceda como se lo haya indicado el mdico. Ponga el hielo en una bolsa plstica. Coloque una toalla entre la piel y Copy. Aplique el hielo durante 20 minutos, 2 o 3 veces por da. Retire el hielo si la piel se le pone de color rojo brillante. Esto es [employer]. Si no puede sentir dolor, calor o fro, tiene un mayor riesgo de que se dae la zona. Si se lo indican, use una venda elstica para ejercer presin (compresin) en la rodilla lesionada. Cuando est sentado o acostado, eleve la rodilla por encima del nivel del corazn. Duerma con una almohada debajo de la rodilla. Indicaciones generales Use los medicamentos de venta libre y los recetados solamente como se lo haya indicado el mdico. No fume ni consuma ningn producto que contenga nicotina o tabaco. Si necesita ayuda para dejar de consumir estos productos, consulte al mdico. Si tiene sobrepeso, trabaje con su mdico y un experto en alimentos (nutricionista) para establecer metas para bajar de peso. El sobrepeso puede aumentar el dolor de rodilla. Controle si hay algn cambio en sus sntomas. Cumpla con todas las visitas de seguimiento. Comunquese con un mdico si: El dolor de rodilla no desaparece. El dolor de  rodilla cambia o [place]. Tiene fiebre junto con dolor de rodilla. La rodilla est enrojecida o se siente caliente al tacto. La rodilla le falla o se le [place]  trabada. Solicite ayuda de inmediato si: La rodilla se le hincha, y la Barrister's clerk. No puede mover la rodilla. Tiene un dolor muy intenso en la rodilla que no se alivia con analgsicos. Resumen El dolor de rodilla puede tener muchas causas. A menudo, el dolor desaparece solo con el tiempo y con reposo. El mdico puede hacerle estudios para Financial risk analyst la causa del dolor. Controle si hay algn cambio en sus sntomas. [employer] dolor con descanso, medicamentos, actividad de poca intensidad y [place] de hielo. Solicite ayuda de inmediato si no puede mover la rodilla o si el dolor de rodilla es muy intenso. Esta informacin no tiene Theme park manager el consejo del mdico. Asegrese de hacerle al mdico cualquier pregunta que tenga. Document Revised: 09/20/2019 Document Reviewed: 09/20/2019 Elsevier Patient Education  2024 Elsevier Inc.     Edwina Barth, MD [facility]

## 2022-10-01 ENCOUNTER — Other Ambulatory Visit: Payer: Self-pay

## 2022-10-01 ENCOUNTER — Ambulatory Visit (INDEPENDENT_AMBULATORY_CARE_PROVIDER_SITE_OTHER): Payer: 59 | Admitting: Family Medicine

## 2022-10-01 ENCOUNTER — Encounter: Payer: Self-pay | Admitting: Family Medicine

## 2022-10-01 VITALS — BP 142/72 | HR 82 | Ht 61.0 in | Wt 163.0 lb

## 2022-10-01 DIAGNOSIS — G8929 Other chronic pain: Secondary | ICD-10-CM

## 2022-10-01 DIAGNOSIS — M25562 Pain in left knee: Secondary | ICD-10-CM | POA: Diagnosis not present

## 2022-10-01 NOTE — Progress Notes (Signed)
I, Stevenson Clinch, CMA acting as a scribe for Clementeen Graham, MD.  Dana Daniels is a 77 y.o. female who presents to [facility] at [facility] today with daughter and interpreter for chronic left knee pain. Pt was seen by PCP yesterday and referred for Sports Medicine for eval.   Today patient reports left knee pain x 1 month, no known injury. Locates pain to medial aspect and posterior aspect of the knee. Dx of OA. Swelling present intermittent. Mechanical sx present, denies fall related to knee sx. Ha sbeen wearing compression, Tiger Balm topical, Bengay, Icy Hot. Prescribed Prednisone about 2 weeks ago, took x 5 days; some relief with this but not long term.   Diagnostic Imaging: 08/21/22 XR left knee   Pertinent review of systems: No fevers or chills  Relevant historical information: Hypertension History of primary neoplasm blood vessel left foot  Exam:  BP (!) 142/72   Pulse 82   Ht 5\' 1"  (1.549 m)   Wt 163 lb (73.9 kg)   SpO2 96%   BMI 30.80 kg/m  General: Well Developed, well nourished, and in no acute distress.   MSK: Left knee mild effusion otherwise normal-appearing    Lab and Radiology Results  Procedure: Real-time Ultrasound Guided Injection of left knee joint superior lateral patella space Device: Philips Affiniti 50G/GE Logiq Images permanently stored and available for review in PACS Ultrasound evaluation prior to injection reveals mild joint effusion and mild degeneration medial joint line.  No Baker's cyst. Verbal informed consent obtained.  Discussed risks and benefits of procedure. Warned about infection, bleeding, hyperglycemia damage to structures among others. Patient expresses understanding and agreement Time-out conducted.   Noted no overlying erythema, induration, or other signs of local infection.   Skin prepped in a sterile fashion.   Local anesthesia: Topical Ethyl chloride.   With sterile technique and under real time  ultrasound guidance: 40 mg of Kenalog and 2 mL of Marcaine injected into knee joint. Fluid seen entering the joint capsule.   Completed without difficulty   Pain immediately resolved suggesting accurate placement of the medication.   Advised to call if fevers/chills, erythema, induration, drainage, or persistent bleeding.   Images permanently stored and available for review in the ultrasound unit.  Impression: Technically successful ultrasound guided injection.    EXAM: LEFT KNEE - COMPLETE 4 VIEW   COMPARISON:  None Available.   FINDINGS: No evidence of fracture, dislocation, or joint effusion. No evidence of arthropathy or other focal bone abnormality. Soft tissues are unremarkable.   IMPRESSION: Negative.     Electronically Signed   By: Allegra Lai M.D.   On: 08/21/2022 11:51 I, Clementeen Graham, personally (independently) visualized and performed the interpretation of the images attached in this note.    Assessment and Plan: 77 y.o. female with left knee pain thought to be due to degenerative medial meniscus tear or other degenerative changes not seen on recent x-ray.  Plan for steroid injection today.  She will let me know if this does not work well enough.  Recommend also Voltaren gel.  Check back as needed.  Can repeat this injection every 3 months if needed.   PDMP not reviewed this encounter. Orders Placed This Encounter  Procedures   [place] LIMITED JOINT SPACE STRUCTURES LOW LEFT(NO LINKED CHARGES)    Order Specific Question:   Reason for Exam (SYMPTOM  OR DIAGNOSIS REQUIRED)    Answer:   left knee pain    Order Specific  Question:   Preferred imaging location?    Answer:   [facility]   No orders of the defined types were placed in this encounter.    Discussed warning signs or symptoms. Please see discharge instructions. Patient expresses understanding.   The above documentation has been reviewed and is accurate and complete Clementeen Graham,  M.D.

## 2022-10-01 NOTE — Patient Instructions (Addendum)
Thank you for coming in today.  You received an injection today. Seek immediate medical attention if the joint becomes red, extremely painful, or is oozing fluid.  Let me know if this is not working.   We can do this same shot every 3 months.

## 2022-10-03 ENCOUNTER — Telehealth: Payer: Self-pay | Admitting: Emergency Medicine

## 2022-10-08 ENCOUNTER — Telehealth: Payer: Self-pay | Admitting: Emergency Medicine

## 2022-10-08 NOTE — Telephone Encounter (Signed)
Pt. dropped off forms for Dr. Alvy Bimler to fill out, from [facility].  I will place the form in the provider's box @ the [employer]. Please call Rose @ [phone]  Thanks!

## 2022-10-10 NOTE — Telephone Encounter (Signed)
Forms received will notify patient when they are completed and faxed

## 2022-10-15 NOTE — Telephone Encounter (Signed)
Forms completed, faxed confirmation received. Called patient family to pick up copy. Placed up front

## 2022-10-17 ENCOUNTER — Other Ambulatory Visit: Payer: Self-pay | Admitting: Emergency Medicine

## 2022-10-17 DIAGNOSIS — I10 Essential (primary) hypertension: Secondary | ICD-10-CM

## 2022-10-23 NOTE — Telephone Encounter (Signed)
Error Message

## 2022-10-29 ENCOUNTER — Telehealth: Payer: Self-pay | Admitting: Emergency Medicine

## 2022-10-29 NOTE — Telephone Encounter (Signed)
Pt reasonable accommodation verification form is being mailed out to pts address.

## 2022-11-05 ENCOUNTER — Ambulatory Visit (INDEPENDENT_AMBULATORY_CARE_PROVIDER_SITE_OTHER): Payer: 59

## 2022-11-05 VITALS — BP 130/70 | HR 82 | Temp 97.8°F | Ht 61.0 in | Wt 161.2 lb

## 2022-11-05 DIAGNOSIS — Z1211 Encounter for screening for malignant neoplasm of colon: Secondary | ICD-10-CM | POA: Diagnosis not present

## 2022-11-05 DIAGNOSIS — Z0000 Encounter for general adult medical examination without abnormal findings: Secondary | ICD-10-CM | POA: Diagnosis not present

## 2022-11-05 DIAGNOSIS — J449 Chronic obstructive pulmonary disease, unspecified: Secondary | ICD-10-CM

## 2022-11-05 MED ORDER — TRELEGY ELLIPTA 100-62.5-25 MCG/ACT IN AEPB
INHALATION_SPRAY | RESPIRATORY_TRACT | 3 refills | Status: DC
Start: 2022-11-05 — End: 2023-01-19

## 2022-11-05 NOTE — Patient Instructions (Signed)
Ms. Jayme Cloud Colon , Thank you for taking time to come for your Medicare Wellness Visit. I appreciate your ongoing commitment to your health goals. Please review the following plan we discussed and let me know if I can assist you in the future.   Referrals/Orders/Follow-Ups/Clinician Recommendations: No  This is a list of the screening recommended for you and due dates:  Health Maintenance  Topic Date Due   Cologuard (Stool DNA test)  07/17/2022   Flu Shot  10/02/2022   COVID-19 Vaccine (6 - 2023-24 season) 11/02/2022   Medicare Annual Wellness Visit  11/05/2023   Pneumonia Vaccine  Completed   DEXA scan (bone density measurement)  Completed   Hepatitis C Screening  Completed   Zoster (Shingles) Vaccine  Completed   HPV Vaccine  Aged Out   DTaP/Tdap/Td vaccine  Discontinued    Advanced directives: (Declined) Advance directive discussed with you today. Even though you declined this today, please call our office should you change your mind, and we can give you the proper paperwork for you to fill out.  Next Medicare Annual Wellness Visit scheduled for next year: No  Insert Preventive Care attachment Insert FALL PREVENTION attachment

## 2022-11-05 NOTE — Progress Notes (Addendum)
Subjective:   Dana Daniels is a 77 y.o. female who presents for an Initial Medicare Annual Wellness Visit.  Visit Complete: In person  Review of Systems     Cardiac Risk Factors include: advanced age (>39men, >55 women);dyslipidemia;hypertension;family history of premature cardiovascular disease;obesity (BMI >30kg/m2);sedentary lifestyle     Objective:    Today's Vitals   11/05/22 1039 11/05/22 1053  BP: 130/70   Pulse: 82   Temp: 97.8 F (36.6 C)   TempSrc: Temporal   SpO2: 91%   Weight: 161 lb 3.2 oz (73.1 kg)   Height: 5\' 1"  (1.549 m)   PainSc: 4  4   PainLoc: Knee    Body mass index is 30.46 kg/m.     11/05/2022   11:12 AM  Advanced Directives  Does Patient Have a Medical Advance Directive? No  Would patient like information on creating a medical advance directive? No - Patient declined    Current Medications (verified) Outpatient Encounter Medications as of 11/05/2022  Medication Sig   albuterol (VENTOLIN HFA) 108 (90 Base) MCG/ACT inhaler INHALE 2 PUFFS BY MOUTH EVERY 6 HOURS AS NEEDED FOR WHEEZING FOR SHORTNESS OF BREATH   azithromycin (ZITHROMAX) 250 MG tablet Sig as indicated   CARTIA XT 240 MG 24 hr capsule Take 1 capsule by mouth once daily   cetirizine (ZYRTEC) 10 MG tablet Take 1 tablet (10 mg total) by mouth daily.   cromolyn (NASALCROM) 5.2 MG/ACT nasal spray Place 1 spray into both nostrils 2 (two) times daily.   Fluticasone-Umeclidin-Vilant (TRELEGY ELLIPTA) 100-62.5-25 MCG/ACT AEPB Inhale 1 puff by mouth once daily   montelukast (SINGULAIR) 10 MG tablet Take 1 tablet (10 mg total) by mouth at bedtime.   nitroGLYCERIN (NITROSTAT) 0.4 MG SL tablet DISSOLVE ONE TABLET UNDER THE TONGUE EVERY 5 MINUTES AS NEEDED FOR CHEST PAIN.  DO NOT EXCEED A TOTAL OF 3 DOSES IN 15 MINUTES   omeprazole (PRILOSEC) 40 MG capsule Take 1 capsule (40 mg total) by mouth daily.   simvastatin (ZOCOR) 20 MG tablet Take 1 tablet (20 mg total) by mouth daily.    Ubrogepant (UBRELVY) 100 MG TABS Take 100 mg by mouth daily as needed. Take at onset of migraine headache   zolpidem (AMBIEN) 5 MG tablet Take 1 tablet (5 mg total) by mouth at bedtime as needed for sleep.   [DISCONTINUED] Fluticasone-Umeclidin-Vilant (TRELEGY ELLIPTA) 100-62.5-25 MCG/ACT AEPB Inhale 1 puff by mouth once daily   No facility-administered encounter medications on file as of 11/05/2022.    Allergies (verified) Tramadol   History: Past Medical History:  Diagnosis Date   Allergy    Arthritis    Asthma    Blood transfusion without reported diagnosis    Cataract    Hyperlipidemia    Hypertension    Migraine    Osteoporosis    Sleep apnea    Past Surgical History:  Procedure Laterality Date   ABDOMINAL HYSTERECTOMY     CHOLECYSTECTOMY     EYE SURGERY Left    cataract    FOOT SURGERY Left    Family History  Problem Relation Age of Onset   Heart disease Mother    Hyperlipidemia Mother    Hypertension Mother    Stroke Mother    Heart disease Father    Stroke Father    Hypertension Son    Hyperlipidemia Son    Fibromyalgia Son    Lupus Son    Hypertension Son    Diabetes Daughter    Hypertension  Daughter    Social History   Socioeconomic History   Marital status: Widowed    Spouse name: Not on file   Number of children: Not on file   Years of education: Not on file   Highest education level: Not on file  Occupational History   Not on file  Tobacco Use   Smoking status: Never   Smokeless tobacco: Never  Vaping Use   Vaping status: Never Used  Substance and Sexual Activity   Alcohol use: No   Drug use: Never   Sexual activity: Not on file  Other Topics Concern   Not on file  Social History Narrative   Not on file   Social Determinants of Health   Financial Resource Strain: Low Risk  (11/05/2022)   Overall Financial Resource Strain (CARDIA)    Difficulty of Paying Living Expenses: Not hard at all  Food Insecurity: No Food Insecurity  (11/05/2022)   Hunger Vital Sign    Worried About Running Out of Food in the Last Year: Never true    Ran Out of Food in the Last Year: Never true  Transportation Needs: No Transportation Needs (11/05/2022)   PRAPARE - Administrator, Civil Service (Medical): No    Lack of Transportation (Non-Medical): No  Physical Activity: Patient Declined (11/05/2022)   Exercise Vital Sign    Days of Exercise per Week: Patient declined    Minutes of Exercise per Session: Patient declined  Stress: No Stress Concern Present (11/05/2022)   [employer] of Occupational Health - Occupational Stress Questionnaire    Feeling of Stress : Not at all  Social Connections: Unknown (11/05/2022)   Social Connection and Isolation Panel [NHANES]    Frequency of Communication with Friends and Family: More than three times a week    Frequency of Social Gatherings with Friends and Family: More than three times a week    Attends Religious Services: Patient declined    Database administrator or Organizations: Patient declined    Attends Banker Meetings: Patient declined    Marital Status: Widowed    Tobacco Counseling Counseling given: Not Answered   Clinical Intake:  Pre-visit preparation completed: Yes  Pain : 0-10 Pain Score: 4  Pain Type: Chronic pain Pain Location: Knee Pain Orientation: Left     BMI - recorded: 30.46 Nutritional Status: BMI > 30  Obese Nutritional Risks: None Diabetes: No CBG done?: No Did pt. bring in CBG monitor from home?: No  How often do you need to have someone help you when you read instructions, pamphlets, or other written materials from your doctor or pharmacy?: 1 - Never What is the last grade level you completed in school?: College  Interpreter Needed?: Yes Interpreter Agency: [facility] Interpreter Name: Mancel Bale Patient Declined Interpreter : No  Information entered by :: Susie Cassette, LPN.   Activities of Daily Living     11/05/2022   11:20 AM  In your present state of health, do you have any difficulty performing the following activities:  Hearing? 0  Vision? 0  Difficulty concentrating or making decisions? 0  Walking or climbing stairs? 0  Dressing or bathing? 0  Doing errands, shopping? 0  Preparing Food and eating ? N  Using the Toilet? N  In the past six months, have you accidently leaked urine? N  Do you have problems with loss of bowel control? N  Managing your Medications? N  Managing your Finances? N  Housekeeping or  managing your Housekeeping? N    Patient Care Team: Georgina Quint, MD as PCP - General (Internal Medicine) Prudencio Burly as Consulting Physician (Optometry)  Indicate any recent Medical Services you may have received from other than [facility] providers in the past year (date may be approximate).     Assessment:   This is a routine wellness examination for Dana Daniels.  Hearing/Vision screen Hearing Screening - Comments:: Denies hearing difficulties; no hearing aids.  Vision Screening - Comments:: Wears rx glasses - up to date with routine eye exams with Prudencio Burly   Dietary issues and exercise activities discussed:     Goals Addressed             This Visit's Progress    Client understands the importance of follow-up with providers by attending scheduled visits        Depression Screen    11/05/2022   11:10 AM 09/30/2022   10:16 AM 08/21/2022   11:01 AM 08/05/2021   10:08 AM 04/17/2021    8:13 AM 12/27/2020   11:35 AM 10/30/2020    9:41 AM  PHQ 2/9 Scores  PHQ - 2 Score 0 0 0 0 0 0 0  PHQ- 9 Score 3          Fall Risk    11/05/2022   10:40 AM 09/30/2022   10:16 AM 08/21/2022   11:01 AM 08/05/2021   10:08 AM 04/17/2021    8:13 AM  Fall Risk   Falls in the past year? 0 0 0 0 0  Number falls in past yr: 0 0 0  0  Injury with Fall? 0 0 0  0  Risk for fall due to : No Fall Risks No Fall Risks No Fall Risks    Follow up Falls prevention discussed Falls  evaluation completed Falls evaluation completed      MEDICARE RISK AT HOME: Medicare Risk at Home Any stairs in or around the home?: No (moving to 1st floor apartment next month) If so, are there any without handrails?: No Home free of loose throw rugs in walkways, pet beds, electrical cords, etc?: Yes Adequate lighting in your home to reduce risk of falls?: Yes Life alert?: No Use of a cane, walker or w/c?: No Grab bars in the bathroom?: No Shower chair or bench in shower?: No Elevated toilet seat or a handicapped toilet?: No  TIMED UP AND GO:  Was the test performed? Yes  Length of time to ambulate 10 feet: 8 sec Gait steady and fast without use of assistive device    Cognitive Function: Normal cognitive status assessed by direct observation by this Nurse Health Advisor. No abnormalities found.         11/05/2022   11:22 AM  6CIT Screen  What Year? 0 points  What month? 0 points  What time? 0 points  Count back from 20 0 points  Months in reverse 0 points  Repeat phrase 0 points  Total Score 0 points    Immunizations Immunization History  Administered Date(s) Administered   Fluad Quad(high Dose 65+) 11/16/2018, 12/01/2019, 12/12/2021   Influenza, High Dose Seasonal PF 11/09/2017   Influenza-Unspecified 12/17/2016, 03/02/2021, 10/31/2022   PFIZER(Purple Top)SARS-COV-2 Vaccination 05/03/2019, 05/31/2019, 12/09/2019, 06/29/2020   PNEUMOCOCCAL CONJUGATE-20 12/27/2020   Pfizer Covid-19 Vaccine Bivalent Booster 46yrs & up 12/16/2020   Pneumococcal-Unspecified 12/17/2016   RSV,unspecified 11/23/2021   Zoster Recombinant(Shingrix) 01/31/2018, 05/05/2018    TDAP status: up to date   Flu Vaccine status: Up to  date  Pneumococcal vaccine status: Up to date  Covid-19 vaccine status: Completed vaccines  Qualifies for Shingles Vaccine? Yes   Zostavax completed No   Shingrix Completed?: Yes  Screening Tests Health Maintenance  Topic Date Due   Fecal DNA (Cologuard)   07/17/2022   COVID-19 Vaccine (6 - 2023-24 season) 11/02/2022   Medicare Annual Wellness (AWV)  11/05/2023   Pneumonia Vaccine 28+ Years old  Completed   INFLUENZA VACCINE  Completed   DEXA SCAN  Completed   Hepatitis C Screening  Completed   Zoster Vaccines- Shingrix  Completed   HPV VACCINES  Aged Out   DTaP/Tdap/Td  Discontinued    Health Maintenance  Health Maintenance Due  Topic Date Due   Fecal DNA (Cologuard)  07/17/2022   COVID-19 Vaccine (6 - 2023-24 season) 11/02/2022    Colorectal cancer screening: Type of screening: Cologuard. Completed 07/25/2019. Repeat every 3 years Ordered 11/05/2022.  Mammogram status: Completed 02/10/2022. Repeat every year  Bone Density status: Completed 12/01/2016. Results reflect: Bone density results: OSTEOPOROSIS. Repeat every 2 years.   Lung Cancer Screening: (Low Dose CT Chest recommended if Age 35-80 years, 20 pack-year currently smoking OR have quit w/in 15years.) does not qualify.   Lung Cancer Screening Referral: no  Additional Screening:  Hepatitis C Screening: does qualify; Completed 11/06/2020  Vision Screening: Recommended annual ophthalmology exams for early detection of glaucoma and other disorders of the eye. Is the patient up to date with their annual eye exam?  Yes  Who is the provider or what is the name of the office in which the patient attends annual eye exams? Prudencio Burly If pt is not established with a provider, would they like to be referred to a provider to establish care? No .   Dental Screening: Recommended annual dental exams for proper oral hygiene  Diabetic Foot Exam: N/A  Community Resource Referral / Chronic Care Management: CRR required this visit?  No   CCM required this visit?  No     Plan:     I have personally reviewed and noted the following in the patient's chart:   Medical and social history Use of alcohol, tobacco or illicit drugs  Current medications and supplements including opioid  prescriptions. Patient is not currently taking opioid prescriptions. Functional ability and status Nutritional status Physical activity Advanced directives List of other physicians Hospitalizations, surgeries, and ER visits in previous 12 months Vitals Screenings to include cognitive, depression, and falls Referrals and appointments  In addition, I have reviewed and discussed with patient certain preventive protocols, quality metrics, and best practice recommendations. A written personalized care plan for preventive services as well as general preventive health recommendations were provided to patient.     Mickeal Needy, LPN   03/08/1094   After Visit Summary: Printed and given to patient.  Nurse Notes: Normal cognitive status assessed by direct observation by this Nurse Health Advisor. No abnormalities found.

## 2022-11-10 ENCOUNTER — Encounter: Payer: Self-pay | Admitting: *Deleted

## 2022-11-11 ENCOUNTER — Encounter: Payer: Self-pay | Admitting: Neurology

## 2022-11-11 ENCOUNTER — Ambulatory Visit (INDEPENDENT_AMBULATORY_CARE_PROVIDER_SITE_OTHER): Payer: 59 | Admitting: Neurology

## 2022-11-11 VITALS — BP 151/65 | HR 82 | Ht 61.0 in | Wt 159.0 lb

## 2022-11-11 DIAGNOSIS — G4700 Insomnia, unspecified: Secondary | ICD-10-CM

## 2022-11-11 DIAGNOSIS — G4733 Obstructive sleep apnea (adult) (pediatric): Secondary | ICD-10-CM | POA: Diagnosis not present

## 2022-11-11 DIAGNOSIS — R519 Headache, unspecified: Secondary | ICD-10-CM

## 2022-11-11 DIAGNOSIS — R635 Abnormal weight gain: Secondary | ICD-10-CM | POA: Diagnosis not present

## 2022-11-11 NOTE — Progress Notes (Signed)
Subjective:    Patient ID: Dana Daniels is a 77 y.o. female.  HPI    Dana Foley, MD, PhD [facility] [address] [address] [place], [place] [address]  Dear Dr. Alvy Daniels,   I saw your patient, Dana Daniels, upon your kind request in my neurologic clinic today for initial consultation of her sleep disorder, in particular, evaluation of her prior diagnosis of obstructive sleep apnea.  The patient is unaccompanied today.  We utilized the remote video interpreter for this appointment, Spanish interpreter was Dana Daniels. As you know, Dana Daniels is a 77 year old female with an underlying medical history of allergies, arthritis, asthma, hypertension, hyperlipidemia, migraine headaches, osteoporosis, and mild obesity, who was previously diagnosed with obstructive sleep apnea and placed on PAP therapy.  She has an Epworth sleepiness score of 1/24, fatigue severity score is 14 out of 63.  She reports that she does not sleep well.  She has benefited from PAP therapy but has not used it in the past several months.  I was able to review her AutoPap compliance data for the past year, she used her machine reasonably consistently up until March of this year, very sporadically through June of this year but none since July of this year.  She has not had regular supplies, she uses nasal pillows, she actually benefited from treatment when she first started AutoPap therapy some 5 years ago.  She does not currently have a DME company.  It looks like she got her machine from [facility].  She does not have a sleep specialist currently.  She does not sleep well and has chronic difficulty initiating and maintaining sleep, essentially since her husband passed away 29 years ago.  He had multiple strokes and was in a coma before he passed.  She has 3 grown children, 1 daughter and 2 sons, youngest son is 22.  She is retired since 2009.  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  She is a non-smoker and does not drink alcohol.  She drinks quite a bit of caffeine in the form of black tea, 3 to 4 cups/day and at night she will drink coffee with milk.  Bedtime is 1 or 2 AM and rise time somewhere between 6 and 7 AM.  She does not have nightly nocturia.  She does have occasional morning headaches.  Over the course of 20 years she had weight gain in the realm of 40 pounds.  She has been working on weight loss and has lost about 10 pounds in the past month and a half.  She had a tonsillectomy as a child. She had a home sleep test on 09/07/2017, study was interpreted by Dr. Cyril Daniels.  Her AHI was 12.1/h, average oxygen saturation 93%, nadir was 82%.  She takes Ambien as needed, 5 mg strength, on average maybe once a week.  Her Past Medical History Is Significant For: Past Medical History:  Diagnosis Date   Allergy    Arthritis    Asthma    Blood transfusion without reported diagnosis    Cataract    Hyperlipidemia    Hypertension    Migraine    Osteoporosis    Sleep apnea     Her Past Surgical History Is Significant For: Past Surgical History:  Procedure Laterality Date   ABDOMINAL HYSTERECTOMY     CHOLECYSTECTOMY     EYE SURGERY Left    cataract    FOOT SURGERY Left  Her Family History Is Significant For: Family History  Problem Relation Age of Onset   Heart disease Mother    Hyperlipidemia Mother    Hypertension Mother    Stroke Mother    Heart disease Father    Stroke Father    Diabetes Daughter    Hypertension Daughter    Hypertension Son    Hyperlipidemia Son    Fibromyalgia Son    Lupus Son    Sleep apnea Son    Hypertension Son    Sleep apnea Son     Her Social History Is Significant For: Social History   Socioeconomic History   Marital status: Widowed    Spouse name: Not on file   Number of children: Not on file   Years of education: Not on file   Highest education level: Not on file  Occupational History    Not on file  Tobacco Use   Smoking status: Never   Smokeless tobacco: Never  Vaping Use   Vaping status: Never Used  Substance and Sexual Activity   Alcohol use: No   Drug use: Never   Sexual activity: Not on file  Other Topics Concern   Not on file  Social History Narrative   Not on file   Social Determinants of Health   Financial Resource Strain: Low Risk  (11/05/2022)   Overall Financial Resource Strain (CARDIA)    Difficulty of Paying Living Expenses: Not hard at all  Food Insecurity: No Food Insecurity (11/05/2022)   Hunger Vital Sign    Worried About Running Out of Food in the Last Year: Never true    Ran Out of Food in the Last Year: Never true  Transportation Needs: No Transportation Needs (11/05/2022)   PRAPARE - Administrator, Civil Service (Medical): No    Lack of Transportation (Non-Medical): No  Physical Activity: Patient Declined (11/05/2022)   Exercise Vital Sign    Days of Exercise per Week: Patient declined    Minutes of Exercise per Session: Patient declined  Stress: No Stress Concern Present (11/05/2022)   [employer] of Occupational Health - Occupational Stress Questionnaire    Feeling of Stress : Not at all  Social Connections: Unknown (11/05/2022)   Social Connection and Isolation Panel [NHANES]    Frequency of Communication with Friends and Family: More than three times a week    Frequency of Social Gatherings with Friends and Family: More than three times a week    Attends Religious Services: Patient declined    Database administrator or Organizations: Patient declined    Attends Banker Meetings: Patient declined    Marital Status: Widowed    Her Allergies Are:  Allergies  Allergen Reactions   Tramadol Itching  :   Her Current Medications Are:  Outpatient Encounter Medications as of 11/11/2022  Medication Sig   albuterol (VENTOLIN HFA) 108 (90 Base) MCG/ACT inhaler INHALE 2 PUFFS BY MOUTH EVERY 6 HOURS AS NEEDED FOR  WHEEZING FOR SHORTNESS OF BREATH   azithromycin (ZITHROMAX) 250 MG tablet Sig as indicated   CARTIA XT 240 MG 24 hr capsule Take 1 capsule by mouth once daily   cromolyn (NASALCROM) 5.2 MG/ACT nasal spray Place 1 spray into both nostrils 2 (two) times daily.   Fluticasone-Umeclidin-Vilant (TRELEGY ELLIPTA) 100-62.5-25 MCG/ACT AEPB Inhale 1 puff by mouth once daily   montelukast (SINGULAIR) 10 MG tablet Take 1 tablet (10 mg total) by mouth at bedtime.   nitroGLYCERIN (NITROSTAT) 0.4 MG  SL tablet DISSOLVE ONE TABLET UNDER THE TONGUE EVERY 5 MINUTES AS NEEDED FOR CHEST PAIN.  DO NOT EXCEED A TOTAL OF 3 DOSES IN 15 MINUTES   omeprazole (PRILOSEC) 40 MG capsule Take 1 capsule (40 mg total) by mouth daily.   simvastatin (ZOCOR) 20 MG tablet Take 1 tablet (20 mg total) by mouth daily.   Ubrogepant (UBRELVY) 100 MG TABS Take 100 mg by mouth daily as needed. Take at onset of migraine headache   zolpidem (AMBIEN) 5 MG tablet Take 1 tablet (5 mg total) by mouth at bedtime as needed for sleep.   cetirizine (ZYRTEC) 10 MG tablet Take 1 tablet (10 mg total) by mouth daily.   No facility-administered encounter medications on file as of 11/11/2022.  :   Review of Systems:  Out of a complete 14 point review of systems, all are reviewed and negative with the exception of these symptoms as listed below:          Review of Systems  Neurological:        Pt here for sleep consult   Pt snores,migraines,fatigue ,Asthma,COPD ,hypertension . Pt hasn't used CPAP in 2 months  Pt states has had CPAP machine for 4 years Pt states sleep study 5 years ago    ESS: 1  FSS: 14    Objective:  Neurological Exam  Physical Exam Physical Examination:   Vitals:   11/11/22 1127  BP: (!) 151/65  Pulse: 82    General Examination: The patient is a very pleasant 77 y.o. female in no acute distress. She appears well-developed and well-nourished and well groomed.   HEENT: Normocephalic, atraumatic, pupils are  equal, round and reactive to light, corrective eyeglasses in place.  Extraocular tracking is good without limitation to gaze excursion or nystagmus noted. Hearing is grossly intact. Face is symmetric with normal facial animation. Speech is clear with no dysarthria noted. There is no hypophonia. There is no lip, neck/head, jaw or voice tremor. Neck is supple with full range of passive and active motion. There are no carotid bruits on auscultation. Oropharynx exam reveals: mild mouth dryness, adequate dental hygiene and mild airway crowding, due to small airway entry.  Mallampati class II.  Tonsils absent.  Neck circumference 15-1/8 inches, minimal overbite noted.  Tongue protrudes centrally and palate elevates symmetrically.  Chest: Clear to auscultation without wheezing, rhonchi or crackles noted.  Heart: S1+S2+0, regular and normal without murmurs, rubs or gallops noted.   Abdomen: Soft, non-tender and non-distended.  Extremities: There is no obvious swelling in the distal lower extremities bilaterally.   Skin: Warm and dry without trophic changes noted.   Musculoskeletal: exam reveals no obvious joint deformities.   Neurologically:  Mental status: The patient is awake, alert and oriented in all 4 spheres. Her immediate and remote memory, attention, language skills and fund of knowledge are appropriate. There is no evidence of aphasia, agnosia, apraxia or anomia. Speech is clear with normal prosody and enunciation. Thought process is linear. Mood is normal and affect is normal.  Cranial nerves II - XII are as described above under HEENT exam.  Motor exam: Normal bulk, strength and tone is noted. There is no obvious action or resting tremor.  Fine motor skills and coordination: grossly intact.  Cerebellar testing: No dysmetria or intention tremor. There is no truncal or gait ataxia.  Sensory exam: intact to light touch in the upper and lower extremities.  Gait, station and balance: She stands  easily. No veering to one  side is noted. No leaning to one side is noted. Posture is age-appropriate and stance is narrow based. Gait shows normal stride length and normal pace. No problems turning are noted.   Assessment and Plan:  In summary, Shatora Haji Daniels is a very pleasant 77 y.o.-year old female 77 year old female with an underlying medical history of allergies, arthritis, asthma, hypertension, hyperlipidemia, migraine headaches, osteoporosis, and mild obesity, who was previously diagnosed with obstructive sleep apnea and placed on PAP therapy.  She benefited from PAP therapy but has not used her machine consistently in the past several months, she has not had regular supplies and does not currently have a DME provider.  She should be eligible for a new machine as it has been over 5 years since her previous sleep test at home.  She was diagnosed with mild obstructive sleep apnea in July 2019.  She would be willing to get retested.  She has been working on weight loss.  We talked about the importance of maintaining a set schedule and good sleep hygiene.  We talked about potential alternative treatment options for sleep apnea including surgical and and dental treatment options.  She would be willing to maintain treatment with an autoPAP machine.  We will proceed at this time with a home sleep test.  I explained the test procedure to the patient in detail.  We will call her with her test results and based on the results I will likely prescribe a new AutoPap machine and we can establish her with a local DME provider at the time.  We will plan a follow-up in sleep clinic accordingly.  I answered all her questions today and she was in agreement. Thank you very much for allowing me to participate in the care of this nice patient. If I can be of any further assistance to you please do not hesitate to call me at [phone].  Sincerely,   Dana Foley, MD, PhD

## 2022-11-11 NOTE — Patient Instructions (Signed)
It was nice to meet you today.  As discussed, I will order a home sleep test for re-evaluation of your sleep apnea, as testing was over 5 years ago. The home sleep test is done (HST) to re-establish/confirm the sleep apnea diagnosis and to get your a new machine through the insurance. Our sleep lab staff will reach out to you to arrange for pickup and for tutorial of the test equipment. I will write for a new machine after the HST confirms the OSA (obstructive sleep apnea) diagnosis. Please remember, you will not use your AutoPap machine during the night of testing so we get diagnostic data, not treatment data. We will schedule a follow-up appointment after the new machine set-up date, typically within 31 to 89 days post treatment start.  Per insurance requirement, you will need to show compliance with usage and fulfill a minimum usage percentage.

## 2022-11-25 ENCOUNTER — Telehealth: Payer: Self-pay | Admitting: Neurology

## 2022-11-25 NOTE — Telephone Encounter (Signed)
HST 11/11/22 [facility] [place] 11/11/22 [employer] medicare/medicaid no auth req EE

## 2022-12-02 NOTE — Progress Notes (Unsigned)
   Rubin Payor, PhD, LAT, ATC acting as a scribe for Clementeen Graham, MD.  Shaterrica Territo Colon is a 77 y.o. female who presents to [facility] today for L knee pain x ongoing for a few month. No injury or fall. Pt locates pain to the anterior medial aspect of her L knee.   L Knee swelling: no Mechanical symptoms:  no Aggravates: stairs, knee flexion,  Treatments tried: Tylenol, creams, ice, patches  Dx imaging: 08/21/22 L knee & L-spine XR  Pertinent review of systems: No fevers or chills  Relevant historical information: Hypertension.   Exam:  BP 130/76   Pulse 95   Ht 5\' 1"  (1.549 m)   Wt 161 lb (73 kg)   SpO2 96%   BMI 30.42 kg/m  General: Well Developed, well nourished, and in no acute distress.   MSK: Left knee normal-appearing Normal motion. Mildly tender palpation medial joint line. Stable ligamentous exam.  Lab and Radiology Results   EXAM: LEFT KNEE - COMPLETE 4 VIEW   COMPARISON:  None Available.   FINDINGS: No evidence of fracture, dislocation, or joint effusion. No evidence of arthropathy or other focal bone abnormality. Soft tissues are unremarkable.   IMPRESSION: Negative.     Electronically Signed   By: Allegra Lai M.D.   On: 08/21/2022 11:51 I, Clementeen Graham, personally (independently) visualized and performed the interpretation of the images attached in this note.    Assessment and Plan: 77 y.o. female with left knee pain due to exacerbation of DJD.  She was seen about 2 months ago and had steroid injection which worked pretty well.  She still having some pain.  We talked about how to maximize conservative management and have decided on physical therapy.  Also recommend Tylenol and Voltaren gel.  Will work on authorization for hyaluronic acid injections and Zilretta injections.  However plan a recheck in about a month.  At that time could do conventional steroid injections or the second line injections  listed above.  Visit conducted using a Spanish interpreter.   PDMP not reviewed this encounter. Orders Placed This Encounter  Procedures   Ambulatory referral to Physical Therapy    Referral Priority:   Routine    Referral Type:   Physical Medicine    Referral Reason:   Specialty Services Required    Requested Specialty:   Physical Therapy    Number of Visits Requested:   1   No orders of the defined types were placed in this encounter.    Discussed warning signs or symptoms. Please see discharge instructions. Patient expresses understanding.   The above documentation has been reviewed and is accurate and complete Clementeen Graham, M.D.

## 2022-12-03 ENCOUNTER — Other Ambulatory Visit: Payer: Self-pay

## 2022-12-03 ENCOUNTER — Ambulatory Visit: Payer: 59 | Admitting: Family Medicine

## 2022-12-03 VITALS — BP 130/76 | HR 95 | Ht 61.0 in | Wt 161.0 lb

## 2022-12-03 DIAGNOSIS — M25562 Pain in left knee: Secondary | ICD-10-CM | POA: Diagnosis not present

## 2022-12-03 DIAGNOSIS — G8929 Other chronic pain: Secondary | ICD-10-CM | POA: Diagnosis not present

## 2022-12-03 NOTE — Patient Instructions (Addendum)
Thank you for coming in today.   I've referred you to Physical Therapy.  Let us know if you don't hear from them in one week.   Please use Voltaren gel (Generic Diclofenac Gel) up to 4x daily for pain as needed.  This is available over-the-counter as both the name brand Voltaren gel and the generic diclofenac gel.   We will work to authorize gel shots and Zilretta  Check back in 1 month

## 2022-12-04 ENCOUNTER — Telehealth: Payer: Self-pay

## 2022-12-04 DIAGNOSIS — M1712 Unilateral primary osteoarthritis, left knee: Secondary | ICD-10-CM

## 2022-12-04 DIAGNOSIS — G8929 Other chronic pain: Secondary | ICD-10-CM

## 2022-12-04 NOTE — Telephone Encounter (Signed)
-----   Message from Adron Bene sent at 12/03/2022 10:42 AM EDT ----- Regarding: auth Please work to Neurosurgeon for L knee

## 2022-12-04 NOTE — Telephone Encounter (Signed)
VOB initiated for GELSYN for LEFT knee OA.

## 2022-12-04 NOTE — Telephone Encounter (Signed)
VOB initiated for Zilretta for LEFT knee OA 

## 2022-12-05 ENCOUNTER — Other Ambulatory Visit: Payer: Self-pay | Admitting: Emergency Medicine

## 2022-12-05 DIAGNOSIS — Z1211 Encounter for screening for malignant neoplasm of colon: Secondary | ICD-10-CM

## 2022-12-05 DIAGNOSIS — Z1212 Encounter for screening for malignant neoplasm of rectum: Secondary | ICD-10-CM

## 2022-12-05 NOTE — Telephone Encounter (Signed)
Zilretta for LEFT knee OA   Primary Insurance: [facility] Medicare Dual Complete Co-pay: n/a Co-insurance: 20%  Deductible: $240 of $240 met Prior Auth: NOT required   Knee Injection History 10/01/22 - Kenalog

## 2022-12-05 NOTE — Telephone Encounter (Signed)
No Prior Auth required for [employer]

## 2022-12-05 NOTE — Telephone Encounter (Signed)
NO Prior Auth required for [employer]

## 2022-12-05 NOTE — Telephone Encounter (Signed)
GELSYN for LEFT knee OA  (Also checking Zilretta)  Primary Insurance: [employer]HC Dual Complete HMO Co-pay: n/a Co-insurance: 20% Deductible: $240 of $240 met Prior Auth: NOT required   Knee Injection History 10/01/22 - Kenalog

## 2022-12-09 NOTE — Telephone Encounter (Signed)
Scheduled for 11/6.

## 2022-12-09 NOTE — Telephone Encounter (Signed)
Scheduled 1/16

## 2022-12-10 ENCOUNTER — Ambulatory Visit: Payer: 59 | Admitting: Emergency Medicine

## 2022-12-11 ENCOUNTER — Encounter: Payer: Self-pay | Admitting: Emergency Medicine

## 2022-12-11 ENCOUNTER — Encounter: Payer: Self-pay | Admitting: Neurology

## 2022-12-12 ENCOUNTER — Other Ambulatory Visit: Payer: Self-pay | Admitting: *Deleted

## 2022-12-12 DIAGNOSIS — Z8669 Personal history of other diseases of the nervous system and sense organs: Secondary | ICD-10-CM

## 2022-12-12 MED ORDER — UBRELVY 100 MG PO TABS: 100.0000 mg | ORAL_TABLET | Freq: Every day | ORAL | 1 refills | Status: DC | PRN

## 2022-12-15 ENCOUNTER — Ambulatory Visit: Payer: 59 | Admitting: Emergency Medicine

## 2022-12-15 NOTE — Telephone Encounter (Signed)
Patient needs a PA for [employer]

## 2022-12-16 NOTE — Therapy (Unsigned)
OUTPATIENT PHYSICAL THERAPY LOWER EXTREMITY EVALUATION   Patient Name: Dana Daniels MRN: [id number] DOB:04-Feb-1946, 77 y.o., female Today's Date: 12/17/2022  END OF SESSION:  PT End of Session - 12/17/22 1438     Visit Number 1    Date for PT Re-Evaluation 02/25/23    PT Start Time 1103    PT Stop Time 1140    PT Time Calculation (min) 37 min    Activity Tolerance Patient tolerated treatment well    Behavior During Therapy WFL for tasks assessed/performed             Past Medical History:  Diagnosis Date   Allergy    Arthritis    Asthma    Blood transfusion without reported diagnosis    Cataract    Hyperlipidemia    Hypertension    Migraine    Osteoporosis    Sleep apnea    Past Surgical History:  Procedure Laterality Date   ABDOMINAL HYSTERECTOMY     CHOLECYSTECTOMY     EYE SURGERY Left    cataract    FOOT SURGERY Left    Patient Active Problem List   Diagnosis Date Noted   Chronic insomnia 09/30/2022   Allergic dermatitis 08/21/2022   Bilateral foot pain 08/05/2021   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   Seasonal allergies 03/31/2018   History of gastroesophageal reflux (GERD) 09/25/2017   Osteoarthritis of multiple joints 03/24/2017   Chronic pain of left knee 03/24/2017   Chronic obstructive pulmonary disease (HCC) 03/24/2017   Primary cancer of skin of left foot 02/03/2017   Mild intermittent asthma without complication 02/03/2017   Obstructive sleep apnea 02/03/2017   Dyslipidemia 02/03/2017   Migraine 02/03/2017   Essential hypertension 09/18/2008    PCP: Georgina Quint, MD   REFERRING PROVIDER: Rodolph Bong, MD  REFERRING DIAG: [phone] (ICD-10-CM) - Chronic pain of left knee   THERAPY DIAG:  Abnormal posture  Difficulty in walking, not elsewhere classified  Chronic pain of left knee  Muscle weakness (generalized)  Unsteadiness on feet  Rationale for Evaluation and  Treatment: Rehabilitation  ONSET DATE: 12/03/22  SUBJECTIVE:   SUBJECTIVE STATEMENT: Patient reports that her L knee has chronic pain. She received injections 3 months ago, but they did not help. She lives on the second floor and has a lot of pain on the steps. She has a cancerous growth removed from her L foot in 2019. Had knee pain before this, it has increased.  PERTINENT HISTORY: Per referring physician note: 77 y.o. female with left knee pain due to exacerbation of DJD.  She was seen about 2 months ago and had steroid injection which worked pretty well.  She still having some pain.  We talked about how to maximize conservative management and have decided on physical therapy.  Also recommend Tylenol and Voltaren gel.  Will work on authorization for hyaluronic acid injections and Zilretta injections.  However plan a recheck in about a month.  At that time could do conventional steroid injections or the second line injections listed above.  Visit conducted using a Spanish interpreter.  PAIN:  Are you having pain? Yes: NPRS scale: 6/10 Pain location: L knee Pain description: tingling Aggravating factors: steps, having to walk while carrying weight Relieving factors: Tylenol, sometimes heat or ice helps  PRECAUTIONS: Fall  RED FLAGS: None   WEIGHT BEARING RESTRICTIONS: No  FALLS:  Has patient fallen in last 6 months? No  LIVING ENVIRONMENT:  Lives with: lives alone Lives in: House/apartment Stairs: Yes: External: 14 steps; bilateral but cannot reach both Plans to move to first floor Has following equipment at home: Single point cane only uses when her knee hurts  OCCUPATION: N/A, when the pain is worse, it impedes her.  PLOF: Independent  PATIENT GOALS: Patient would like to get better.  NEXT MD VISIT: unknown  OBJECTIVE:  Note: Objective measures were completed at Evaluation unless otherwise noted.  DIAGNOSTIC FINDINGS:  L knee xray  12/03/22 IMPRESSION: Negative.  COGNITION: Overall cognitive status: Within functional limits for tasks assessed     SENSATION: WFL  EDEMA:  Patient denies swelling  MUSCLE LENGTH: Hamstrings: Right 55 deg; Left 55 deg  POSTURE: rounded shoulders, forward head, decreased lumbar lordosis, and narrow BOS with mildly externally rotated feet, B knees in ext.  PALPATION: L knee TTP medial joint line and posterior. L foot has an area in medial plantar surface of foot, just ant to calcaneus, which is calloused, mildly TTP. She reports that this is residual from the removal of a cancerous growth.   LOWER EXTREMITY ROM: B hips and ankles WFL  Active ROM Right eval Left eval  Hip flexion    Hip extension    Hip abduction    Hip adduction    Hip internal rotation    Hip external rotation    Knee flexion 135 121  Knee extension    Ankle dorsiflexion    Ankle plantarflexion    Ankle inversion    Ankle eversion     (Blank rows = not tested)  LOWER EXTREMITY MMT:  MMT Right eval Left eval  Hip flexion 4 4-  Hip extension 3+ 3  Hip abduction 4- 3+  Hip adduction    Hip internal rotation    Hip external rotation    Knee flexion 4 4-  Knee extension 4 4-  Ankle dorsiflexion 5 5  Ankle plantarflexion 4- 3  Ankle inversion    Ankle eversion     (Blank rows = not tested)  LOWER EXTREMITY SPECIAL TESTS:  Knee special tests: Apley's compression test: negative  GAIT: Distance walked: In clinic distances Assistive device utilized: None Level of assistance: Modified independence Comments: Patient uses a cane only if her knee pain is present. No major gait deviations observed. However, when standing and walking barefoot, she holds L foot in supination to avoid WB through the medial/inferior border of her foot.   TODAY'S TREATMENT:                                                                                                                              DATE:   12/17/22 Education    PATIENT EDUCATION:  Education details: POC Person educated: Patient and interpreter Education method: Explanation Education comprehension: verbalized understanding  HOME EXERCISE PROGRAM: TBD  ASSESSMENT:  CLINICAL IMPRESSION: Patient is a 77 y.o. who was seen today for physical therapy evaluation and treatment for  chronic L knee pain. She reports that 5 years ago she had a cancerous growth removed from the bottom of her L foot. She presents with weakness in her hips, L LE generally weaker than R. She stands in L mild supination to avoid WB through the surgical site on L foot. It also appears to cause her to walk in L foot supination. Knee X ray was neg, but she does have TTP along medial joint line and post knee. Discussed the possibility of obtaining an insert or special shoes to allow her to walk in a normal gait pattern, but she is resistant to return to the foot Dr. She currently lives on the second floor and has great difficulty on the steps when she has to carry something. She is planning to move to a graound floor apartment.She will benefit from PT for BLE strengthening, especially hips and LLE in order to improve the stability of her joints in stance and decrease abnormal forces on the knee in WB.  OBJECTIVE IMPAIRMENTS: Abnormal gait, decreased activity tolerance, decreased balance, decreased coordination, decreased endurance, difficulty walking, decreased ROM, decreased strength, improper body mechanics, postural dysfunction, and pain.   ACTIVITY LIMITATIONS: bending, sitting, standing, squatting, sleeping, stairs, transfers, and locomotion level  PARTICIPATION LIMITATIONS: meal prep, cleaning, laundry, shopping, and community activity  PERSONAL FACTORS: Past/current experiences are also affecting patient's functional outcome.   REHAB POTENTIAL: Good  CLINICAL DECISION MAKING: Stable/uncomplicated  EVALUATION COMPLEXITY: Low   GOALS: Goals reviewed  with patient? Yes  SHORT TERM GOALS: Target date: 01/02/23 I with initial HEP Baseline: Goal status: INITIAL  LONG TERM GOALS: Target date: 02/25/23  I with final HEP Baseline:  Goal status: INITIAL  2.  Increase BLE strength to at least 4+/5 Baseline:  Goal status: INITIAL  3.  Patient will report at least 50% improvement in her overall pain Baseline:  Goal status: INITIAL  4.  Patient will be able to return to her normal daily activities with pain < 3/10 Baseline:  Goal status: INITIAL  5.  Ambulate at least 400' with LRAD, L knee pain < 3/10 Baseline:  Goal status: INITIAL  PLAN:  PT FREQUENCY: 2x/week  PT DURATION: 10 weeks  PLANNED INTERVENTIONS: 97110-Therapeutic exercises, 97530- Therapeutic activity, [id number]- Neuromuscular re-education, 97535- Self Care, 09811- Manual therapy, [id number]- Gait training, [phone]- Vasopneumatic device, [phone]- Ionotophoresis 4mg /ml Dexamethasone, Patient/Family education, Balance training, Stair training, Dry Needling, Cryotherapy, and Moist heat  PLAN FOR NEXT SESSION: HEP   Iona Beard, DPT 12/17/2022, 2:42 PM

## 2022-12-16 NOTE — Therapy (Deleted)
OUTPATIENT PHYSICAL THERAPY LOWER EXTREMITY EVALUATION   Patient Name: Dana Daniels MRN: [id number] DOB:1945/07/17, 77 y.o., female Today's Date: 12/16/2022  END OF SESSION:   Past Medical History:  Diagnosis Date   Allergy    Arthritis    Asthma    Blood transfusion without reported diagnosis    Cataract    Hyperlipidemia    Hypertension    Migraine    Osteoporosis    Sleep apnea    Past Surgical History:  Procedure Laterality Date   ABDOMINAL HYSTERECTOMY     CHOLECYSTECTOMY     EYE SURGERY Left    cataract    FOOT SURGERY Left    Patient Active Problem List   Diagnosis Date Noted   Chronic insomnia 09/30/2022   Allergic dermatitis 08/21/2022   Bilateral foot pain 08/05/2021   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   Seasonal allergies 03/31/2018   History of gastroesophageal reflux (GERD) 09/25/2017   Osteoarthritis of multiple joints 03/24/2017   Chronic pain of left knee 03/24/2017   Chronic obstructive pulmonary disease (HCC) 03/24/2017   Primary cancer of skin of left foot 02/03/2017   Mild intermittent asthma without complication 02/03/2017   Obstructive sleep apnea 02/03/2017   Dyslipidemia 02/03/2017   Migraine 02/03/2017   Essential hypertension 09/18/2008    PCP: Georgina Quint, MD   REFERRING PROVIDER: Rodolph Bong, MD  REFERRING DIAG: [phone] (ICD-10-CM) - Chronic pain of left knee   THERAPY DIAG:  No diagnosis found.  Rationale for Evaluation and Treatment: Rehabilitation  ONSET DATE: 12/03/22  SUBJECTIVE:   SUBJECTIVE STATEMENT: ***  PERTINENT HISTORY: Per referring physician note: 77 y.o. female with left knee pain due to exacerbation of DJD.  She was seen about 2 months ago and had steroid injection which worked pretty well.  She still having some pain.  We talked about how to maximize conservative management and have decided on physical therapy.  Also recommend Tylenol  and Voltaren gel.  Will work on authorization for hyaluronic acid injections and Zilretta injections.  However plan a recheck in about a month.  At that time could do conventional steroid injections or the second line injections listed above.  Visit conducted using a Spanish interpreter.  PAIN:  Are you having pain? {OPRCPAIN:27236}  PRECAUTIONS: {Therapy precautions:24002}  RED FLAGS: {PT Red Flags:29287}   WEIGHT BEARING RESTRICTIONS: {Yes ***/No:24003}  FALLS:  Has patient fallen in last 6 months? {fallsyesno:27318}  LIVING ENVIRONMENT: Lives with: {OPRC lives with:25569::"lives with their family"} Lives in: {Lives in:25570} Stairs: {opstairs:27293} Has following equipment at home: {Assistive devices:23999}  OCCUPATION: ***  PLOF: {PLOF:24004}  PATIENT GOALS: ***  NEXT MD VISIT: ***  OBJECTIVE:  Note: Objective measures were completed at Evaluation unless otherwise noted.  DIAGNOSTIC FINDINGS:  Knee Xray 12/03/22 IMPRESSION: Negative.  PATIENT SURVEYS:  {rehab surveys:24030}  COGNITION: Overall cognitive status: {cognition:24006}     SENSATION: {sensation:27233}  EDEMA:  {edema:24020}  MUSCLE LENGTH: Hamstrings: Right *** deg; Left *** deg Maisie Fus test: Right *** deg; Left *** deg  POSTURE: {posture:25561}  PALPATION: ***  LOWER EXTREMITY ROM:  {AROM/PROM:27142} ROM Right eval Left eval  Hip flexion    Hip extension    Hip abduction    Hip adduction    Hip internal rotation    Hip external rotation    Knee flexion    Knee extension    Ankle dorsiflexion    Ankle plantarflexion    Ankle inversion  Ankle eversion     (Blank rows = not tested)  LOWER EXTREMITY MMT:  MMT Right eval Left eval  Hip flexion    Hip extension    Hip abduction    Hip adduction    Hip internal rotation    Hip external rotation    Knee flexion    Knee extension    Ankle dorsiflexion    Ankle plantarflexion    Ankle inversion    Ankle eversion      (Blank rows = not tested)  LOWER EXTREMITY SPECIAL TESTS:  {LEspecialtests:26242}  FUNCTIONAL TESTS:  {Functional tests:24029}  GAIT: Distance walked: *** Assistive device utilized: {Assistive devices:23999} Level of assistance: {Levels of assistance:24026} Comments: ***   TODAY'S TREATMENT:                                                                                                                              DATE: ***    PATIENT EDUCATION:  Education details: *** Person educated: {Person educated:25204} Education method: {Education Method:25205} Education comprehension: {Education Comprehension:25206}  HOME EXERCISE PROGRAM: ***  ASSESSMENT:  CLINICAL IMPRESSION: Patient is a *** y.o. *** who was seen today for physical therapy evaluation and treatment for ***.   OBJECTIVE IMPAIRMENTS: {opptimpairments:25111}.   ACTIVITY LIMITATIONS: {activitylimitations:27494}  PARTICIPATION LIMITATIONS: {participationrestrictions:25113}  PERSONAL FACTORS: {Personal factors:25162} are also affecting patient's functional outcome.   REHAB POTENTIAL: {rehabpotential:25112}  CLINICAL DECISION MAKING: {clinical decision making:25114}  EVALUATION COMPLEXITY: {Evaluation complexity:25115}   GOALS: Goals reviewed with patient? {yes/no:20286}  SHORT TERM GOALS: Target date: *** *** Baseline: Goal status: INITIAL  2.  *** Baseline:  Goal status: INITIAL  3.  *** Baseline:  Goal status: INITIAL  4.  *** Baseline:  Goal status: INITIAL  5.  *** Baseline:  Goal status: INITIAL  6.  *** Baseline:  Goal status: INITIAL  LONG TERM GOALS: Target date: ***  *** Baseline:  Goal status: INITIAL  2.  *** Baseline:  Goal status: INITIAL  3.  *** Baseline:  Goal status: INITIAL  4.  *** Baseline:  Goal status: INITIAL  5.  *** Baseline:  Goal status: INITIAL  6.  *** Baseline:  Goal status: INITIAL   PLAN:  PT FREQUENCY: {rehab  frequency:25116}  PT DURATION: {rehab duration:25117}  PLANNED INTERVENTIONS: {rehab planned interventions:25118::"97110-Therapeutic exercises","97530- Therapeutic [phone]- Neuromuscular re-education","97535- Self JIRC","78938- Manual therapy"}  PLAN FOR NEXT SESSION: ***   Iona Beard, DPT 12/16/2022, 9:29 AM

## 2022-12-17 ENCOUNTER — Encounter: Payer: Self-pay | Admitting: Physical Therapy

## 2022-12-17 ENCOUNTER — Ambulatory Visit: Payer: 59 | Attending: Family Medicine | Admitting: Physical Therapy

## 2022-12-17 DIAGNOSIS — M6281 Muscle weakness (generalized): Secondary | ICD-10-CM

## 2022-12-17 DIAGNOSIS — R2681 Unsteadiness on feet: Secondary | ICD-10-CM | POA: Diagnosis not present

## 2022-12-17 DIAGNOSIS — M25562 Pain in left knee: Secondary | ICD-10-CM | POA: Insufficient documentation

## 2022-12-17 DIAGNOSIS — G8929 Other chronic pain: Secondary | ICD-10-CM | POA: Diagnosis not present

## 2022-12-17 DIAGNOSIS — R293 Abnormal posture: Secondary | ICD-10-CM | POA: Diagnosis not present

## 2022-12-17 DIAGNOSIS — R262 Difficulty in walking, not elsewhere classified: Secondary | ICD-10-CM

## 2022-12-24 ENCOUNTER — Encounter: Payer: Self-pay | Admitting: Physical Therapy

## 2022-12-24 ENCOUNTER — Ambulatory Visit: Payer: 59 | Admitting: Physical Therapy

## 2022-12-24 DIAGNOSIS — R262 Difficulty in walking, not elsewhere classified: Secondary | ICD-10-CM | POA: Diagnosis not present

## 2022-12-24 DIAGNOSIS — M25562 Pain in left knee: Secondary | ICD-10-CM | POA: Diagnosis not present

## 2022-12-24 DIAGNOSIS — G8929 Other chronic pain: Secondary | ICD-10-CM | POA: Diagnosis not present

## 2022-12-24 DIAGNOSIS — R293 Abnormal posture: Secondary | ICD-10-CM | POA: Diagnosis not present

## 2022-12-24 DIAGNOSIS — M6281 Muscle weakness (generalized): Secondary | ICD-10-CM

## 2022-12-24 DIAGNOSIS — R2681 Unsteadiness on feet: Secondary | ICD-10-CM

## 2022-12-24 NOTE — Therapy (Signed)
OUTPATIENT PHYSICAL THERAPY LOWER EXTREMITY TREATMENT   Patient Name: Dana Daniels MRN: [id number] DOB:[birth year], 77 y.o., female Today's Date: 12/24/2022  END OF SESSION:  PT End of Session - 12/24/22 1101     Visit Number 2    Date for PT Re-Evaluation 02/25/23    PT Start Time 1101    PT Stop Time 1145    PT Time Calculation (min) 44 min    Activity Tolerance Patient tolerated treatment well    Behavior During Therapy WFL for tasks assessed/performed             Past Medical History:  Diagnosis Date   Allergy    Arthritis    Asthma    Blood transfusion without reported diagnosis    Cataract    Hyperlipidemia    Hypertension    Migraine    Osteoporosis    Sleep apnea    Past Surgical History:  Procedure Laterality Date   ABDOMINAL HYSTERECTOMY     CHOLECYSTECTOMY     EYE SURGERY Left    cataract    FOOT SURGERY Left    Patient Active Problem List   Diagnosis Date Noted   Chronic insomnia 09/30/2022   Allergic dermatitis 08/21/2022   Bilateral foot pain 08/05/2021   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   Seasonal allergies 03/31/2018   History of gastroesophageal reflux (GERD) 09/25/2017   Osteoarthritis of multiple joints 03/24/2017   Chronic pain of left knee 03/24/2017   Chronic obstructive pulmonary disease (HCC) 03/24/2017   Primary cancer of skin of left foot 02/03/2017   Mild intermittent asthma without complication 02/03/2017   Obstructive sleep apnea 02/03/2017   Dyslipidemia 02/03/2017   Migraine 02/03/2017   Essential hypertension 09/18/2008    PCP: Georgina Quint, MD   REFERRING PROVIDER: Rodolph Bong, MD  REFERRING DIAG: [phone] (ICD-10-CM) - Chronic pain of left knee   THERAPY DIAG:  Abnormal posture  Difficulty in walking, not elsewhere classified  Chronic pain of left knee  Muscle weakness (generalized)  Unsteadiness on feet  Rationale for Evaluation and  Treatment: Rehabilitation  ONSET DATE: 12/03/22  SUBJECTIVE:   SUBJECTIVE STATEMENT:  My knees hurt, the more age the more pain.   PERTINENT HISTORY: Per referring physician note: 77 y.o. female with left knee pain due to exacerbation of DJD.  She was seen about 2 months ago and had steroid injection which worked pretty well.  She still having some pain.  We talked about how to maximize conservative management and have decided on physical therapy.  Also recommend Tylenol and Voltaren gel.  Will work on authorization for hyaluronic acid injections and Zilretta injections.  However plan a recheck in about a month.  At that time could do conventional steroid injections or the second line injections listed above.  Visit conducted using a Spanish interpreter.  PAIN:  Are you having pain? Yes: NPRS scale: 7/10 Pain location: L knee Pain description: tingling Aggravating factors: steps, having to walk while carrying weight Relieving factors: Tylenol, sometimes heat or ice helps  PRECAUTIONS: Fall  RED FLAGS: None   WEIGHT BEARING RESTRICTIONS: No  FALLS:  Has patient fallen in last 6 months? No  LIVING ENVIRONMENT: Lives with: lives alone Lives in: House/apartment Stairs: Yes: External: 14 steps; bilateral but cannot reach both Plans to move to first floor Has following equipment at home: Single point cane only uses when her knee hurts  OCCUPATION: N/A, when the pain is  worse, it impedes her.  PLOF: Independent  PATIENT GOALS: Patient would like to get better.  NEXT MD VISIT: unknown  OBJECTIVE:  Note: Objective measures were completed at Evaluation unless otherwise noted.  DIAGNOSTIC FINDINGS:  L knee xray 12/03/22 IMPRESSION: Negative.  COGNITION: Overall cognitive status: Within functional limits for tasks assessed     SENSATION: WFL  EDEMA:  Patient denies swelling  MUSCLE LENGTH: Hamstrings: Right 55 deg; Left 55 deg  POSTURE: rounded shoulders, forward  head, decreased lumbar lordosis, and narrow BOS with mildly externally rotated feet, B knees in ext.  PALPATION: L knee TTP medial joint line and posterior. L foot has an area in medial plantar surface of foot, just ant to calcaneus, which is calloused, mildly TTP. She reports that this is residual from the removal of a cancerous growth.   LOWER EXTREMITY ROM: B hips and ankles WFL  Active ROM Right eval Left eval  Hip flexion    Hip extension    Hip abduction    Hip adduction    Hip internal rotation    Hip external rotation    Knee flexion 135 121  Knee extension    Ankle dorsiflexion    Ankle plantarflexion    Ankle inversion    Ankle eversion     (Blank rows = not tested)  LOWER EXTREMITY MMT:  MMT Right eval Left eval  Hip flexion 4 4-  Hip extension 3+ 3  Hip abduction 4- 3+  Hip adduction    Hip internal rotation    Hip external rotation    Knee flexion 4 4-  Knee extension 4 4-  Ankle dorsiflexion 5 5  Ankle plantarflexion 4- 3  Ankle inversion    Ankle eversion     (Blank rows = not tested)  LOWER EXTREMITY SPECIAL TESTS:  Knee special tests: Apley's compression test: negative  GAIT: Distance walked: In clinic distances Assistive device utilized: None Level of assistance: Modified independence Comments: Patient uses a cane only if her knee pain is present. No major gait deviations observed. However, when standing and walking barefoot, she holds L foot in supination to avoid WB through the medial/inferior border of her foot.   TODAY'S TREATMENT:                                                                                                                              DATE:  12/24/22 NuStep L 3 x 6 min MT Bilat  Knee PROM  Patellar mobs LAQ 3lb 2x10 HS curls green 2x10 S2S holding yellow ball 2x10 Hip add ball squeeze 2x10   12/17/22 Education    PATIENT EDUCATION:  Education details: POC Person educated: Patient and  interpreter Education method: Explanation Education comprehension: verbalized understanding  HOME EXERCISE PROGRAM: TBD  ASSESSMENT:  CLINICAL IMPRESSION: Patient is a 77 y.o. who was seen today for physical therapy evaluation and treatment for chronic L knee pain. She reports that 5 years ago  she had a cancerous growth removed from the bottom of her L foot. She presents with weakness in her hips, L LE generally weaker than R. She stands in L mild supination to avoid WB through the surgical site on L foot. Session consisted of some light strengthening of both LE's. Visual LE shaking noted during session L > R. Small increase in pain with aerobic warm up that went away as time progressed. She will benefit from PT for BLE strengthening, especially hips and LLE in order to improve the stability of her joints in stance and decrease abnormal forces on the knee in WB.  OBJECTIVE IMPAIRMENTS: Abnormal gait, decreased activity tolerance, decreased balance, decreased coordination, decreased endurance, difficulty walking, decreased ROM, decreased strength, improper body mechanics, postural dysfunction, and pain.   ACTIVITY LIMITATIONS: bending, sitting, standing, squatting, sleeping, stairs, transfers, and locomotion level  PARTICIPATION LIMITATIONS: meal prep, cleaning, laundry, shopping, and community activity  PERSONAL FACTORS: Past/current experiences are also affecting patient's functional outcome.   REHAB POTENTIAL: Good  CLINICAL DECISION MAKING: Stable/uncomplicated  EVALUATION COMPLEXITY: Low   GOALS: Goals reviewed with patient? Yes  SHORT TERM GOALS: Target date: 01/02/23 I with initial HEP Baseline: Goal status: INITIAL  LONG TERM GOALS: Target date: 02/25/23  I with final HEP Baseline:  Goal status: INITIAL  2.  Increase BLE strength to at least 4+/5 Baseline:  Goal status: INITIAL  3.  Patient will report at least 50% improvement in her overall pain Baseline:  Goal  status: INITIAL  4.  Patient will be able to return to her normal daily activities with pain < 3/10 Baseline:  Goal status: INITIAL  5.  Ambulate at least 400' with LRAD, L knee pain < 3/10 Baseline:  Goal status: INITIAL  PLAN:  PT FREQUENCY: 2x/week  PT DURATION: 10 weeks  PLANNED INTERVENTIONS: 97110-Therapeutic exercises, 97530- Therapeutic activity, [id number]- Neuromuscular re-education, 97535- Self Care, [id number]- Manual therapy, 97116- Gait training, [phone]- Vasopneumatic device, [phone]- Ionotophoresis 4mg /ml Dexamethasone, Patient/Family education, Balance training, Stair training, Dry Needling, Cryotherapy, and Moist heat  PLAN FOR NEXT SESSION: HEP   Iona Beard, DPT 12/24/2022, 11:02 AM

## 2022-12-26 ENCOUNTER — Telehealth: Payer: Self-pay

## 2022-12-26 ENCOUNTER — Other Ambulatory Visit (HOSPITAL_COMMUNITY): Payer: Self-pay

## 2022-12-26 NOTE — Telephone Encounter (Signed)
Pharmacy Patient Advocate Encounter   Received notification from CoverMyMeds that prior authorization for Ubrelvy 100MG  tablets is required/requested.   Insurance verification completed.   The patient is insured through [facility] .   Per test claim: PA required; PA submitted to [facility] via CoverMyMeds Key/confirmation #/EOC [id number] Status is pending

## 2022-12-30 ENCOUNTER — Other Ambulatory Visit (HOSPITAL_COMMUNITY): Payer: Self-pay

## 2022-12-30 NOTE — Telephone Encounter (Signed)
Pharmacy Patient Advocate Encounter  Received notification from [facility] that Prior Authorization for Ubrelvy 100MG  tablets has been APPROVED from 12-26-2022 to 03-02-2024. Ran test claim, Copay is $0.00. Quantity approved is 15 tablets per 30 days. This test claim was processed through [facility]- copay amounts may vary at other pharmacies due to pharmacy/plan contracts, or as the patient moves through the different stages of their insurance plan.   PA #/Case ID/Reference #: [id number]

## 2022-12-31 ENCOUNTER — Ambulatory Visit: Payer: 59 | Admitting: Physical Therapy

## 2022-12-31 ENCOUNTER — Encounter: Payer: Self-pay | Admitting: Physical Therapy

## 2022-12-31 DIAGNOSIS — R293 Abnormal posture: Secondary | ICD-10-CM | POA: Diagnosis not present

## 2022-12-31 DIAGNOSIS — G8929 Other chronic pain: Secondary | ICD-10-CM | POA: Diagnosis not present

## 2022-12-31 DIAGNOSIS — R262 Difficulty in walking, not elsewhere classified: Secondary | ICD-10-CM | POA: Diagnosis not present

## 2022-12-31 DIAGNOSIS — M6281 Muscle weakness (generalized): Secondary | ICD-10-CM | POA: Diagnosis not present

## 2022-12-31 DIAGNOSIS — M25562 Pain in left knee: Secondary | ICD-10-CM | POA: Diagnosis not present

## 2022-12-31 DIAGNOSIS — R2681 Unsteadiness on feet: Secondary | ICD-10-CM | POA: Diagnosis not present

## 2022-12-31 NOTE — Therapy (Signed)
OUTPATIENT PHYSICAL THERAPY LOWER EXTREMITY TREATMENT   Patient Name: Dana Daniels MRN: [id number] DOB:1945-07-27, 77 y.o., female Today's Date: 12/31/2022  END OF SESSION:  PT End of Session - 12/31/22 1149     Visit Number 3    Date for PT Re-Evaluation 02/25/23    PT Start Time 1149    PT Stop Time 1230    PT Time Calculation (min) 41 min    Activity Tolerance Patient tolerated treatment well    Behavior During Therapy WFL for tasks assessed/performed             Past Medical History:  Diagnosis Date   Allergy    Arthritis    Asthma    Blood transfusion without reported diagnosis    Cataract    Hyperlipidemia    Hypertension    Migraine    Osteoporosis    Sleep apnea    Past Surgical History:  Procedure Laterality Date   ABDOMINAL HYSTERECTOMY     CHOLECYSTECTOMY     EYE SURGERY Left    cataract    FOOT SURGERY Left    Patient Active Problem List   Diagnosis Date Noted   Chronic insomnia 09/30/2022   Allergic dermatitis 08/21/2022   Bilateral foot pain 08/05/2021   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   Seasonal allergies 03/31/2018   History of gastroesophageal reflux (GERD) 09/25/2017   Osteoarthritis of multiple joints 03/24/2017   Chronic pain of left knee 03/24/2017   Chronic obstructive pulmonary disease (HCC) 03/24/2017   Primary cancer of skin of left foot 02/03/2017   Mild intermittent asthma without complication 02/03/2017   Obstructive sleep apnea 02/03/2017   Dyslipidemia 02/03/2017   Migraine 02/03/2017   Essential hypertension 09/18/2008    PCP: Georgina Quint, MD   REFERRING PROVIDER: Rodolph Bong, MD  REFERRING DIAG: [id number] (ICD-10-CM) - Chronic pain of left knee   THERAPY DIAG:  Abnormal posture  Difficulty in walking, not elsewhere classified  Chronic pain of left knee  Muscle weakness (generalized)  Rationale for Evaluation and Treatment:  Rehabilitation  ONSET DATE: 12/03/22  SUBJECTIVE:   SUBJECTIVE STATEMENT: Pain in the medial L knee  PERTINENT HISTORY: Per referring physician note: 77 y.o. female with left knee pain due to exacerbation of DJD.  She was seen about 2 months ago and had steroid injection which worked pretty well.  She still having some pain.  We talked about how to maximize conservative management and have decided on physical therapy.  Also recommend Tylenol and Voltaren gel.  Will work on authorization for hyaluronic acid injections and Zilretta injections.  However plan a recheck in about a month.  At that time could do conventional steroid injections or the second line injections listed above.  Visit conducted using a Spanish interpreter.  PAIN:  Are you having pain? Yes: NPRS scale: 6/10 Pain location: L knee Pain description: tingling Aggravating factors: steps, having to walk while carrying weight Relieving factors: Tylenol, sometimes heat or ice helps  PRECAUTIONS: Fall  RED FLAGS: None   WEIGHT BEARING RESTRICTIONS: No  FALLS:  Has patient fallen in last 6 months? No  LIVING ENVIRONMENT: Lives with: lives alone Lives in: House/apartment Stairs: Yes: External: 14 steps; bilateral but cannot reach both Plans to move to first floor Has following equipment at home: Single point cane only uses when her knee hurts  OCCUPATION: N/A, when the pain is worse, it impedes her.  PLOF: Independent  PATIENT  GOALS: Patient would like to get better.  NEXT MD VISIT: unknown  OBJECTIVE:  Note: Objective measures were completed at Evaluation unless otherwise noted.  DIAGNOSTIC FINDINGS:  L knee xray 12/03/22 IMPRESSION: Negative.  COGNITION: Overall cognitive status: Within functional limits for tasks assessed     SENSATION: WFL  EDEMA:  Patient denies swelling  MUSCLE LENGTH: Hamstrings: Right 55 deg; Left 55 deg  POSTURE: rounded shoulders, forward head, decreased lumbar lordosis,  and narrow BOS with mildly externally rotated feet, B knees in ext.  PALPATION: L knee TTP medial joint line and posterior. L foot has an area in medial plantar surface of foot, just ant to calcaneus, which is calloused, mildly TTP. She reports that this is residual from the removal of a cancerous growth.   LOWER EXTREMITY ROM: B hips and ankles WFL  Active ROM Right eval Left eval  Hip flexion    Hip extension    Hip abduction    Hip adduction    Hip internal rotation    Hip external rotation    Knee flexion 135 121  Knee extension    Ankle dorsiflexion    Ankle plantarflexion    Ankle inversion    Ankle eversion     (Blank rows = not tested)  LOWER EXTREMITY MMT:  MMT Right eval Left eval  Hip flexion 4 4-  Hip extension 3+ 3  Hip abduction 4- 3+  Hip adduction    Hip internal rotation    Hip external rotation    Knee flexion 4 4-  Knee extension 4 4-  Ankle dorsiflexion 5 5  Ankle plantarflexion 4- 3  Ankle inversion    Ankle eversion     (Blank rows = not tested)  LOWER EXTREMITY SPECIAL TESTS:  Knee special tests: Apley's compression test: negative  GAIT: Distance walked: In clinic distances Assistive device utilized: None Level of assistance: Modified independence Comments: Patient uses a cane only if her knee pain is present. No major gait deviations observed. However, when standing and walking barefoot, she holds L foot in supination to avoid WB through the medial/inferior border of her foot.   TODAY'S TREATMENT:                                                                                                                              DATE:  12/31/22 NuStep L4 x 6 min L knee PROM and STM to medial L knee   Patellar mobs HS curls red 2x10 LAQ 3lb 2x10 4in step ups x10 each 4in lateral step ups  Seated HS stretch  12/24/22 NuStep L 3 x 6 min MT Bilat  Knee PROM  Patellar mobs LAQ 3lb 2x10 HS curls green 2x10 S2S holding yellow ball  2x10 Hip add ball squeeze 2x10   12/17/22 Education    PATIENT EDUCATION:  Education details: POC Person educated: Patient and interpreter Education method: Explanation Education comprehension: verbalized understanding  HOME EXERCISE PROGRAM: TBD  ASSESSMENT:  CLINICAL IMPRESSION: Patient is a 77 y.o. who was seen today for physical therapy evaluation and treatment for chronic L knee pain. She reports that 5 years ago she had a cancerous growth removed from the bottom of her L foot. She presents with weakness in her hips, L LE generally weaker than R. She stands in L mild supination to avoid WB through the surgical site on L foot. Feedback limited due to not having an interpreter. Session consisted of some light strengthening of both LE's. Eccentric load weakness and pain present with LLE. Small increase in pain with aerobic warm up that went away as time progressed. Pt has point tenderness at he medial L knee with palpation. Bilateral HS tightness present. She will benefit from PT for BLE strengthening, especially hips and LLE in order to improve the stability of her joints in stance and decrease abnormal forces on the knee in WB.  OBJECTIVE IMPAIRMENTS: Abnormal gait, decreased activity tolerance, decreased balance, decreased coordination, decreased endurance, difficulty walking, decreased ROM, decreased strength, improper body mechanics, postural dysfunction, and pain.   ACTIVITY LIMITATIONS: bending, sitting, standing, squatting, sleeping, stairs, transfers, and locomotion level  PARTICIPATION LIMITATIONS: meal prep, cleaning, laundry, shopping, and community activity  PERSONAL FACTORS: Past/current experiences are also affecting patient's functional outcome.   REHAB POTENTIAL: Good  CLINICAL DECISION MAKING: Stable/uncomplicated  EVALUATION COMPLEXITY: Low   GOALS: Goals reviewed with patient? Yes  SHORT TERM GOALS: Target date: 01/02/23 I with initial  HEP Baseline: Goal status: INITIAL  LONG TERM GOALS: Target date: 02/25/23  I with final HEP Baseline:  Goal status: INITIAL  2.  Increase BLE strength to at least 4+/5 Baseline:  Goal status: INITIAL  3.  Patient will report at least 50% improvement in her overall pain Baseline:  Goal status: INITIAL  4.  Patient will be able to return to her normal daily activities with pain < 3/10 Baseline:  Goal status: Progressing, reports no limitaiton 12/31/27  5.  Ambulate at least 400' with LRAD, L knee pain < 3/10 Baseline:  Goal status: INITIAL  PLAN:  PT FREQUENCY: 2x/week  PT DURATION: 10 weeks  PLANNED INTERVENTIONS: 97110-Therapeutic exercises, 97530- Therapeutic activity, [id number]- Neuromuscular re-education, 97535- Self Care, 16109- Manual therapy, [phone]- Gait training, [phone]- Vasopneumatic device, [phone]- Ionotophoresis 4mg /ml Dexamethasone, Patient/Family education, Balance training, Stair training, Dry Needling, Cryotherapy, and Moist heat  PLAN FOR NEXT SESSION: HEP   Iona Beard, DPT 12/31/2022, 11:50 AM

## 2023-01-06 ENCOUNTER — Other Ambulatory Visit: Payer: Self-pay

## 2023-01-06 ENCOUNTER — Ambulatory Visit (INDEPENDENT_AMBULATORY_CARE_PROVIDER_SITE_OTHER): Payer: 59 | Admitting: Family Medicine

## 2023-01-06 VITALS — BP 130/76 | HR 95 | Ht 61.0 in | Wt 161.0 lb

## 2023-01-06 DIAGNOSIS — G8929 Other chronic pain: Secondary | ICD-10-CM | POA: Diagnosis not present

## 2023-01-06 DIAGNOSIS — M25562 Pain in left knee: Secondary | ICD-10-CM | POA: Diagnosis not present

## 2023-01-06 DIAGNOSIS — M1712 Unilateral primary osteoarthritis, left knee: Secondary | ICD-10-CM | POA: Diagnosis not present

## 2023-01-06 MED ORDER — TRIAMCINOLONE ACETONIDE 32 MG IX SRER
32.0000 mg | Freq: Once | INTRA_ARTICULAR | Status: AC
Start: 2023-01-06 — End: 2023-01-06
  Administered 2023-01-06: 32 mg via INTRA_ARTICULAR

## 2023-01-06 NOTE — Patient Instructions (Addendum)
Thank you for coming in today.   You received an injection today. Seek immediate medical attention if the joint becomes red, extremely painful, or is oozing fluid.   Check back as needed 

## 2023-01-06 NOTE — Progress Notes (Signed)
   Rubin Payor, PhD, LAT, ATC acting as a scribe for Clementeen Graham, MD.  Dana Daniels is a 77 y.o. female who presents to [facility] today for f/u L knee pain. Pt was last seen by Dr. Denyse Amass on 12/03/22 and was advised to use Tylenol, Voltaren gel, and was referred to PT, completing 3 visits. Last L knee steroid injection 10/01/22.  Today, pt reports L knee is still painful. She is unsure which shot to select.   Dx imaging: 08/21/22 L knee XR  Pertinent review of systems: No fevers or chills  Relevant historical information: Hypertension   Exam:  BP 130/76   Pulse 95   Ht 5\' 1"  (1.549 m)   Wt 161 lb (73 kg)   SpO2 96%   BMI 30.42 kg/m  General: Well Developed, well nourished, and in no acute distress.   MSK: Left knee mild effusion normal appearing otherwise.  Normal motion.    Lab and Radiology Results   Zilretta injection left knee Procedure: Real-time Ultrasound Guided Injection of left knee joint superior lateral patellar space Device: Philips Affiniti 50G Images permanently stored and available for review in PACS Verbal informed consent obtained.  Discussed risks and benefits of procedure. Warned about infection, hyperglycemia bleeding, damage to structures among others. Patient expresses understanding and agreement Time-out conducted.   Noted no overlying erythema, induration, or other signs of local infection.   Skin prepped in a sterile fashion.   Local anesthesia: Topical Ethyl chloride.   With sterile technique and under real time ultrasound guidance: Zilretta 32 mg injected into knee joint. Fluid seen entering the joint capsule.   Completed without difficulty   Advised to call if fevers/chills, erythema, induration, drainage, or persistent bleeding.   Images permanently stored and available for review in the ultrasound unit.  Impression: Technically successful ultrasound guided injection.  Lot number:  [id number]      Assessment and Plan: 77 y.o. female with left knee pain due to DJD exacerbation.  Plan for Zilretta injection today.  Could consider gel injections as well which have been authorized if this does not work.  However if Zilretta does not work well enough she does not have enough arthritis on her x-ray to explain it very well I would recommend an MRI.  Today's visit conducted using a Spanish interpreter PDMP not reviewed this encounter. Orders Placed This Encounter  Procedures   [place] LIMITED JOINT SPACE STRUCTURES LOW LEFT(NO LINKED CHARGES)    Order Specific Question:   Reason for Exam (SYMPTOM  OR DIAGNOSIS REQUIRED)    Answer:   left knee pain    Order Specific Question:   Preferred imaging location?    Answer:   Adult nurse [facility] ordered this encounter  Medications   Triamcinolone Acetonide (ZILRETTA) intra-articular injection 32 mg     Discussed warning signs or symptoms. Please see discharge instructions. Patient expresses understanding.  The above documentation has been reviewed and is accurate and complete Clementeen Graham, M.D.

## 2023-01-07 ENCOUNTER — Ambulatory Visit: Payer: 59 | Admitting: Family Medicine

## 2023-01-07 ENCOUNTER — Ambulatory Visit (INDEPENDENT_AMBULATORY_CARE_PROVIDER_SITE_OTHER): Payer: 59 | Admitting: Neurology

## 2023-01-07 DIAGNOSIS — G4733 Obstructive sleep apnea (adult) (pediatric): Secondary | ICD-10-CM | POA: Diagnosis not present

## 2023-01-07 DIAGNOSIS — G4731 Primary central sleep apnea: Secondary | ICD-10-CM

## 2023-01-07 DIAGNOSIS — R519 Headache, unspecified: Secondary | ICD-10-CM

## 2023-01-07 DIAGNOSIS — G4734 Idiopathic sleep related nonobstructive alveolar hypoventilation: Secondary | ICD-10-CM

## 2023-01-07 DIAGNOSIS — G4700 Insomnia, unspecified: Secondary | ICD-10-CM

## 2023-01-07 DIAGNOSIS — R635 Abnormal weight gain: Secondary | ICD-10-CM

## 2023-01-08 ENCOUNTER — Ambulatory Visit: Payer: 59 | Attending: Family Medicine | Admitting: Physical Therapy

## 2023-01-08 ENCOUNTER — Encounter: Payer: Self-pay | Admitting: Physical Therapy

## 2023-01-08 DIAGNOSIS — R293 Abnormal posture: Secondary | ICD-10-CM | POA: Insufficient documentation

## 2023-01-08 DIAGNOSIS — G8929 Other chronic pain: Secondary | ICD-10-CM | POA: Insufficient documentation

## 2023-01-08 DIAGNOSIS — M25562 Pain in left knee: Secondary | ICD-10-CM | POA: Diagnosis not present

## 2023-01-08 DIAGNOSIS — M6281 Muscle weakness (generalized): Secondary | ICD-10-CM | POA: Diagnosis not present

## 2023-01-08 DIAGNOSIS — R2681 Unsteadiness on feet: Secondary | ICD-10-CM | POA: Insufficient documentation

## 2023-01-08 DIAGNOSIS — R262 Difficulty in walking, not elsewhere classified: Secondary | ICD-10-CM | POA: Diagnosis not present

## 2023-01-08 NOTE — Addendum Note (Signed)
Addended by: Huston Foley on: 01/08/2023 05:13 PM   Modules accepted: Orders

## 2023-01-08 NOTE — Procedures (Signed)
[facility]  HOME SLEEP TEST (Watch PAT) REPORT  STUDY DATE: 01/07/23  DOB: 1945-12-29  MRN: [id number]  ORDERING CLINICIAN: Huston Foley, MD, PhD   REFERRING CLINICIAN: Georgina Quint, MD   CLINICAL INFORMATION/HISTORY: 77 year old female with an underlying medical history of allergies, arthritis, asthma, hypertension, hyperlipidemia, migraine headaches, osteoporosis, and mild obesity, who was previously diagnosed with obstructive sleep apnea and placed on PAP therapy. She has not used her PAP machine consistently in the past few months. She should be eligible for a new machine.   Epworth sleepiness score: 1/24.  BMI: 30 kg/m  FINDINGS:   Sleep Summary:   Total Recording Time (hours, min): 10 hours, 13 min  Total Sleep Time (hours, min):  8 hours, 32 min  Percent REM (%):    24.9%   Respiratory Indices:   Calculated pAHI (per hour):  37/hour         REM pAHI:    34.1/hour       NREM pAHI: 38/hour  Central pAHI: 6.5/hour  Oxygen Saturation Statistics:    Oxygen Saturation (%) Mean: 92%   Minimum oxygen saturation (%):                 76%   O2 Saturation Range (%): 76 - 98%    O2 Saturation (minutes) <=88%: 12.5 min  Pulse Rate Statistics:   Pulse Mean (bpm):    62/min    Pulse Range (51 - 94/min)   IMPRESSION:   OSA (obstructive sleep apnea), severe  Central Sleep Apnea  Nocturnal Hypoxemia  RECOMMENDATION:  This home sleep test demonstrates severe obstructive sleep apnea with a total AHI of 37/hour and O2 nadir of 76% with significant time below or at 88% saturation of over 10 minutes for the study, indicating nocturnal hypoxemia. There was a mild central apnea component as well. Snoring was detected, ranging from mild to louder. Ongoing treatment with a positive airway pressure device is highly recommended. The patient will be advised to proceed with autoPAP therapy at home with new equipment, as she should be eligible. A  laboratory attended titration study can be considered in the future for optimization of treatment settings and to improve tolerance and compliance, if needed, down the road. Alternative treatment options are limited secondary to the severity of the patient's sleep disordered breathing, but may include surgical treatment with an implantable hypoglossal nerve stimulator (in carefully selected candidates, meeting criteria).  Concomitant weight loss is recommended (where clinically appropriate). Please note, that untreated obstructive sleep apnea may carry additional perioperative morbidity. Patients with significant obstructive sleep apnea should receive perioperative PAP therapy and the surgeons and particularly the anesthesiologist should be informed of the diagnosis and the severity of the sleep disordered breathing. The patient should be cautioned not to drive, work at heights, or operate dangerous or heavy equipment when tired or sleepy. Review and reiteration of good sleep hygiene measures should be pursued with any patient. Other causes of the patient's symptoms, including circadian rhythm disturbances, an underlying mood disorder, medication effect and/or an underlying medical problem cannot be ruled out based on this test. Clinical correlation is recommended.  The patient and her referring provider will be notified of the test results. The patient will be seen in follow up in sleep clinic at [facility]  I certify that I have reviewed the raw data recording prior to the issuance of this report in accordance with the standards of the American Academy of Sleep Medicine (AASM).  INTERPRETING PHYSICIAN:   Huston Foley, MD, PhD Medical Director, [facility]) Diplomat, ABPN (Neurology and Sleep)   [facility] [address] [place], [place] [address] [phone]

## 2023-01-08 NOTE — Progress Notes (Signed)
See procedure note.

## 2023-01-08 NOTE — Therapy (Signed)
OUTPATIENT PHYSICAL THERAPY LOWER EXTREMITY TREATMENT   Patient Name: Dana Daniels MRN: [id number] DOB:1945-05-18, 77 y.o., female Today's Date: 01/08/2023  END OF SESSION:  PT End of Session - 01/08/23 1057     Visit Number 4    Date for PT Re-Evaluation 02/25/23    PT Start Time 1055    PT Stop Time 1135    PT Time Calculation (min) 40 min    Activity Tolerance Patient tolerated treatment well    Behavior During Therapy WFL for tasks assessed/performed            Past Medical History:  Diagnosis Date   Allergy    Arthritis    Asthma    Blood transfusion without reported diagnosis    Cataract    Hyperlipidemia    Hypertension    Migraine    Osteoporosis    Sleep apnea    Past Surgical History:  Procedure Laterality Date   ABDOMINAL HYSTERECTOMY     CHOLECYSTECTOMY     EYE SURGERY Left    cataract    FOOT SURGERY Left    Patient Active Problem List   Diagnosis Date Noted   Chronic insomnia 09/30/2022   Allergic dermatitis 08/21/2022   Bilateral foot pain 08/05/2021   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   Seasonal allergies 03/31/2018   History of gastroesophageal reflux (GERD) 09/25/2017   Osteoarthritis of multiple joints 03/24/2017   Chronic pain of left knee 03/24/2017   Chronic obstructive pulmonary disease (HCC) 03/24/2017   Primary cancer of skin of left foot 02/03/2017   Mild intermittent asthma without complication 02/03/2017   Obstructive sleep apnea 02/03/2017   Dyslipidemia 02/03/2017   Migraine 02/03/2017   Essential hypertension 09/18/2008    PCP: Georgina Quint, MD   REFERRING PROVIDER: Rodolph Bong, MD  REFERRING DIAG: [phone] (ICD-10-CM) - Chronic pain of left knee   THERAPY DIAG:  Abnormal posture  Difficulty in walking, not elsewhere classified  Chronic pain of left knee  Muscle weakness (generalized)  Unsteadiness on feet  Rationale for Evaluation and  Treatment: Rehabilitation  ONSET DATE: 12/03/22  SUBJECTIVE:   SUBJECTIVE STATEMENT: She saw the Dr and received an injection in the knee. She is not sure whether it actually helped. She bought 3# weights and has been exercising at home.  PERTINENT HISTORY: Per referring physician note: 77 y.o. female with left knee pain due to exacerbation of DJD.  She was seen about 2 months ago and had steroid injection which worked pretty well.  She still having some pain.  We talked about how to maximize conservative management and have decided on physical therapy.  Also recommend Tylenol and Voltaren gel.  Will work on authorization for hyaluronic acid injections and Zilretta injections.  However plan a recheck in about a month.  At that time could do conventional steroid injections or the second line injections listed above.  Visit conducted using a Spanish interpreter.  PAIN:  Are you having pain? Yes: NPRS scale: 6/10 Pain location: L knee Pain description: tingling Aggravating factors: steps, having to walk while carrying weight Relieving factors: Tylenol, sometimes heat or ice helps  PRECAUTIONS: Fall  RED FLAGS: None   WEIGHT BEARING RESTRICTIONS: No  FALLS:  Has patient fallen in last 6 months? No  LIVING ENVIRONMENT: Lives with: lives alone Lives in: House/apartment Stairs: Yes: External: 14 steps; bilateral but cannot reach both Plans to move to first floor Has following equipment at  home: Single point cane only uses when her knee hurts  OCCUPATION: N/A, when the pain is worse, it impedes her.  PLOF: Independent  PATIENT GOALS: Patient would like to get better.  NEXT MD VISIT: unknown  OBJECTIVE:  Note: Objective measures were completed at Evaluation unless otherwise noted.  DIAGNOSTIC FINDINGS:  L knee xray 12/03/22 IMPRESSION: Negative.  COGNITION: Overall cognitive status: Within functional limits for tasks assessed     SENSATION: WFL  EDEMA:  Patient denies  swelling  MUSCLE LENGTH: Hamstrings: Right 55 deg; Left 55 deg  POSTURE: rounded shoulders, forward head, decreased lumbar lordosis, and narrow BOS with mildly externally rotated feet, B knees in ext.  PALPATION: L knee TTP medial joint line and posterior. L foot has an area in medial plantar surface of foot, just ant to calcaneus, which is calloused, mildly TTP. She reports that this is residual from the removal of a cancerous growth.   LOWER EXTREMITY ROM: B hips and ankles WFL  Active ROM Right eval Left eval  Hip flexion    Hip extension    Hip abduction    Hip adduction    Hip internal rotation    Hip external rotation    Knee flexion 135 121  Knee extension    Ankle dorsiflexion    Ankle plantarflexion    Ankle inversion    Ankle eversion     (Blank rows = not tested)  LOWER EXTREMITY MMT:  MMT Right eval Left eval  Hip flexion 4 4-  Hip extension 3+ 3  Hip abduction 4- 3+  Hip adduction    Hip internal rotation    Hip external rotation    Knee flexion 4 4-  Knee extension 4 4-  Ankle dorsiflexion 5 5  Ankle plantarflexion 4- 3  Ankle inversion    Ankle eversion     (Blank rows = not tested)  LOWER EXTREMITY SPECIAL TESTS:  Knee special tests: Apley's compression test: negative  GAIT: Distance walked: In clinic distances Assistive device utilized: None Level of assistance: Modified independence Comments: Patient uses a cane only if her knee pain is present. No major gait deviations observed. However, when standing and walking barefoot, she holds L foot in supination to avoid WB through the medial/inferior border of her foot.   TODAY'S TREATMENT:                                                                                                                              DATE:  01/08/23 NuStep L 5 x 6 min Supine LE strengthening- bridge, clamshells, ball squeeze. SLR with 3#, SLR with hip ER Seated long kicks with 3# Standing hip abd, ext, heel raises  x 10 each with 3# Upside down BOSU ball standing weight shifts ant/post and lat Side stepping on airex plank x 5 each way.  12/31/22 NuStep L4 x 6 min L knee PROM and STM to medial L knee   Patellar mobs  HS curls red 2x10 LAQ 3lb 2x10 4in step ups x10 each 4in lateral step ups  Seated HS stretch  12/24/22 NuStep L 3 x 6 min MT Bilat  Knee PROM  Patellar mobs LAQ 3lb 2x10 HS curls green 2x10 S2S holding yellow ball 2x10 Hip add ball squeeze 2x10   12/17/22 Education    PATIENT EDUCATION:  Education details: POC Person educated: Patient and interpreter Education method: Explanation Education comprehension: verbalized understanding  HOME EXERCISE PROGRAM: TBD  ASSESSMENT:  CLINICAL IMPRESSION: Patient arrives with less of a limp. She received another injection in the knee which has helped with the pain. She also bought some ankle weights, 3#. Progressed her treatment for strengthening to BLE, provided her with HEP.  OBJECTIVE IMPAIRMENTS: Abnormal gait, decreased activity tolerance, decreased balance, decreased coordination, decreased endurance, difficulty walking, decreased ROM, decreased strength, improper body mechanics, postural dysfunction, and pain.   ACTIVITY LIMITATIONS: bending, sitting, standing, squatting, sleeping, stairs, transfers, and locomotion level  PARTICIPATION LIMITATIONS: meal prep, cleaning, laundry, shopping, and community activity  PERSONAL FACTORS: Past/current experiences are also affecting patient's functional outcome.   REHAB POTENTIAL: Good  CLINICAL DECISION MAKING: Stable/uncomplicated  EVALUATION COMPLEXITY: Low   GOALS: Goals reviewed with patient? Yes  SHORT TERM GOALS: Target date: 01/02/23 I with initial HEP Baseline: Goal status: 01/08/23-met  LONG TERM GOALS: Target date: 02/25/23  I with final HEP Baseline:  Goal status: INITIAL  2.  Increase BLE strength to at least 4+/5 Baseline:  Goal status: INITIAL  3.   Patient will report at least 50% improvement in her overall pain Baseline:  Goal status: INITIAL  4.  Patient will be able to return to her normal daily activities with pain < 3/10 Baseline:  Goal status: Progressing, reports no limitaiton 12/31/27  5.  Ambulate at least 400' with LRAD, L knee pain < 3/10 Baseline:  Goal status: INITIAL  PLAN:  PT FREQUENCY: 2x/week  PT DURATION: 10 weeks  PLANNED INTERVENTIONS: 97110-Therapeutic exercises, 97530- Therapeutic activity, [id number]- Neuromuscular re-education, 97535- Self Care, 40981- Manual therapy, [phone]- Gait training, [phone]- Vasopneumatic device, [phone]- Ionotophoresis 4mg /ml Dexamethasone, Patient/Family education, Balance training, Stair training, Dry Needling, Cryotherapy, and Moist heat  PLAN FOR NEXT SESSION: 01/08/23- knee flex with resistance for HEP   Iona Beard, DPT 01/08/2023, 11:50 AM

## 2023-01-09 NOTE — Telephone Encounter (Signed)
Pt received Zilretta inj for LEFT knee OA on 01/06/23.  Can consider repeat inj on or after 04/01/23.

## 2023-01-09 NOTE — Telephone Encounter (Signed)
Pt received Zilretta injection for LEFT knee OA on 01/06/23. HOLD Gelsyn.

## 2023-01-12 ENCOUNTER — Other Ambulatory Visit: Payer: Self-pay | Admitting: Emergency Medicine

## 2023-01-12 DIAGNOSIS — I10 Essential (primary) hypertension: Secondary | ICD-10-CM

## 2023-01-13 ENCOUNTER — Telehealth: Payer: Self-pay | Admitting: *Deleted

## 2023-01-13 NOTE — Telephone Encounter (Signed)
-----   Message from Huston Foley sent at 01/08/2023  5:13 PM EST ----- Interpreter needed.  Patient referred by PCP, seen by me on 11/11/2022, patient had a HST on 01/07/2023.    Please call and notify the patient that the recent home sleep test showed obstructive sleep apnea in the severe range. I recommend starting treatment in the form of AutoPap therapy.  She should be eligible for new machine.  I have placed an order in her chart.  I do not believe she currently has an active DME company.    The DME representative will fit the patient with a mask of choice, educate her on how to use the machine, how to put the mask on, etc. I have placed an order in the chart. Please send the order to a local DME, talk to patient, send report to referring MD. Please also reinforce the need for compliance with treatment. We will need a FU in sleep clinic for 10 weeks post-PAP set up, please arrange that with me or one of our NPs. Thanks,   Huston Foley, MD, PhD [facility])

## 2023-01-13 NOTE — Telephone Encounter (Signed)
Called Pt with interpreter line LVM for pt to call back to discuss sleep study results

## 2023-01-14 ENCOUNTER — Encounter: Payer: Self-pay | Admitting: Physical Therapy

## 2023-01-14 ENCOUNTER — Ambulatory Visit: Payer: 59 | Admitting: Physical Therapy

## 2023-01-14 DIAGNOSIS — M25562 Pain in left knee: Secondary | ICD-10-CM | POA: Diagnosis not present

## 2023-01-14 DIAGNOSIS — R293 Abnormal posture: Secondary | ICD-10-CM

## 2023-01-14 DIAGNOSIS — M6281 Muscle weakness (generalized): Secondary | ICD-10-CM | POA: Diagnosis not present

## 2023-01-14 DIAGNOSIS — R2681 Unsteadiness on feet: Secondary | ICD-10-CM | POA: Diagnosis not present

## 2023-01-14 DIAGNOSIS — R262 Difficulty in walking, not elsewhere classified: Secondary | ICD-10-CM

## 2023-01-14 DIAGNOSIS — G8929 Other chronic pain: Secondary | ICD-10-CM | POA: Diagnosis not present

## 2023-01-14 NOTE — Therapy (Signed)
OUTPATIENT PHYSICAL THERAPY LOWER EXTREMITY TREATMENT   Patient Name: Dana Daniels MRN: [id number] DOB:[birth year], 77 y.o., female Today's Date: 01/14/2023  END OF SESSION:  PT End of Session - 01/14/23 1111     Visit Number 5    Date for PT Re-Evaluation 02/25/23    PT Start Time 1100    PT Stop Time 1140    PT Time Calculation (min) 40 min    Activity Tolerance Patient tolerated treatment well    Behavior During Therapy WFL for tasks assessed/performed             Past Medical History:  Diagnosis Date   Allergy    Arthritis    Asthma    Blood transfusion without reported diagnosis    Cataract    Hyperlipidemia    Hypertension    Migraine    Osteoporosis    Sleep apnea    Past Surgical History:  Procedure Laterality Date   ABDOMINAL HYSTERECTOMY     CHOLECYSTECTOMY     EYE SURGERY Left    cataract    FOOT SURGERY Left    Patient Active Problem List   Diagnosis Date Noted   Chronic insomnia 09/30/2022   Allergic dermatitis 08/21/2022   Bilateral foot pain 08/05/2021   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   Seasonal allergies 03/31/2018   History of gastroesophageal reflux (GERD) 09/25/2017   Osteoarthritis of multiple joints 03/24/2017   Chronic pain of left knee 03/24/2017   Chronic obstructive pulmonary disease (HCC) 03/24/2017   Primary cancer of skin of left foot 02/03/2017   Mild intermittent asthma without complication 02/03/2017   Obstructive sleep apnea 02/03/2017   Dyslipidemia 02/03/2017   Migraine 02/03/2017   Essential hypertension 09/18/2008    PCP: Georgina Quint, MD   REFERRING PROVIDER: Rodolph Bong, MD  REFERRING DIAG: [phone] (ICD-10-CM) - Chronic pain of left knee   THERAPY DIAG:  Abnormal posture  Difficulty in walking, not elsewhere classified  Chronic pain of left knee  Muscle weakness (generalized)  Unsteadiness on feet  Rationale for Evaluation and  Treatment: Rehabilitation  ONSET DATE: 12/03/22  SUBJECTIVE:   SUBJECTIVE STATEMENT: Patient reports that she is frustrated that she cannot lose weight due to her medications. She is performing HEP every day and would like to continue to expand it.  PERTINENT HISTORY: Per referring physician note: 77 y.o. female with left knee pain due to exacerbation of DJD.  She was seen about 2 months ago and had steroid injection which worked pretty well.  She still having some pain.  We talked about how to maximize conservative management and have decided on physical therapy.  Also recommend Tylenol and Voltaren gel.  Will work on authorization for hyaluronic acid injections and Zilretta injections.  However plan a recheck in about a month.  At that time could do conventional steroid injections or the second line injections listed above.  Visit conducted using a Spanish interpreter.  PAIN:  Are you having pain? Yes: NPRS scale: 6/10 Pain location: L knee Pain description: tingling Aggravating factors: steps, having to walk while carrying weight Relieving factors: Tylenol, sometimes heat or ice helps  PRECAUTIONS: Fall  RED FLAGS: None   WEIGHT BEARING RESTRICTIONS: No  FALLS:  Has patient fallen in last 6 months? No  LIVING ENVIRONMENT: Lives with: lives alone Lives in: House/apartment Stairs: Yes: External: 14 steps; bilateral but cannot reach both Plans to move to first floor Has following equipment  at home: Single point cane only uses when her knee hurts  OCCUPATION: N/A, when the pain is worse, it impedes her.  PLOF: Independent  PATIENT GOALS: Patient would like to get better.  NEXT MD VISIT: unknown  OBJECTIVE:  Note: Objective measures were completed at Evaluation unless otherwise noted.  DIAGNOSTIC FINDINGS:  L knee xray 12/03/22 IMPRESSION: Negative.  COGNITION: Overall cognitive status: Within functional limits for tasks assessed     SENSATION: WFL  EDEMA:   Patient denies swelling  MUSCLE LENGTH: Hamstrings: Right 55 deg; Left 55 deg  POSTURE: rounded shoulders, forward head, decreased lumbar lordosis, and narrow BOS with mildly externally rotated feet, B knees in ext.  PALPATION: L knee TTP medial joint line and posterior. L foot has an area in medial plantar surface of foot, just ant to calcaneus, which is calloused, mildly TTP. She reports that this is residual from the removal of a cancerous growth.   LOWER EXTREMITY ROM: B hips and ankles WFL  Active ROM Right eval Left eval  Hip flexion    Hip extension    Hip abduction    Hip adduction    Hip internal rotation    Hip external rotation    Knee flexion 135 121  Knee extension    Ankle dorsiflexion    Ankle plantarflexion    Ankle inversion    Ankle eversion     (Blank rows = not tested)  LOWER EXTREMITY MMT:  MMT Right eval Left eval  Hip flexion 4 4-  Hip extension 3+ 3  Hip abduction 4- 3+  Hip adduction    Hip internal rotation    Hip external rotation    Knee flexion 4 4-  Knee extension 4 4-  Ankle dorsiflexion 5 5  Ankle plantarflexion 4- 3  Ankle inversion    Ankle eversion     (Blank rows = not tested)  LOWER EXTREMITY SPECIAL TESTS:  Knee special tests: Apley's compression test: negative  GAIT: Distance walked: In clinic distances Assistive device utilized: None Level of assistance: Modified independence Comments: Patient uses a cane only if her knee pain is present. No major gait deviations observed. However, when standing and walking barefoot, she holds L foot in supination to avoid WB through the medial/inferior border of her foot.   TODAY'S TREATMENT:                                                                                                                              DATE:  01/14/23 NuStep L5 x 6 minutes Seated knee flex and ext against G tband resistance, x 10 Mini squats x 10 Standing shoulder ext, rows, ER against G tband, 1-2  x 10 each B side to side step against G tband resistance at knees x 10  01/08/23 NuStep L 5 x 6 min Supine LE strengthening- bridge, clamshells, ball squeeze. SLR with 3#, SLR with hip ER Seated long kicks with 3# Standing hip  abd, ext, heel raises x 10 each with 3# Upside down BOSU ball standing weight shifts ant/post and lat Side stepping on airex plank x 5 each way.  12/31/22 NuStep L4 x 6 min L knee PROM and STM to medial L knee   Patellar mobs HS curls red 2x10 LAQ 3lb 2x10 4in step ups x10 each 4in lateral step ups  Seated HS stretch  12/24/22 NuStep L 3 x 6 min MT Bilat  Knee PROM  Patellar mobs LAQ 3lb 2x10 HS curls green 2x10 S2S holding yellow ball 2x10 Hip add ball squeeze 2x10   12/17/22 Education    PATIENT EDUCATION:  Education details: POC Person educated: Patient and interpreter Education method: Explanation Education comprehension: verbalized understanding  HOME EXERCISE PROGRAM: [id number]  ASSESSMENT:  CLINICAL IMPRESSION: Patient reports HEP is appropriately challenging. Updated HEP today with additional exercises for lower body and added some postural strengthening as well. She tolerated all exercises well, no reports of pain.  OBJECTIVE IMPAIRMENTS: Abnormal gait, decreased activity tolerance, decreased balance, decreased coordination, decreased endurance, difficulty walking, decreased ROM, decreased strength, improper body mechanics, postural dysfunction, and pain.   ACTIVITY LIMITATIONS: bending, sitting, standing, squatting, sleeping, stairs, transfers, and locomotion level  PARTICIPATION LIMITATIONS: meal prep, cleaning, laundry, shopping, and community activity  PERSONAL FACTORS: Past/current experiences are also affecting patient's functional outcome.   REHAB POTENTIAL: Good  CLINICAL DECISION MAKING: Stable/uncomplicated  EVALUATION COMPLEXITY: Low   GOALS: Goals reviewed with patient? Yes  SHORT TERM GOALS: Target date:  01/02/23 I with initial HEP Baseline: Goal status: 01/08/23-met  LONG TERM GOALS: Target date: 02/25/23  I with final HEP Baseline:  Goal status: INITIAL  2.  Increase BLE strength to at least 4+/5 Baseline:  Goal status: INITIAL  3.  Patient will report at least 50% improvement in her overall pain Baseline:  Goal status: INITIAL  4.  Patient will be able to return to her normal daily activities with pain < 3/10 Baseline:  Goal status: Progressing, reports no limitaiton 12/31/27  5.  Ambulate at least 400' with LRAD, L knee pain < 3/10 Baseline:  Goal status: INITIAL  PLAN:  PT FREQUENCY: 2x/week  PT DURATION: 10 weeks  PLANNED INTERVENTIONS: 97110-Therapeutic exercises, 97530- Therapeutic activity, [id number]- Neuromuscular re-education, 97535- Self Care, 16109- Manual therapy, [id number]- Gait training, 97016- Vasopneumatic device, [phone]- Ionotophoresis 4mg /ml Dexamethasone, Patient/Family education, Balance training, Stair training, Dry Needling, Cryotherapy, and Moist heat  PLAN FOR NEXT SESSION: 01/08/23- knee flex with resistance for HEP, 01/14/23-Educate her to using stationary bike at home.   Iona Beard, DPT 01/14/2023, 11:41 AM

## 2023-01-15 ENCOUNTER — Ambulatory Visit: Payer: 59 | Admitting: Emergency Medicine

## 2023-01-19 ENCOUNTER — Ambulatory Visit (INDEPENDENT_AMBULATORY_CARE_PROVIDER_SITE_OTHER): Payer: 59 | Admitting: Emergency Medicine

## 2023-01-19 ENCOUNTER — Encounter: Payer: Self-pay | Admitting: Emergency Medicine

## 2023-01-19 VITALS — BP 128/88 | HR 79 | Temp 98.2°F | Ht 61.0 in | Wt 166.6 lb

## 2023-01-19 DIAGNOSIS — E785 Hyperlipidemia, unspecified: Secondary | ICD-10-CM

## 2023-01-19 DIAGNOSIS — Z8669 Personal history of other diseases of the nervous system and sense organs: Secondary | ICD-10-CM

## 2023-01-19 DIAGNOSIS — Z8719 Personal history of other diseases of the digestive system: Secondary | ICD-10-CM | POA: Diagnosis not present

## 2023-01-19 DIAGNOSIS — M25562 Pain in left knee: Secondary | ICD-10-CM | POA: Diagnosis not present

## 2023-01-19 DIAGNOSIS — I10 Essential (primary) hypertension: Secondary | ICD-10-CM | POA: Diagnosis not present

## 2023-01-19 DIAGNOSIS — G4733 Obstructive sleep apnea (adult) (pediatric): Secondary | ICD-10-CM

## 2023-01-19 DIAGNOSIS — J302 Other seasonal allergic rhinitis: Secondary | ICD-10-CM

## 2023-01-19 DIAGNOSIS — J449 Chronic obstructive pulmonary disease, unspecified: Secondary | ICD-10-CM | POA: Diagnosis not present

## 2023-01-19 DIAGNOSIS — G8929 Other chronic pain: Secondary | ICD-10-CM | POA: Diagnosis not present

## 2023-01-19 DIAGNOSIS — G43709 Chronic migraine without aura, not intractable, without status migrainosus: Secondary | ICD-10-CM | POA: Diagnosis not present

## 2023-01-19 DIAGNOSIS — M159 Polyosteoarthritis, unspecified: Secondary | ICD-10-CM | POA: Diagnosis not present

## 2023-01-19 LAB — CBC WITH DIFFERENTIAL/PLATELET
Basophils Absolute: 0 10*3/uL (ref 0.0–0.1)
Basophils Relative: 0.4 % (ref 0.0–3.0)
Eosinophils Absolute: 0.1 10*3/uL (ref 0.0–0.7)
Eosinophils Relative: 0.7 % (ref 0.0–5.0)
HCT: 44.8 % (ref 36.0–46.0)
Hemoglobin: 14.2 g/dL (ref 12.0–15.0)
Lymphocytes Relative: 36.7 % (ref 12.0–46.0)
Lymphs Abs: 3.9 10*3/uL (ref 0.7–4.0)
MCHC: 31.8 g/dL (ref 30.0–36.0)
MCV: 87.8 fL (ref 78.0–100.0)
Monocytes Absolute: 0.7 10*3/uL (ref 0.1–1.0)
Monocytes Relative: 7 % (ref 3.0–12.0)
Neutro Abs: 5.9 10*3/uL (ref 1.4–7.7)
Neutrophils Relative %: 55.2 % (ref 43.0–77.0)
Platelets: 314 10*3/uL (ref 150.0–400.0)
RBC: 5.1 Mil/uL (ref 3.87–5.11)
RDW: 14.2 % (ref 11.5–15.5)
WBC: 10.7 10*3/uL — ABNORMAL HIGH (ref 4.0–10.5)

## 2023-01-19 LAB — COMPREHENSIVE METABOLIC PANEL
ALT: 13 U/L (ref 0–35)
AST: 14 U/L (ref 0–37)
Albumin: 4.3 g/dL (ref 3.5–5.2)
Alkaline Phosphatase: 99 U/L (ref 39–117)
BUN: 12 mg/dL (ref 6–23)
CO2: 28 meq/L (ref 19–32)
Calcium: 9.2 mg/dL (ref 8.4–10.5)
Chloride: 103 meq/L (ref 96–112)
Creatinine, Ser: 0.7 mg/dL (ref 0.40–1.20)
GFR: 84.64 mL/min (ref >60.00)
Glucose, Bld: 88 mg/dL (ref 70–99)
Potassium: 4 meq/L (ref 3.5–5.1)
Sodium: 141 meq/L (ref 135–145)
Total Bilirubin: 0.3 mg/dL (ref 0.2–1.2)
Total Protein: 7.1 g/dL (ref 6.0–8.3)

## 2023-01-19 LAB — LIPID PANEL
Cholesterol: 204 mg/dL — ABNORMAL HIGH (ref 0–200)
HDL: 58.3 mg/dL (ref >39.00)
LDL Cholesterol: 131 mg/dL — ABNORMAL HIGH (ref 0–99)
NonHDL: 145.53
Total CHOL/HDL Ratio: 3
Triglycerides: 73 mg/dL (ref 0.0–149.0)
VLDL: 14.6 mg/dL (ref 0.0–40.0)

## 2023-01-19 LAB — HEMOGLOBIN A1C: Hgb A1c MFr Bld: 5.9 % (ref 4.6–6.5)

## 2023-01-19 MED ORDER — SIMVASTATIN 20 MG PO TABS: 20.0000 mg | ORAL_TABLET | Freq: Every day | ORAL | 3 refills | Status: DC

## 2023-01-19 MED ORDER — TRELEGY ELLIPTA 100-62.5-25 MCG/ACT IN AEPB: INHALATION_SPRAY | RESPIRATORY_TRACT | 3 refills | Status: DC

## 2023-01-19 MED ORDER — UBRELVY 100 MG PO TABS: 100.0000 mg | ORAL_TABLET | Freq: Every day | ORAL | 3 refills | Status: DC | PRN

## 2023-01-19 MED ORDER — MONTELUKAST SODIUM 10 MG PO TABS: 10.0000 mg | ORAL_TABLET | Freq: Every day | ORAL | 3 refills | Status: DC

## 2023-01-19 MED ORDER — ALBUTEROL SULFATE HFA 108 (90 BASE) MCG/ACT IN AERS: 1.0000 | INHALATION_SPRAY | RESPIRATORY_TRACT | 3 refills | Status: DC | PRN

## 2023-01-19 NOTE — Assessment & Plan Note (Signed)
BP Readings from Last 3 Encounters:  01/19/23 128/88  01/06/23 130/76  12/03/22 130/76  Well-controlled hypertension Continues cartia XT 240 mg daily Cardiovascular risks associated with hypertension discussed Dietary approaches to stop hypertension discussed

## 2023-01-19 NOTE — Assessment & Plan Note (Signed)
Clinically stable.  Dana Daniels helping to abort headaches. Refill sent today

## 2023-01-19 NOTE — Assessment & Plan Note (Signed)
Chronic stable condition Continue simvastatin 20 mg daily Diet and nutrition discussed

## 2023-01-19 NOTE — Progress Notes (Signed)
Dana Daniels 77 y.o.   Chief Complaint  Patient presents with   Medication Refill    Refill for Albuterol, Trelegy, montelukast, simvastatin, and Ubrelvy. Patient normally take b/p meds at night     HISTORY OF PRESENT ILLNESS: This is a 77 y.o. female here for follow-up of chronic medical conditions. Overall doing well.  Has no complaints or medical concerns today Getting treatment for chronic knee pain with local injections and physical therapy.  Helping and improving.  HPI   Prior to Admission medications   Medication Sig Start Date End Date Taking? Authorizing Provider  albuterol (VENTOLIN HFA) 108 (90 Base) MCG/ACT inhaler INHALE 2 PUFFS BY MOUTH EVERY 6 HOURS AS NEEDED FOR WHEEZING FOR SHORTNESS OF BREATH 09/22/22  Yes Georgina Quint, MD  azithromycin [facility]) 250 MG tablet Sig as indicated 05/15/22  Yes Sylvania Moss, [place], MD  CARTIA XT 240 MG 24 hr capsule Take 1 capsule by mouth once daily 01/12/23  Yes Marky Buresh, Eilleen Kempf, MD  cromolyn (NASALCROM) 5.2 MG/ACT nasal spray Place 1 spray into both nostrils 2 (two) times daily. 01/13/19  Yes Nicolemarie Wooley, Eilleen Kempf, MD  Fluticasone-Umeclidin-Vilant [facility]) 100-62.5-25 MCG/ACT AEPB Inhale 1 puff by mouth once daily 11/05/22  Yes Rasheem Figiel, Eilleen Kempf, MD  montelukast (SINGULAIR) 10 MG tablet Take 1 tablet (10 mg total) by mouth at bedtime. 08/21/22 02/17/23 Yes Sherilynn Dieu, Eilleen Kempf, MD  nitroGLYCERIN (NITROSTAT) 0.4 MG SL tablet DISSOLVE ONE TABLET UNDER THE TONGUE EVERY 5 MINUTES AS NEEDED FOR CHEST PAIN.  DO NOT EXCEED A TOTAL OF 3 DOSES IN 15 MINUTES 02/08/21  Yes Hashim Eichhorst, Eilleen Kempf, MD  omeprazole (PRILOSEC) 40 MG capsule Take 1 capsule (40 mg total) by mouth daily. 08/21/22 08/16/23 Yes Skilynn Durney, Eilleen Kempf, MD  simvastatin (ZOCOR) 20 MG tablet Take 1 tablet (20 mg total) by mouth daily. 08/21/22 08/16/23 Yes Laneka Mcgrory, Eilleen Kempf, MD  Ubrogepant (UBRELVY) 100 MG TABS Take 1 tablet (100 mg total) by  mouth daily as needed. Take at onset of migraine headache 12/12/22  Yes Hildred Pharo, Eilleen Kempf, MD  zolpidem (AMBIEN) 5 MG tablet Take 1 tablet (5 mg total) by mouth at bedtime as needed for sleep. 09/30/22  Yes Eligh Rybacki, Eilleen Kempf, MD  cetirizine (ZYRTEC) 10 MG tablet Take 1 tablet (10 mg total) by mouth daily. 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   Seasonal allergies 03/31/2018   History of gastroesophageal reflux (GERD) 09/25/2017   Osteoarthritis of multiple joints 03/24/2017   Chronic pain of left knee 03/24/2017   Chronic obstructive pulmonary disease (HCC) 03/24/2017   Primary cancer of skin of left foot 02/03/2017   Mild intermittent asthma without complication 02/03/2017   Obstructive sleep apnea 02/03/2017   Dyslipidemia 02/03/2017   Migraine 02/03/2017   Essential hypertension 09/18/2008    Past Medical History:  Diagnosis Date   Allergy    Arthritis    Asthma    Blood transfusion without reported diagnosis    Cataract    Hyperlipidemia    Hypertension    Migraine    Osteoporosis    Sleep apnea     Past Surgical History:  Procedure Laterality Date   ABDOMINAL HYSTERECTOMY  CHOLECYSTECTOMY     EYE SURGERY Left    cataract    FOOT SURGERY Left     Social History   Socioeconomic History   Marital status: Widowed    Spouse name: Not on file   Number of children: Not on file   Years of education: Not on file   Highest education level: Associate degree: occupational, Scientist, product/process development, or vocational program  Occupational History   Not on file  Tobacco Use   Smoking status: Never   Smokeless tobacco: Never  Vaping Use   Vaping status: Never Used  Substance and Sexual  Activity   Alcohol use: No   Drug use: Never   Sexual activity: Not on file  Other Topics Concern   Not on file  Social History Narrative   Not on file   Social Determinants of Health   Financial Resource Strain: Low Risk  (01/18/2023)   Overall Financial Resource Strain (CARDIA)    Difficulty of Paying Living Expenses: Not hard at all  Food Insecurity: No Food Insecurity (11/05/2022)   Hunger Vital Sign    Worried About Running Out of Food in the Last Year: Never true    Ran Out of Food in the Last Year: Never true  Transportation Needs: No Transportation Needs (11/05/2022)   PRAPARE - Administrator, Civil Service (Medical): No    Lack of Transportation (Non-Medical): No  Physical Activity: Insufficiently Active (01/18/2023)   Exercise Vital Sign    Days of Exercise per Week: 3 days    Minutes of Exercise per Session: 30 min  Stress: No Stress Concern Present (01/18/2023)   [employer] of Occupational Health - Occupational Stress Questionnaire    Feeling of Stress : Not at all  Social Connections: Unknown (01/18/2023)   Social Connection and Isolation Panel [NHANES]    Frequency of Communication with Friends and Family: More than three times a week    Frequency of Social Gatherings with Friends and Family: Once a week    Attends Religious Services: Patient declined    Database administrator or Organizations: No    Attends Banker Meetings: Patient declined    Marital Status: Widowed  Intimate Partner Violence: Not At Risk (11/05/2022)   Humiliation, Afraid, Rape, and Kick questionnaire    Fear of Current or Ex-Partner: No    Emotionally Abused: No    Physically Abused: No    Sexually Abused: No    Family History  Problem Relation Age of Onset   Heart disease Mother    Hyperlipidemia Mother    Hypertension Mother    Stroke Mother    Heart disease Father    Stroke Father    Diabetes Daughter    Hypertension Daughter    Hypertension Son     Hyperlipidemia Son    Fibromyalgia Son    Lupus Son    Sleep apnea Son    Hypertension Son    Sleep apnea Son      Review of Systems  Constitutional: Negative.  Negative for chills and fever.  HENT: Negative.  Negative for congestion and sore throat.   Respiratory: Negative.  Negative for cough and shortness of breath.   Cardiovascular: Negative.  Negative for chest pain and palpitations.  Gastrointestinal:  Negative for abdominal pain, diarrhea, nausea and vomiting.  Genitourinary: Negative.  Negative for dysuria and hematuria.  Musculoskeletal:  Positive for joint pain.  Skin: Negative.  Negative for rash.  Neurological: Negative.  Negative  for dizziness and headaches.  All other systems reviewed and are negative.   Vitals:   01/19/23 1001  BP: (!) 142/88  Pulse: 79  Temp: 98.2 F (36.8 C)  SpO2: 96%    Physical Exam Vitals reviewed.  Constitutional:      Appearance: Normal appearance.  HENT:     Head: Normocephalic.     Mouth/Throat:     Mouth: Mucous membranes are moist.     Pharynx: Oropharynx is clear.  Eyes:     Extraocular Movements: Extraocular movements intact.     Pupils: Pupils are equal, round, and reactive to light.  Cardiovascular:     Rate and Rhythm: Normal rate and regular rhythm.     Pulses: Normal pulses.     Heart sounds: Normal heart sounds.  Pulmonary:     Effort: Pulmonary effort is normal.     Breath sounds: Normal breath sounds.  Musculoskeletal:     Cervical back: No tenderness.  Lymphadenopathy:     Cervical: No cervical adenopathy.  Skin:    General: Skin is warm and dry.     Capillary Refill: Capillary refill takes less than 2 seconds.  Neurological:     General: No focal deficit present.     Mental Status: She is alert and oriented to person, place, and time.  Psychiatric:        Mood and Affect: Mood normal.        Behavior: Behavior normal.      ASSESSMENT & PLAN: A total of 45 minutes was spent with the patient  and counseling/coordination of care regarding preparing for this visit, review of most recent office visit notes, review of multiple chronic medical conditions under management, review of all medications, review of most recent blood work results, cardiovascular risks associated with hypertension and dyslipidemia, education on nutrition, review of health maintenance items, prognosis, documentation, and need for follow-up.  Problem List Items Addressed This Visit       Cardiovascular and Mediastinum   Essential hypertension - Primary    BP Readings from Last 3 Encounters:  01/19/23 128/88  01/06/23 130/76  12/03/22 130/76  Well-controlled hypertension Continues cartia XT 240 mg daily Cardiovascular risks associated with hypertension discussed Dietary approaches to stop hypertension discussed       Relevant Medications   simvastatin (ZOCOR) 20 MG tablet   Other Relevant Orders   CBC with Differential/Platelet   Comprehensive metabolic panel   Hemoglobin A1c   Lipid panel   Migraine    Clinically stable.  Bernita Raisin helping to abort headaches. Refill sent today      Relevant Medications   Ubrogepant (UBRELVY) 100 MG TABS   simvastatin (ZOCOR) 20 MG tablet     Respiratory   Obstructive sleep apnea    Recently diagnosed.  Recommended CPAP treatment.      Chronic obstructive pulmonary disease (HCC)    Clinically stable. Continues daily Trelegy with success No recent use of albuterol rescue inhaler      Relevant Medications   albuterol (VENTOLIN HFA) 108 (90 Base) MCG/ACT inhaler   montelukast (SINGULAIR) 10 MG tablet   Fluticasone-Umeclidin-Vilant (TRELEGY ELLIPTA) 100-62.5-25 MCG/ACT AEPB     Musculoskeletal and Integument   Osteoarthritis of multiple joints    Including both knees. Local injections and physical therapy helping        Other   Dyslipidemia    Chronic stable condition Continue simvastatin 20 mg daily Diet and nutrition discussed      Relevant  Medications  simvastatin (ZOCOR) 20 MG tablet   Other Relevant Orders   CBC with Differential/Platelet   Comprehensive metabolic panel   Hemoglobin A1c   Lipid panel   Chronic pain of left knee    Injections and physical therapy helping quite a bit.      History of gastroesophageal reflux (GERD)    Clinically stable and well-controlled Continues omeprazole 40 mg as needed      Seasonal allergies   Relevant Medications   montelukast (SINGULAIR) 10 MG tablet   Other Visit Diagnoses     History of migraine       Relevant Medications   Ubrogepant (UBRELVY) 100 MG TABS      Patient Instructions  Mantenimiento de la salud despus de los 65 aos de edad Health Maintenance After Age 75 Despus de los 65 aos de edad, corre un riesgo mayor de Film/video editor enfermedades e infecciones a Air cabin crew, como tambin de sufrir lesiones por cadas. Las cadas son la causa principal de las fracturas de huesos y lesiones en la cabeza de personas mayores de 65 aos de edad. Recibir cuidados preventivos de forma regular puede ayudarlo a mantenerse saludable y en buen [place]. Los cuidados preventivos incluyen realizarse anlisis de forma regular y Forensic psychologist en el estilo de vida segn las recomendaciones del mdico. Converse con el mdico sobre lo siguiente: Las pruebas de deteccin y los anlisis que debe International aid/development worker. Una prueba de deteccin es un estudio que se para Engineer, manufacturing la presencia de una enfermedad cuando no tiene sntomas. Un plan de dieta y ejercicios adecuado para usted. Qu debo saber sobre las pruebas de deteccin y los anlisis para prevenir cadas? Realizarse pruebas de deteccin y [employer] es la mejor manera de Engineer, manufacturing un problema de salud de forma temprana. El diagnstico y tratamiento tempranos le brindan la mejor oportunidad de Chief Operating Officer las afecciones mdicas que son comunes despus de los 65 aos de edad. Ciertas afecciones y elecciones de estilo de vida pueden hacer que  sea ms propenso a sufrir Engineer, site. El mdico puede recomendarle lo siguiente: Controles regulares de la visin. Una visin deficiente y afecciones como las cataratas pueden hacer que sea ms propenso a sufrir Engineer, site. Si [place] lentes, asegrese de obtener una receta actualizada si su visin cambia. Revisin de medicamentos. Revise regularmente con el mdico todos los medicamentos que toma, incluidos los medicamentos de [place]. Consulte al [employer] efectos secundarios que pueden hacer que sea ms propenso a sufrir Engineer, site. Informe al mdico si alguno de los medicamentos que toma lo hace sentir mareado o somnoliento. Controles de fuerza y equilibrio. El mdico puede recomendar ciertos estudios para controlar su fuerza y equilibrio al estar de pie, al caminar o al cambiar de posicin. Examen de los pies. El dolor y Materials engineer en los pies, como tambin no utilizar el calzado [place], pueden hacer que sea ms propenso a sufrir Engineer, site. Pruebas de deteccin, que incluyen las siguientes: Pruebas de deteccin para la osteoporosis. La osteoporosis es una afeccin que hace que los huesos se tornen ms dbiles y se quiebren con ms facilidad. Pruebas de deteccin para la presin arterial. Los cambios en la presin arterial y los medicamentos para Chief Operating Officer la presin arterial pueden hacerlo sentir mareado. Prueba de deteccin de la depresin. Es ms probable que sufra una cada si tiene miedo a caerse, se siente deprimido o se siente incapaz de Probation officer. Prueba de deteccin de consumo de alcohol. Beber demasiado alcohol  puede afectar su equilibrio y puede hacer que sea ms propenso a sufrir Engineer, site. Siga estas indicaciones en su casa: Estilo de vida No beba alcohol si: Su mdico le indica no hacerlo. Si bebe alcohol: Limite la cantidad que bebe a lo siguiente: De 0 a 1 medida por da para las mujeres. De 0 a 2 medidas por da para los hombres. Sepa  cunta cantidad de alcohol hay en las bebidas que toma. En los [address], una medida equivale a una botella de cerveza de 12 oz (355 ml), un vaso de vino de 5 oz (148 ml) o un vaso de una bebida alcohlica de alta graduacin de 1 oz (44 ml). No consuma ningn producto que contenga nicotina o tabaco. Estos productos incluyen cigarrillos, tabaco para Theatre manager y aparatos de vapeo, como los Administrator, Civil Service. Si necesita ayuda para dejar de consumir estos productos, consulte al [employer]. Actividad  Siga un programa de ejercicio regular para mantenerse en forma. Esto lo ayudar a Radio producer equilibrio. Consulte al mdico qu tipos de ejercicios son adecuados para usted. Si necesita un bastn o un andador, selo segn las recomendaciones del mdico. Utilice calzado con buen apoyo y suela antideslizante. Seguridad  Retire los [employer] puedan causar tropiezos tales como alfombras, cables u obstculos. Instale equipos de seguridad, como barras para sostn en los baos y barandas de seguridad en las escaleras. Mantenga las habitaciones y los pasillos bien iluminados. Indicaciones generales Hable con el mdico sobre sus riesgos de sufrir una cada. Infrmele a su mdico si: Se cae. Asegrese de informarle a su mdico acerca de todas las cadas, incluso aquellas que parecen ser [employer]. Se siente mareado, cansado (tiene fatiga) o siente que pierde el equilibrio. Use los medicamentos de venta libre y los recetados solamente como se lo haya indicado el mdico. Estos incluyen suplementos. Siga una dieta sana y [place] un peso saludable. Una dieta saludable incluye productos lcteos descremados, carnes bajas en contenido de grasa ([place]), fibra de granos enteros, frijoles y [place] frutas y verduras. Mantngase al da con las vacunas. Realcese los estudios de rutina de la salud, dentales y de Wellsite geologist. Resumen Tener un estilo de vida saludable y recibir cuidados preventivos pueden ayudar a  Research scientist (physical sciences) salud y el bienestar despus de los 65 aos de [place]. Realizarse pruebas de deteccin y [employer] es la mejor manera de Engineer, manufacturing un problema de salud de forma temprana y [place]ber [place] a Automotive engineer una cada. El diagnstico y tratamiento tempranos le brindan la mejor oportunidad de Chief Operating Officer las afecciones mdicas ms comunes en las personas mayores de 65 aos de edad. Las cadas son la causa principal de las fracturas de huesos y lesiones en la cabeza de personas mayores de 65 aos de edad. Tome precauciones para evitar una cada en su casa. Trabaje con el mdico para saber qu cambios que puede hacer para mejorar su salud y [place], y para prevenir las cadas. Esta informacin no tiene Theme park manager el consejo del mdico. Asegrese de hacerle al mdico cualquier pregunta que tenga. Document Revised: 07/25/2020 Document Reviewed: 07/25/2020 Elsevier Patient Education  2024 Elsevier Inc.    Edwina Barth, MD  Primary Care at [facility]

## 2023-01-19 NOTE — Assessment & Plan Note (Signed)
Recently diagnosed.  Recommended CPAP treatment.

## 2023-01-19 NOTE — Assessment & Plan Note (Signed)
Clinically stable. Continues daily Trelegy with success No recent use of albuterol rescue inhaler

## 2023-01-19 NOTE — Assessment & Plan Note (Signed)
Clinically stable and well-controlled Continues omeprazole 40 mg as needed

## 2023-01-19 NOTE — Telephone Encounter (Signed)
Pt's son Dana Daniels (on [place]) called our office back.  We discussed the patient's sleep study results.  The patient's son verbalized understanding and is agreeable to proceed with AutoPap.  He states the patient used [facility].  He will be on the look out for a call from them for new machine set up.  We discussed the insurance compliance requirements which includes using the machine at least 4 hours at night and also being seen by our office for initial follow-up appointment.  The patient was scheduled with Sarah NP for 04/14/2023 at 12:45 PM arrival 12:15 pm. His questions were answered during the call.   Order sent to [facility].

## 2023-01-19 NOTE — Telephone Encounter (Signed)
New, Maryella Shivers, Otilio Jefferson, RN; Alain Honey; Jeris Penta, Luis Llorons Torres; 1 other Received, thank you!

## 2023-01-19 NOTE — Assessment & Plan Note (Signed)
Including both knees. Local injections and physical therapy helping

## 2023-01-19 NOTE — Assessment & Plan Note (Signed)
Injections and physical therapy helping quite a bit.

## 2023-01-19 NOTE — Patient Instructions (Signed)
Mantenimiento de la salud despus de los 65 aos de edad Health Maintenance After Age 75 Despus de los 65 aos de edad, corre un riesgo mayor de padecer ciertas enfermedades e infecciones a largo plazo, como tambin de sufrir lesiones por cadas. Las cadas son la causa principal de las fracturas de huesos y lesiones en la cabeza de personas mayores de 65 aos de edad. Recibir cuidados preventivos de forma regular puede ayudarlo a mantenerse saludable y en buen estado. Los cuidados preventivos incluyen realizarse anlisis de forma regular y realizar cambios en el estilo de vida segn las recomendaciones del mdico. Converse con el mdico sobre lo siguiente: Las pruebas de deteccin y los anlisis que debe realizarse. Una prueba de deteccin es un estudio que se para detectar la presencia de una enfermedad cuando no tiene sntomas. Un plan de dieta y ejercicios adecuado para usted. Qu debo saber sobre las pruebas de deteccin y los anlisis para prevenir cadas? Realizarse pruebas de deteccin y anlisis es la mejor manera de detectar un problema de salud de forma temprana. El diagnstico y tratamiento tempranos le brindan la mejor oportunidad de controlar las afecciones mdicas que son comunes despus de los 65 aos de edad. Ciertas afecciones y elecciones de estilo de vida pueden hacer que sea ms propenso a sufrir una cada. El mdico puede recomendarle lo siguiente: Controles regulares de la visin. Una visin deficiente y afecciones como las cataratas pueden hacer que sea ms propenso a sufrir una cada. Si usa lentes, asegrese de obtener una receta actualizada si su visin cambia. Revisin de medicamentos. Revise regularmente con el mdico todos los medicamentos que toma, incluidos los medicamentos de venta libre. Consulte al mdico sobre los efectos secundarios que pueden hacer que sea ms propenso a sufrir una cada. Informe al mdico si alguno de los medicamentos que toma lo hace sentir mareado o  somnoliento. Controles de fuerza y equilibrio. El mdico puede recomendar ciertos estudios para controlar su fuerza y equilibrio al estar de pie, al caminar o al cambiar de posicin. Examen de los pies. El dolor y el adormecimiento en los pies, como tambin no utilizar el calzado adecuado, pueden hacer que sea ms propenso a sufrir una cada. Pruebas de deteccin, que incluyen las siguientes: Pruebas de deteccin para la osteoporosis. La osteoporosis es una afeccin que hace que los huesos se tornen ms dbiles y se quiebren con ms facilidad. Pruebas de deteccin para la presin arterial. Los cambios en la presin arterial y los medicamentos para controlar la presin arterial pueden hacerlo sentir mareado. Prueba de deteccin de la depresin. Es ms probable que sufra una cada si tiene miedo a caerse, se siente deprimido o se siente incapaz de realizar actividades que sola hacer. Prueba de deteccin de consumo de alcohol. Beber demasiado alcohol puede afectar su equilibrio y puede hacer que sea ms propenso a sufrir una cada. Siga estas indicaciones en su casa: Estilo de vida No beba alcohol si: Su mdico le indica no hacerlo. Si bebe alcohol: Limite la cantidad que bebe a lo siguiente: De 0 a 1 medida por da para las mujeres. De 0 a 2 medidas por da para los hombres. Sepa cunta cantidad de alcohol hay en las bebidas que toma. En [place], una medida equivale a una botella de cerveza de 12 oz (355 ml), un vaso de vino de 5 oz (148 ml) o un vaso de una bebida alcohlica de alta graduacin de 1 oz (44 ml). No consuma ningn producto que   contenga nicotina o tabaco. Estos productos incluyen cigarrillos, tabaco para mascar y aparatos de vapeo, como los cigarrillos electrnicos. Si necesita ayuda para dejar de consumir estos productos, consulte al mdico. Actividad  Siga un programa de ejercicio regular para mantenerse en forma. Esto lo ayudar a mantener el equilibrio. Consulte al  mdico qu tipos de ejercicios son adecuados para usted. Si necesita un bastn o un andador, selo segn las recomendaciones del mdico. Utilice calzado con buen apoyo y suela antideslizante. Seguridad  Retire los objetos que puedan causar tropiezos tales como alfombras, cables u obstculos. Instale equipos de seguridad, como barras para sostn en los baos y barandas de seguridad en las escaleras. Mantenga las habitaciones y los pasillos bien iluminados. Indicaciones generales Hable con el mdico sobre sus riesgos de sufrir una cada. Infrmele a su mdico si: Se cae. Asegrese de informarle a su mdico acerca de todas las cadas, incluso aquellas que parecen ser menores. Se siente mareado, cansado (tiene fatiga) o siente que pierde el equilibrio. Use los medicamentos de venta libre y los recetados solamente como se lo haya indicado el mdico. Estos incluyen suplementos. Siga una dieta sana y mantenga un peso saludable. Una dieta saludable incluye productos lcteos descremados, carnes bajas en contenido de grasa (magras), fibra de granos enteros, frijoles y muchas frutas y verduras. Mantngase al da con las vacunas. Realcese los estudios de rutina de la salud, dentales y de la vista. Resumen Tener un estilo de vida saludable y recibir cuidados preventivos pueden ayudar a promover la salud y el bienestar despus de los 65 aos de edad. Realizarse pruebas de deteccin y anlisis es la mejor manera de detectar un problema de salud de forma temprana y ayudarlo a evitar una cada. El diagnstico y tratamiento tempranos le brindan la mejor oportunidad de controlar las afecciones mdicas ms comunes en las personas mayores de 65 aos de edad. Las cadas son la causa principal de las fracturas de huesos y lesiones en la cabeza de personas mayores de 65 aos de edad. Tome precauciones para evitar una cada en su casa. Trabaje con el mdico para saber qu cambios que puede hacer para mejorar su salud y  bienestar, y para prevenir las cadas. Esta informacin no tiene como fin reemplazar el consejo del mdico. Asegrese de hacerle al mdico cualquier pregunta que tenga. Document Revised: 07/25/2020 Document Reviewed: 07/25/2020 Elsevier Patient Education  2024 [employer]  

## 2023-01-23 DIAGNOSIS — G4733 Obstructive sleep apnea (adult) (pediatric): Secondary | ICD-10-CM | POA: Diagnosis not present

## 2023-01-28 ENCOUNTER — Other Ambulatory Visit: Payer: Self-pay

## 2023-01-28 ENCOUNTER — Ambulatory Visit: Payer: 59

## 2023-01-28 DIAGNOSIS — R293 Abnormal posture: Secondary | ICD-10-CM

## 2023-01-28 DIAGNOSIS — G8929 Other chronic pain: Secondary | ICD-10-CM | POA: Diagnosis not present

## 2023-01-28 DIAGNOSIS — R2681 Unsteadiness on feet: Secondary | ICD-10-CM

## 2023-01-28 DIAGNOSIS — R262 Difficulty in walking, not elsewhere classified: Secondary | ICD-10-CM | POA: Diagnosis not present

## 2023-01-28 DIAGNOSIS — M6281 Muscle weakness (generalized): Secondary | ICD-10-CM | POA: Diagnosis not present

## 2023-01-28 DIAGNOSIS — M25562 Pain in left knee: Secondary | ICD-10-CM | POA: Diagnosis not present

## 2023-01-28 NOTE — Therapy (Signed)
OUTPATIENT PHYSICAL THERAPY LOWER EXTREMITY TREATMENT   Patient Name: Dana Daniels MRN: [id number] DOB:[birth year], 77 y.o., female Today's Date: 01/28/2023  END OF SESSION:  PT End of Session - 01/28/23 1354     Visit Number 6    Date for PT Re-Evaluation 02/25/23    PT Start Time 1345    PT Stop Time 1430    PT Time Calculation (min) 45 min    Activity Tolerance Patient tolerated treatment well    Behavior During Therapy WFL for tasks assessed/performed              Past Medical History:  Diagnosis Date   Allergy    Arthritis    Asthma    Blood transfusion without reported diagnosis    Cataract    Hyperlipidemia    Hypertension    Migraine    Osteoporosis    Sleep apnea    Past Surgical History:  Procedure Laterality Date   ABDOMINAL HYSTERECTOMY     CHOLECYSTECTOMY     EYE SURGERY Left    cataract    FOOT SURGERY Left    Patient Active Problem List   Diagnosis Date Noted   Chronic insomnia 09/30/2022   Allergic dermatitis 08/21/2022   Bilateral foot pain 08/05/2021   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   Seasonal allergies 03/31/2018   History of gastroesophageal reflux (GERD) 09/25/2017   Osteoarthritis of multiple joints 03/24/2017   Chronic pain of left knee 03/24/2017   Chronic obstructive pulmonary disease (HCC) 03/24/2017   Primary cancer of skin of left foot 02/03/2017   Mild intermittent asthma without complication 02/03/2017   Obstructive sleep apnea 02/03/2017   Dyslipidemia 02/03/2017   Migraine 02/03/2017   Essential hypertension 09/18/2008    PCP: Georgina Quint, MD   REFERRING PROVIDER: Rodolph Bong, MD  REFERRING DIAG: [phone] (ICD-10-CM) - Chronic pain of left knee   THERAPY DIAG:  Abnormal posture  Difficulty in walking, not elsewhere classified  Chronic pain of left knee  Muscle weakness (generalized)  Unsteadiness on feet  Rationale for Evaluation and  Treatment: Rehabilitation  ONSET DATE: 12/03/22  SUBJECTIVE:   SUBJECTIVE STATEMENT: Patient reports that she is improving gradually, performing the ex regularly.  Injection R knee nov 5 has helped PERTINENT HISTORY: Per referring physician note: 77 y.o. female with left knee pain due to exacerbation of DJD.  She was seen about 2 months ago and had steroid injection which worked pretty well.  She still having some pain.  We talked about how to maximize conservative management and have decided on physical therapy.  Also recommend Tylenol and Voltaren gel.  Will work on authorization for hyaluronic acid injections and Zilretta injections.  However plan a recheck in about a month.  At that time could do conventional steroid injections or the second line injections listed above.  Visit conducted using a Spanish interpreter.  PAIN:  Are you having pain? Yes: NPRS scale: 6/10 Pain location: L knee Pain description: tingling Aggravating factors: steps, having to walk while carrying weight Relieving factors: Tylenol, sometimes heat or ice helps  PRECAUTIONS: Fall  RED FLAGS: None   WEIGHT BEARING RESTRICTIONS: No  FALLS:  Has patient fallen in last 6 months? No  LIVING ENVIRONMENT: Lives with: lives alone Lives in: House/apartment Stairs: Yes: External: 14 steps; bilateral but cannot reach both Plans to move to first floor Has following equipment at home: Single point cane only uses when her knee  hurts  OCCUPATION: N/A, when the pain is worse, it impedes her.  PLOF: Independent  PATIENT GOALS: Patient would like to get better.  NEXT MD VISIT: unknown  OBJECTIVE:  Note: Objective measures were completed at Evaluation unless otherwise noted.  DIAGNOSTIC FINDINGS:  L knee xray 12/03/22 IMPRESSION: Negative.  COGNITION: Overall cognitive status: Within functional limits for tasks assessed     SENSATION: WFL  EDEMA:  Patient denies swelling  MUSCLE LENGTH: Hamstrings:  Right 55 deg; Left 55 deg  POSTURE: rounded shoulders, forward head, decreased lumbar lordosis, and narrow BOS with mildly externally rotated feet, B knees in ext.  PALPATION: L knee TTP medial joint line and posterior. L foot has an area in medial plantar surface of foot, just ant to calcaneus, which is calloused, mildly TTP. She reports that this is residual from the removal of a cancerous growth.   LOWER EXTREMITY ROM: B hips and ankles WFL  Active ROM Right eval Left eval  Hip flexion    Hip extension    Hip abduction    Hip adduction    Hip internal rotation    Hip external rotation    Knee flexion 135 121  Knee extension    Ankle dorsiflexion    Ankle plantarflexion    Ankle inversion    Ankle eversion     (Blank rows = not tested)  LOWER EXTREMITY MMT:  MMT Right eval Left eval  Hip flexion 4 4-  Hip extension 3+ 3  Hip abduction 4- 3+  Hip adduction    Hip internal rotation    Hip external rotation    Knee flexion 4 4-  Knee extension 4 4-  Ankle dorsiflexion 5 5  Ankle plantarflexion 4- 3  Ankle inversion    Ankle eversion     (Blank rows = not tested)  LOWER EXTREMITY SPECIAL TESTS:  Knee special tests: Apley's compression test: negative  GAIT: Distance walked: In clinic distances Assistive device utilized: None Level of assistance: Modified independence Comments: Patient uses a cane only if her knee pain is present. No major gait deviations observed. However, when standing and walking barefoot, she holds L foot in supination to avoid WB through the medial/inferior border of her foot.   TODAY'S TREATMENT:                                                                                                                              DATE:  01/28/23:  Nustep L5, 6 min Ue's and LE's Supine for hamstring stretch with strap, 10 sec holds, 6 reps, emphasis on stretch medial hamstring with some Lhip abduction  Standing B plantarflexor stretch, forefeet on  bar, cues to maintain hips over ankles Standing hip extension with green theraband multiple reps  Seated L plantarflexion strengthening with blue t band Seated long arc quads with 3 # 10 x3 Reviewed home program and reduced somewhat with therex that overlap  01/14/23 NuStep L5 x 6  minutes Seated knee flex and ext against G tband resistance, x 10 Mini squats x 10 Standing shoulder ext, rows, ER against G tband, 1-2 x 10 each B side to side step against G tband resistance at knees x 10  01/08/23 NuStep L 5 x 6 min Supine LE strengthening- bridge, clamshells, ball squeeze. SLR with 3#, SLR with hip ER Seated long kicks with 3# Standing hip abd, ext, heel raises x 10 each with 3# Upside down BOSU ball standing weight shifts ant/post and lat Side stepping on airex plank x 5 each way.  12/31/22 NuStep L4 x 6 min L knee PROM and STM to medial L knee   Patellar mobs HS curls red 2x10 LAQ 3lb 2x10 4in step ups x10 each 4in lateral step ups  Seated HS stretch  12/24/22 NuStep L 3 x 6 min MT Bilat  Knee PROM  Patellar mobs LAQ 3lb 2x10 HS curls green 2x10 S2S holding yellow ball 2x10 Hip add ball squeeze 2x10   12/17/22 Education    PATIENT EDUCATION:  Education details: POC Person educated: Patient and interpreter Education method: Explanation Education comprehension: verbalized understanding  HOME EXERCISE PROGRAM: [id number]  ASSESSMENT:  CLINICAL IMPRESSION: Patient reports overall improvement.  Tried to address her L hip extension and L ankle plantarflexor strength as noted to weak at initial evaluation.  HEP is appropriately challenging. She tolerated all exercises well, no reports of pain.  OBJECTIVE IMPAIRMENTS: Abnormal gait, decreased activity tolerance, decreased balance, decreased coordination, decreased endurance, difficulty walking, decreased ROM, decreased strength, improper body mechanics, postural dysfunction, and pain.   ACTIVITY LIMITATIONS: bending,  sitting, standing, squatting, sleeping, stairs, transfers, and locomotion level  PARTICIPATION LIMITATIONS: meal prep, cleaning, laundry, shopping, and community activity  PERSONAL FACTORS: Past/current experiences are also affecting patient's functional outcome.   REHAB POTENTIAL: Good  CLINICAL DECISION MAKING: Stable/uncomplicated  EVALUATION COMPLEXITY: Low   GOALS: Goals reviewed with patient? Yes  SHORT TERM GOALS: Target date: 01/02/23 I with initial HEP Baseline: Goal status: 01/08/23-met  LONG TERM GOALS: Target date: 02/25/23  I with final HEP Baseline:  Goal status: INITIAL  2.  Increase BLE strength to at least 4+/5 Baseline:  Goal status: INITIAL  3.  Patient will report at least 50% improvement in her overall pain Baseline:  Goal status: INITIAL  4.  Patient will be able to return to her normal daily activities with pain < 3/10 Baseline:  Goal status: Progressing, reports no limitaiton 12/31/27  5.  Ambulate at least 400' with LRAD, L knee pain < 3/10 Baseline:  Goal status: INITIAL  PLAN:  PT FREQUENCY: 2x/week  PT DURATION: 10 weeks  PLANNED INTERVENTIONS: 97110-Therapeutic exercises, 97530- Therapeutic activity, 97112- Neuromuscular re-education, 97535- Self Care, 16109- Manual therapy, [id number]- Gait training, 97016- Vasopneumatic device, [phone]- Ionotophoresis 4mg /ml Dexamethasone, Patient/Family education, Balance training, Stair training, Dry Needling, Cryotherapy, and Moist heat  PLAN FOR NEXT SESSION: reassess ROM and strength, continue to progress hip and ankle strength   Tawana Pasch, PT, DPT, OCS 01/28/2023, 5:23 PM

## 2023-02-02 ENCOUNTER — Ambulatory Visit: Payer: 59 | Attending: Emergency Medicine | Admitting: Physical Therapy

## 2023-02-02 ENCOUNTER — Encounter: Payer: Self-pay | Admitting: Physical Therapy

## 2023-02-02 DIAGNOSIS — G8929 Other chronic pain: Secondary | ICD-10-CM | POA: Diagnosis not present

## 2023-02-02 DIAGNOSIS — R2681 Unsteadiness on feet: Secondary | ICD-10-CM | POA: Diagnosis not present

## 2023-02-02 DIAGNOSIS — R262 Difficulty in walking, not elsewhere classified: Secondary | ICD-10-CM | POA: Insufficient documentation

## 2023-02-02 DIAGNOSIS — M25562 Pain in left knee: Secondary | ICD-10-CM | POA: Diagnosis not present

## 2023-02-02 DIAGNOSIS — R293 Abnormal posture: Secondary | ICD-10-CM | POA: Insufficient documentation

## 2023-02-02 DIAGNOSIS — M6281 Muscle weakness (generalized): Secondary | ICD-10-CM | POA: Insufficient documentation

## 2023-02-02 NOTE — Therapy (Signed)
OUTPATIENT PHYSICAL THERAPY LOWER EXTREMITY TREATMENT   Patient Name: Dana Daniels MRN: [id number] DOB:Jul 18, 1945, 77 y.o., female Today's Date: 02/02/2023  END OF SESSION:  PT End of Session - 02/02/23 1553     Visit Number 7    Date for PT Re-Evaluation 02/25/23    PT Start Time 1545    PT Stop Time 1625    PT Time Calculation (min) 40 min    Activity Tolerance Patient tolerated treatment well    Behavior During Therapy WFL for tasks assessed/performed            Past Medical History:  Diagnosis Date   Allergy    Arthritis    Asthma    Blood transfusion without reported diagnosis    Cataract    Hyperlipidemia    Hypertension    Migraine    Osteoporosis    Sleep apnea    Past Surgical History:  Procedure Laterality Date   ABDOMINAL HYSTERECTOMY     CHOLECYSTECTOMY     EYE SURGERY Left    cataract    FOOT SURGERY Left    Patient Active Problem List   Diagnosis Date Noted   Chronic insomnia 09/30/2022   Allergic dermatitis 08/21/2022   Bilateral foot pain 08/05/2021   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   Seasonal allergies 03/31/2018   History of gastroesophageal reflux (GERD) 09/25/2017   Osteoarthritis of multiple joints 03/24/2017   Chronic pain of left knee 03/24/2017   Chronic obstructive pulmonary disease (HCC) 03/24/2017   Primary cancer of skin of left foot 02/03/2017   Mild intermittent asthma without complication 02/03/2017   Obstructive sleep apnea 02/03/2017   Dyslipidemia 02/03/2017   Migraine 02/03/2017   Essential hypertension 09/18/2008    PCP: Georgina Quint, MD   REFERRING PROVIDER: Rodolph Bong, MD  REFERRING DIAG: [phone] (ICD-10-CM) - Chronic pain of left knee   THERAPY DIAG:  Abnormal posture  Difficulty in walking, not elsewhere classified  Chronic pain of left knee  Muscle weakness (generalized)  Unsteadiness on feet  Rationale for Evaluation and  Treatment: Rehabilitation  ONSET DATE: 12/03/22  SUBJECTIVE:   SUBJECTIVE STATEMENT: Her knee pain is continuing to improve.  PERTINENT HISTORY: Per referring physician note: 77 y.o. female with left knee pain due to exacerbation of DJD.  She was seen about 2 months ago and had steroid injection which worked pretty well.  She still having some pain.  We talked about how to maximize conservative management and have decided on physical therapy.  Also recommend Tylenol and Voltaren gel.  Will work on authorization for hyaluronic acid injections and Zilretta injections.  However plan a recheck in about a month.  At that time could do conventional steroid injections or the second line injections listed above.  Visit conducted using a Spanish interpreter.  PAIN:  Are you having pain? Yes: NPRS scale: 6/10 Pain location: L knee Pain description: tingling Aggravating factors: steps, having to walk while carrying weight Relieving factors: Tylenol, sometimes heat or ice helps  PRECAUTIONS: Fall  RED FLAGS: None   WEIGHT BEARING RESTRICTIONS: No  FALLS:  Has patient fallen in last 6 months? No  LIVING ENVIRONMENT: Lives with: lives alone Lives in: House/apartment Stairs: Yes: External: 14 steps; bilateral but cannot reach both Plans to move to first floor Has following equipment at home: Single point cane only uses when her knee hurts  OCCUPATION: N/A, when the pain is worse, it impedes her.  PLOF: Independent  PATIENT GOALS: Patient would like to get better.  NEXT MD VISIT: unknown  OBJECTIVE:  Note: Objective measures were completed at Evaluation unless otherwise noted.  DIAGNOSTIC FINDINGS:  L knee xray 12/03/22 IMPRESSION: Negative.  COGNITION: Overall cognitive status: Within functional limits for tasks assessed     SENSATION: WFL  EDEMA:  Patient denies swelling  MUSCLE LENGTH: Hamstrings: Right 55 deg; Left 55 deg  POSTURE: rounded shoulders, forward head,  decreased lumbar lordosis, and narrow BOS with mildly externally rotated feet, B knees in ext.  PALPATION: L knee TTP medial joint line and posterior. L foot has an area in medial plantar surface of foot, just ant to calcaneus, which is calloused, mildly TTP. She reports that this is residual from the removal of a cancerous growth.   LOWER EXTREMITY ROM: B hips and ankles WFL  Active ROM Right eval Left eval  Hip flexion    Hip extension    Hip abduction    Hip adduction    Hip internal rotation    Hip external rotation    Knee flexion 135 121  Knee extension    Ankle dorsiflexion    Ankle plantarflexion    Ankle inversion    Ankle eversion     (Blank rows = not tested)  LOWER EXTREMITY MMT:  MMT Right eval Left eval  Hip flexion 4 4-  Hip extension 3+ 3  Hip abduction 4- 3+  Hip adduction    Hip internal rotation    Hip external rotation    Knee flexion 4 4-  Knee extension 4 4-  Ankle dorsiflexion 5 5  Ankle plantarflexion 4- 3  Ankle inversion    Ankle eversion     (Blank rows = not tested)  LOWER EXTREMITY SPECIAL TESTS:  Knee special tests: Apley's compression test: negative  GAIT: Distance walked: In clinic distances Assistive device utilized: None Level of assistance: Modified independence Comments: Patient uses a cane only if her knee pain is present. No major gait deviations observed. However, when standing and walking barefoot, she holds L foot in supination to avoid WB through the medial/inferior border of her foot.   TODAY'S TREATMENT:                                                                                                                              DATE:  02/02/23 NuStep L5 x 6 minutes Reviewed HEP for appropriate challenge Seated HS curl BLE 25#, 2 x 10 reps Seated knee ext BLE 5#, 1 x 10 Step ups onto Airex plank on top of airex pad B side stepping on airex x 5 each direction.  01/28/23:  Nustep L5, 6 min Ue's and LE's Supine for  hamstring stretch with strap, 10 sec holds, 6 reps, emphasis on stretch medial hamstring with some Lhip abduction  Standing B plantarflexor stretch, forefeet on bar, cues to maintain hips over ankles Standing hip extension with green theraband multiple  reps  Seated L plantarflexion strengthening with blue t band Seated long arc quads with 3 # 10 x3 Reviewed home program and reduced somewhat with therex that overlap  01/14/23 NuStep L5 x 6 minutes Seated knee flex and ext against G tband resistance, x 10 Mini squats x 10 Standing shoulder ext, rows, ER against G tband, 1-2 x 10 each B side to side step against G tband resistance at knees x 10  01/08/23 NuStep L 5 x 6 min Supine LE strengthening- bridge, clamshells, ball squeeze. SLR with 3#, SLR with hip ER Seated long kicks with 3# Standing hip abd, ext, heel raises x 10 each with 3# Upside down BOSU ball standing weight shifts ant/post and lat Side stepping on airex plank x 5 each way.  12/31/22 NuStep L4 x 6 min L knee PROM and STM to medial L knee   Patellar mobs HS curls red 2x10 LAQ 3lb 2x10 4in step ups x10 each 4in lateral step ups  Seated HS stretch  12/24/22 NuStep L 3 x 6 min MT Bilat  Knee PROM  Patellar mobs LAQ 3lb 2x10 HS curls green 2x10 S2S holding yellow ball 2x10 Hip add ball squeeze 2x10   12/17/22 Education    PATIENT EDUCATION:  Education details: POC Person educated: Patient and interpreter Education method: Explanation Education comprehension: verbalized understanding  HOME EXERCISE PROGRAM: [id number]  ASSESSMENT:  CLINICAL IMPRESSION: Patient reports that she continues to improve. Consistently performing HEP. Reviewed the exercises in her HEP, most of which are still appropriate. Some of them have become easy, but encouraged to perform the easier ones on the days when her knee pain is higher just to keep it moving. Also continued with strengthening exercises incorporating proprioception  as well.  OBJECTIVE IMPAIRMENTS: Abnormal gait, decreased activity tolerance, decreased balance, decreased coordination, decreased endurance, difficulty walking, decreased ROM, decreased strength, improper body mechanics, postural dysfunction, and pain.   ACTIVITY LIMITATIONS: bending, sitting, standing, squatting, sleeping, stairs, transfers, and locomotion level  PARTICIPATION LIMITATIONS: meal prep, cleaning, laundry, shopping, and community activity  PERSONAL FACTORS: Past/current experiences are also affecting patient's functional outcome.   REHAB POTENTIAL: Good  CLINICAL DECISION MAKING: Stable/uncomplicated  EVALUATION COMPLEXITY: Low   GOALS: Goals reviewed with patient? Yes  SHORT TERM GOALS: Target date: 01/02/23 I with initial HEP Baseline: Goal status: 01/08/23-met  LONG TERM GOALS: Target date: 02/25/23  I with final HEP Baseline:  Goal status: INITIAL  2.  Increase BLE strength to at least 4+/5 Baseline:  Goal status: INITIAL  3.  Patient will report at least 50% improvement in her overall pain Baseline:  Goal status: 02/02/23-30% improved, ongoing.  4.  Patient will be able to return to her normal daily activities with pain < 3/10 Baseline:  Goal status: Progressing, reports no limitaiton 12/31/27  5.  Ambulate at least 400' with LRAD, L knee pain < 3/10 Baseline:  Goal status: INITIAL  PLAN:  PT FREQUENCY: 2x/week  PT DURATION: 10 weeks  PLANNED INTERVENTIONS: 97110-Therapeutic exercises, 97530- Therapeutic activity, [id number]- Neuromuscular re-education, 97535- Self Care, 16109- Manual therapy, [phone]- Gait training, [phone]- Vasopneumatic device, [phone]- Ionotophoresis 4mg /ml Dexamethasone, Patient/Family education, Balance training, Stair training, Dry Needling, Cryotherapy, and Moist heat  PLAN FOR NEXT SESSION: reassess ROM and strength, continue to progress hip and ankle strength   Oley Balm DPT 02/02/23 4:29 PM

## 2023-02-09 ENCOUNTER — Ambulatory Visit: Payer: 59 | Admitting: Physical Therapy

## 2023-02-12 ENCOUNTER — Other Ambulatory Visit: Payer: Self-pay | Admitting: Emergency Medicine

## 2023-02-12 ENCOUNTER — Telehealth: Payer: Self-pay | Admitting: Emergency Medicine

## 2023-02-12 DIAGNOSIS — J449 Chronic obstructive pulmonary disease, unspecified: Secondary | ICD-10-CM

## 2023-02-12 MED ORDER — TRELEGY ELLIPTA 100-62.5-25 MCG/ACT IN AEPB: INHALATION_SPRAY | RESPIRATORY_TRACT | 3 refills | Status: DC

## 2023-02-12 NOTE — Telephone Encounter (Signed)
Caller & Relationship to patient: Self  Call back number: [phone]   Date of last office visit: 11.14.24  Date of next office visit: N/A  Medication(s) to be refilled:  Fluticasone-Umeclidin-Vilant (TRELEGY ELLIPTA) 100-62.5-25 MCG/ACT AEPB    Preferred Pharmacy:  [id number]   Phone: [phone]  Fax: [phone]   Pharmacy told pt there are no refills available

## 2023-02-20 ENCOUNTER — Encounter: Payer: Self-pay | Admitting: Physical Therapy

## 2023-02-20 ENCOUNTER — Ambulatory Visit: Payer: 59 | Admitting: Physical Therapy

## 2023-02-20 DIAGNOSIS — G8929 Other chronic pain: Secondary | ICD-10-CM | POA: Diagnosis not present

## 2023-02-20 DIAGNOSIS — M6281 Muscle weakness (generalized): Secondary | ICD-10-CM | POA: Diagnosis not present

## 2023-02-20 DIAGNOSIS — R262 Difficulty in walking, not elsewhere classified: Secondary | ICD-10-CM

## 2023-02-20 DIAGNOSIS — M25562 Pain in left knee: Secondary | ICD-10-CM | POA: Diagnosis not present

## 2023-02-20 DIAGNOSIS — R2681 Unsteadiness on feet: Secondary | ICD-10-CM

## 2023-02-20 DIAGNOSIS — R293 Abnormal posture: Secondary | ICD-10-CM

## 2023-02-20 NOTE — Therapy (Signed)
OUTPATIENT PHYSICAL THERAPY LOWER EXTREMITY TREATMENT   Patient Name: Dana Daniels MRN: [id number] DOB:September 28, 1945, 77 y.o., female Today's Date: 02/20/2023  END OF SESSION:  PT End of Session - 02/20/23 0945     Visit Number 8    Date for PT Re-Evaluation 02/25/23    PT Start Time 0936    PT Stop Time 1015    PT Time Calculation (min) 39 min    Activity Tolerance Patient tolerated treatment well    Behavior During Therapy [facility] for tasks assessed/performed             Past Medical History:  Diagnosis Date   Allergy    Arthritis    Asthma    Blood transfusion without reported diagnosis    Cataract    Hyperlipidemia    Hypertension    Migraine    Osteoporosis    Sleep apnea    Past Surgical History:  Procedure Laterality Date   ABDOMINAL HYSTERECTOMY     CHOLECYSTECTOMY     EYE SURGERY Left    cataract    FOOT SURGERY Left    Patient Active Problem List   Diagnosis Date Noted   Chronic insomnia 09/30/2022   Allergic dermatitis 08/21/2022   Bilateral foot pain 08/05/2021   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   Seasonal allergies 03/31/2018   History of gastroesophageal reflux (GERD) 09/25/2017   Osteoarthritis of multiple joints 03/24/2017   Chronic pain of left knee 03/24/2017   Chronic obstructive pulmonary disease (HCC) 03/24/2017   Primary cancer of skin of left foot 02/03/2017   Mild intermittent asthma without complication 02/03/2017   Obstructive sleep apnea 02/03/2017   Dyslipidemia 02/03/2017   Migraine 02/03/2017   Essential hypertension 09/18/2008    PCP: Georgina Quint, MD   REFERRING PROVIDER: Rodolph Bong, MD  REFERRING DIAG: [phone] (ICD-10-CM) - Chronic pain of left knee   THERAPY DIAG:  Abnormal posture  Difficulty in walking, not elsewhere classified  Chronic pain of left knee  Muscle weakness (generalized)  Unsteadiness on feet  Rationale for Evaluation and  Treatment: Rehabilitation  ONSET DATE: 12/03/22  SUBJECTIVE:   SUBJECTIVE STATEMENT: Patient reports that her knee was feeling better, but she danced with her son and her knee pain is back. It may also be due to the weather. She missed her appointments due to asthma.  PERTINENT HISTORY: Per referring physician note: 77 y.o. female with left knee pain due to exacerbation of DJD.  She was seen about 2 months ago and had steroid injection which worked pretty well.  She still having some pain.  We talked about how to maximize conservative management and have decided on physical therapy.  Also recommend Tylenol and Voltaren gel.  Will work on authorization for hyaluronic acid injections and Zilretta injections.  However plan a recheck in about a month.  At that time could do conventional steroid injections or the second line injections listed above.  Visit conducted using a Spanish interpreter.  PAIN:  Are you having pain? Yes: NPRS scale: 6/10 Pain location: L knee Pain description: tingling Aggravating factors: steps, having to walk while carrying weight Relieving factors: Tylenol, sometimes heat or ice helps  PRECAUTIONS: Fall  RED FLAGS: None   WEIGHT BEARING RESTRICTIONS: No  FALLS:  Has patient fallen in last 6 months? No  LIVING ENVIRONMENT: Lives with: lives alone Lives in: House/apartment Stairs: Yes: External: 14 steps; bilateral but cannot reach both Plans to move  to first floor Has following equipment at home: Single point cane only uses when her knee hurts  OCCUPATION: N/A, when the pain is worse, it impedes her.  PLOF: Independent  PATIENT GOALS: Patient would like to get better.  NEXT MD VISIT: unknown  OBJECTIVE:  Note: Objective measures were completed at Evaluation unless otherwise noted.  DIAGNOSTIC FINDINGS:  L knee xray 12/03/22 IMPRESSION: Negative.  COGNITION: Overall cognitive status: Within functional limits for tasks  assessed     SENSATION: WFL  EDEMA:  Patient denies swelling  MUSCLE LENGTH: Hamstrings: Right 55 deg; Left 55 deg  POSTURE: rounded shoulders, forward head, decreased lumbar lordosis, and narrow BOS with mildly externally rotated feet, B knees in ext.  PALPATION: L knee TTP medial joint line and posterior. L foot has an area in medial plantar surface of foot, just ant to calcaneus, which is calloused, mildly TTP. She reports that this is residual from the removal of a cancerous growth.   LOWER EXTREMITY ROM: B hips and ankles WFL  Active ROM Right eval Left eval  Hip flexion    Hip extension    Hip abduction    Hip adduction    Hip internal rotation    Hip external rotation    Knee flexion 135 121  Knee extension    Ankle dorsiflexion    Ankle plantarflexion    Ankle inversion    Ankle eversion     (Blank rows = not tested)  LOWER EXTREMITY MMT:  MMT Right eval Left eval  Hip flexion 4 4-  Hip extension 3+ 3  Hip abduction 4- 3+  Hip adduction    Hip internal rotation    Hip external rotation    Knee flexion 4 4-  Knee extension 4 4-  Ankle dorsiflexion 5 5  Ankle plantarflexion 4- 3  Ankle inversion    Ankle eversion     (Blank rows = not tested)  LOWER EXTREMITY SPECIAL TESTS:  Knee special tests: Apley's compression test: negative  GAIT: Distance walked: In clinic distances Assistive device utilized: None Level of assistance: Modified independence Comments: Patient uses a cane only if her knee pain is present. No major gait deviations observed. However, when standing and walking barefoot, she holds L foot in supination to avoid WB through the medial/inferior border of her foot.   TODAY'S TREATMENT:                                                                                                                              DATE:  02/20/23 NuStep L3 x 6 minutes Stop HS stretch with strap due to knee pain, added seated HS stretch Seated HS curls,  BLE 25#, 1 x 10, L LE 15#, 2 x 10  Seated knee ext, BLE 5#, 1 x 10, LLE eccentric 5#, 2 x 10 Standing on Airex pad, no UE support, ant/post weight shifts, lat weight shifts, standing with narrow BOS, occ CGA for balance  Seated iso hip add against ball, 5 sec x 10,  Seated resisted clamshells against G tband resistance, 2 x 10 reps. Standing hip abd against G tband, 2 x 10 each leg.   02/02/23 NuStep L5 x 6 minutes Reviewed HEP for appropriate challenge Seated HS curl BLE 25#, 2 x 10 reps Seated knee ext BLE 5#, 1 x 10 Step ups onto Airex plank on top of airex pad B side stepping on airex x 5 each direction.  01/28/23:  Nustep L5, 6 min Ue's and LE's Supine for hamstring stretch with strap, 10 sec holds, 6 reps, emphasis on stretch medial hamstring with some Lhip abduction  Standing B plantarflexor stretch, forefeet on bar, cues to maintain hips over ankles Standing hip extension with green theraband multiple reps  Seated L plantarflexion strengthening with blue t band Seated long arc quads with 3 # 10 x3 Reviewed home program and reduced somewhat with therex that overlap  01/14/23 NuStep L5 x 6 minutes Seated knee flex and ext against G tband resistance, x 10 Mini squats x 10 Standing shoulder ext, rows, ER against G tband, 1-2 x 10 each B side to side step against G tband resistance at knees x 10  01/08/23 NuStep L 5 x 6 min Supine LE strengthening- bridge, clamshells, ball squeeze. SLR with 3#, SLR with hip ER Seated long kicks with 3# Standing hip abd, ext, heel raises x 10 each with 3# Upside down BOSU ball standing weight shifts ant/post and lat Side stepping on airex plank x 5 each way.  12/31/22 NuStep L4 x 6 min L knee PROM and STM to medial L knee   Patellar mobs HS curls red 2x10 LAQ 3lb 2x10 4in step ups x10 each 4in lateral step ups  Seated HS stretch  12/24/22 NuStep L 3 x 6 min MT Bilat  Knee PROM  Patellar mobs LAQ 3lb 2x10 HS curls green 2x10 S2S  holding yellow ball 2x10 Hip add ball squeeze 2x10  12/17/22 Education   PATIENT EDUCATION:  Education details: POC Person educated: Patient and interpreter Education method: Explanation Education comprehension: verbalized understanding  HOME EXERCISE PROGRAM: [id number]  ASSESSMENT:  CLINICAL IMPRESSION: Patient reports that her knee pain is exacerbated again after more walking than usual and dancing with her son. The weather may also be affecting the pain. She tolerated all exercises well and reported that they helped her knee to feel better.  OBJECTIVE IMPAIRMENTS: Abnormal gait, decreased activity tolerance, decreased balance, decreased coordination, decreased endurance, difficulty walking, decreased ROM, decreased strength, improper body mechanics, postural dysfunction, and pain.   ACTIVITY LIMITATIONS: bending, sitting, standing, squatting, sleeping, stairs, transfers, and locomotion level  PARTICIPATION LIMITATIONS: meal prep, cleaning, laundry, shopping, and community activity  PERSONAL FACTORS: Past/current experiences are also affecting patient's functional outcome.   REHAB POTENTIAL: Good  CLINICAL DECISION MAKING: Stable/uncomplicated  EVALUATION COMPLEXITY: Low   GOALS: Goals reviewed with patient? Yes  SHORT TERM GOALS: Target date: 01/02/23 I with initial HEP Baseline: Goal status: 01/08/23-met  LONG TERM GOALS: Target date: 02/25/23  I with final HEP Baseline:  Goal status: INITIAL  2.  Increase BLE strength to at least 4+/5 Baseline:  Goal status: INITIAL  3.  Patient will report at least 50% improvement in her overall pain Baseline:  Goal status: 02/02/23-30% improved, ongoing.  4.  Patient will be able to return to her normal daily activities with pain < 3/10 Baseline:  Goal status: Progressing, reports no limitaiton 12/31/27  5.  Ambulate at  least 400' with LRAD, L knee pain < 3/10 Baseline:  Goal status: INITIAL  PLAN:  PT FREQUENCY:  2x/week  PT DURATION: 10 weeks  PLANNED INTERVENTIONS: 97110-Therapeutic exercises, 97530- Therapeutic activity, [id number]- Neuromuscular re-education, 97535- Self Care, 04540- Manual therapy, [phone]- Gait training, [phone]- Vasopneumatic device, [phone]- Ionotophoresis 4mg /ml Dexamethasone, Patient/Family education, Balance training, Stair training, Dry Needling, Cryotherapy, and Moist heat  PLAN FOR NEXT SESSION: reassess ROM and strength, continue to progress hip and ankle strength   Oley Balm DPT 02/20/23 10:13 AM

## 2023-02-26 ENCOUNTER — Encounter: Payer: Self-pay | Admitting: Physical Therapy

## 2023-02-26 ENCOUNTER — Ambulatory Visit: Payer: 59 | Admitting: Physical Therapy

## 2023-02-26 DIAGNOSIS — G8929 Other chronic pain: Secondary | ICD-10-CM

## 2023-02-26 DIAGNOSIS — R293 Abnormal posture: Secondary | ICD-10-CM | POA: Diagnosis not present

## 2023-02-26 DIAGNOSIS — R2681 Unsteadiness on feet: Secondary | ICD-10-CM | POA: Diagnosis not present

## 2023-02-26 DIAGNOSIS — R262 Difficulty in walking, not elsewhere classified: Secondary | ICD-10-CM | POA: Diagnosis not present

## 2023-02-26 DIAGNOSIS — M25562 Pain in left knee: Secondary | ICD-10-CM | POA: Diagnosis not present

## 2023-02-26 DIAGNOSIS — M6281 Muscle weakness (generalized): Secondary | ICD-10-CM

## 2023-02-26 NOTE — Therapy (Signed)
OUTPATIENT PHYSICAL THERAPY LOWER EXTREMITY TREATMENT  PHYSICAL THERAPY DISCHARGE SUMMARY  Visits from Start of Care: 9  Current functional level related to goals / functional outcomes: Most goals met. She has had improved pain management with her HEP   Remaining deficits: Patient continues to have pain   Education / Equipment: HEP   Patient agrees to discharge. Patient goals were partially met. Patient is being discharged due to being pleased with the current functional level.   Patient Name: Dana Daniels MRN: [id number] DOB:05-02-1945, 77 y.o., female Today's Date: 02/26/2023  END OF SESSION:  PT End of Session - 02/26/23 1158     Visit Number 9    PT Start Time 1152    PT Stop Time 1215    PT Time Calculation (min) 23 min    Activity Tolerance Patient tolerated treatment well    Behavior During Therapy WFL for tasks assessed/performed              Past Medical History:  Diagnosis Date   Allergy    Arthritis    Asthma    Blood transfusion without reported diagnosis    Cataract    Hyperlipidemia    Hypertension    Migraine    Osteoporosis    Sleep apnea    Past Surgical History:  Procedure Laterality Date   ABDOMINAL HYSTERECTOMY     CHOLECYSTECTOMY     EYE SURGERY Left    cataract    FOOT SURGERY Left    Patient Active Problem List   Diagnosis Date Noted   Chronic insomnia 09/30/2022   Allergic dermatitis 08/21/2022   Bilateral foot pain 08/05/2021   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   Seasonal allergies 03/31/2018   History of gastroesophageal reflux (GERD) 09/25/2017   Osteoarthritis of multiple joints 03/24/2017   Chronic pain of left knee 03/24/2017   Chronic obstructive pulmonary disease (HCC) 03/24/2017   Primary cancer of skin of left foot 02/03/2017   Mild intermittent asthma without complication 02/03/2017   Obstructive sleep apnea 02/03/2017   Dyslipidemia 02/03/2017   Migraine  02/03/2017   Essential hypertension 09/18/2008    PCP: Georgina Quint, MD   REFERRING PROVIDER: Rodolph Bong, MD  REFERRING DIAG: [phone] (ICD-10-CM) - Chronic pain of left knee   THERAPY DIAG:  Abnormal posture  Difficulty in walking, not elsewhere classified  Chronic pain of left knee  Muscle weakness (generalized)  Unsteadiness on feet  Rationale for Evaluation and Treatment: Rehabilitation  ONSET DATE: 12/03/22  SUBJECTIVE:   SUBJECTIVE STATEMENT: Patient reports increased knee pain, possibly due to the weather.  PERTINENT HISTORY: Per referring physician note: 77 y.o. female with left knee pain due to exacerbation of DJD.  She was seen about 2 months ago and had steroid injection which worked pretty well.  She still having some pain.  We talked about how to maximize conservative management and have decided on physical therapy.  Also recommend Tylenol and Voltaren gel.  Will work on authorization for hyaluronic acid injections and Zilretta injections.  However plan a recheck in about a month.  At that time could do conventional steroid injections or the second line injections listed above.  Visit conducted using a Spanish interpreter.  PAIN:  Are you having pain? Yes: NPRS scale: 6/10 Pain location: L knee Pain description: tingling Aggravating factors: steps, having to walk while carrying weight Relieving factors: Tylenol, sometimes heat or ice helps  PRECAUTIONS: Fall  RED FLAGS:  None   WEIGHT BEARING RESTRICTIONS: No  FALLS:  Has patient fallen in last 6 months? No  LIVING ENVIRONMENT: Lives with: lives alone Lives in: House/apartment Stairs: Yes: External: 14 steps; bilateral but cannot reach both Plans to move to first floor Has following equipment at home: Single point cane only uses when her knee hurts  OCCUPATION: N/A, when the pain is worse, it impedes her.  PLOF: Independent  PATIENT GOALS: Patient would like to get  better.  NEXT MD VISIT: unknown  OBJECTIVE:  Note: Objective measures were completed at Evaluation unless otherwise noted.  DIAGNOSTIC FINDINGS:  L knee xray 12/03/22 IMPRESSION: Negative.  COGNITION: Overall cognitive status: Within functional limits for tasks assessed     SENSATION: WFL  EDEMA:  Patient denies swelling  MUSCLE LENGTH: Hamstrings: Right 55 deg; Left 55 deg  POSTURE: rounded shoulders, forward head, decreased lumbar lordosis, and narrow BOS with mildly externally rotated feet, B knees in ext.  PALPATION: L knee TTP medial joint line and posterior. L foot has an area in medial plantar surface of foot, just ant to calcaneus, which is calloused, mildly TTP. She reports that this is residual from the removal of a cancerous growth.   LOWER EXTREMITY ROM: B hips and ankles WFL  Active ROM Right eval Left eval  Hip flexion    Hip extension    Hip abduction    Hip adduction    Hip internal rotation    Hip external rotation    Knee flexion 135 121  Knee extension    Ankle dorsiflexion    Ankle plantarflexion    Ankle inversion    Ankle eversion     (Blank rows = not tested)  LOWER EXTREMITY MMT:  MMT Right eval Left eval  Hip flexion 4 4-  Hip extension 3+ 3  Hip abduction 4- 3+  Hip adduction    Hip internal rotation    Hip external rotation    Knee flexion 4 4-  Knee extension 4 4-  Ankle dorsiflexion 5 5  Ankle plantarflexion 4- 3  Ankle inversion    Ankle eversion     (Blank rows = not tested)  LOWER EXTREMITY SPECIAL TESTS:  Knee special tests: Apley's compression test: negative  GAIT: Distance walked: In clinic distances Assistive device utilized: None Level of assistance: Modified independence Comments: Patient uses a cane only if her knee pain is present. No major gait deviations observed. However, when standing and walking barefoot, she holds L foot in supination to avoid WB through the medial/inferior border of her  foot.   TODAY'S TREATMENT:                                                                                                                              DATE:  02/26/23 NuStep L3 x 6 minutes Goals re-assessed, HEP reviewed   02/20/23 NuStep L3 x 6 minutes Stop HS stretch with strap due to knee pain, added  seated HS stretch Seated HS curls, BLE 25#, 1 x 10, L LE 15#, 2 x 10  Seated knee ext, BLE 5#, 1 x 10, LLE eccentric 5#, 2 x 10 Standing on Airex pad, no UE support, ant/post weight shifts, lat weight shifts, standing with narrow BOS, occ CGA for balance Seated iso hip add against ball, 5 sec x 10,  Seated resisted clamshells against G tband resistance, 2 x 10 reps. Standing hip abd against G tband, 2 x 10 each leg.  02/02/23 NuStep L5 x 6 minutes Reviewed HEP for appropriate challenge Seated HS curl BLE 25#, 2 x 10 reps Seated knee ext BLE 5#, 1 x 10 Step ups onto Airex plank on top of airex pad B side stepping on airex x 5 each direction.  01/28/23:  Nustep L5, 6 min Ue's and LE's Supine for hamstring stretch with strap, 10 sec holds, 6 reps, emphasis on stretch medial hamstring with some Lhip abduction  Standing B plantarflexor stretch, forefeet on bar, cues to maintain hips over ankles Standing hip extension with green theraband multiple reps  Seated L plantarflexion strengthening with blue t band Seated long arc quads with 3 # 10 x3 Reviewed home program and reduced somewhat with therex that overlap  01/14/23 NuStep L5 x 6 minutes Seated knee flex and ext against G tband resistance, x 10 Mini squats x 10 Standing shoulder ext, rows, ER against G tband, 1-2 x 10 each B side to side step against G tband resistance at knees x 10  01/08/23 NuStep L 5 x 6 min Supine LE strengthening- bridge, clamshells, ball squeeze. SLR with 3#, SLR with hip ER Seated long kicks with 3# Standing hip abd, ext, heel raises x 10 each with 3# Upside down BOSU ball standing weight shifts  ant/post and lat Side stepping on airex plank x 5 each way.  12/31/22 NuStep L4 x 6 min L knee PROM and STM to medial L knee   Patellar mobs HS curls red 2x10 LAQ 3lb 2x10 4in step ups x10 each 4in lateral step ups  Seated HS stretch  12/24/22 NuStep L 3 x 6 min MT Bilat  Knee PROM  Patellar mobs LAQ 3lb 2x10 HS curls green 2x10 S2S holding yellow ball 2x10 Hip add ball squeeze 2x10  12/17/22 Education   PATIENT EDUCATION:  Education details: POC Person educated: Patient and interpreter Education method: Explanation Education comprehension: verbalized understanding  HOME EXERCISE PROGRAM: [id number]  ASSESSMENT:  CLINICAL IMPRESSION: Patient reports that her knee pain is much better overall. She consistently performs her HEP and feels it has really helped her pain. She has even lost a bit of weight. She has a follow up with her Dr in Feb and would benefit from gel injections due to the chronic nature of her pain.  OBJECTIVE IMPAIRMENTS: Abnormal gait, decreased activity tolerance, decreased balance, decreased coordination, decreased endurance, difficulty walking, decreased ROM, decreased strength, improper body mechanics, postural dysfunction, and pain.   ACTIVITY LIMITATIONS: bending, sitting, standing, squatting, sleeping, stairs, transfers, and locomotion level  PARTICIPATION LIMITATIONS: meal prep, cleaning, laundry, shopping, and community activity  PERSONAL FACTORS: Past/current experiences are also affecting patient's functional outcome.   REHAB POTENTIAL: Good  CLINICAL DECISION MAKING: Stable/uncomplicated  EVALUATION COMPLEXITY: Low   GOALS: Goals reviewed with patient? Yes  SHORT TERM GOALS: Target date: 01/02/23 I with initial HEP Baseline: Goal status: 01/08/23-met  LONG TERM GOALS: Target date: 02/25/23  I with final HEP Baseline:  Goal status: 02/26/23-met  2.  Increase BLE strength to at least 4+/5 Baseline:  Goal status:  02/26/23-met  3.  Patient will report at least 50% improvement in her overall pain Baseline:  Goal status: 02/26/23-met.  4.  Patient will be able to return to her normal daily activities with pain < 3/10 Baseline:  Goal status: 02/26/23-patient reports max pain of 4/10, partially met  5.  Ambulate at least 400' with LRAD, L knee pain < 3/10 Baseline:  Goal status: 02/26/23 met  PLAN:  PT FREQUENCY: 2x/week  PT DURATION: 10 weeks  PLANNED INTERVENTIONS: 97110-Therapeutic exercises, 97530- Therapeutic activity, 97112- Neuromuscular re-education, 97535- Self Care, 16109- Manual therapy, 97116- Gait training, [phone]- Vasopneumatic device, [phone]- Ionotophoresis 4mg /ml Dexamethasone, Patient/Family education, Balance training, Stair training, Dry Needling, Cryotherapy, and Moist heat  PLAN FOR NEXT SESSION: reassess ROM and strength, continue to progress hip and ankle strength   Oley Balm DPT 02/26/23 12:26 PM

## 2023-03-04 DIAGNOSIS — G4733 Obstructive sleep apnea (adult) (pediatric): Secondary | ICD-10-CM | POA: Diagnosis not present

## 2023-03-05 NOTE — Telephone Encounter (Signed)
 VOB initiated for Zilretta for LEFT knee OA

## 2023-03-09 NOTE — Addendum Note (Signed)
 Addended by: Dierdre Searles on: 03/09/2023 11:54 AM   Modules accepted: Orders

## 2023-03-09 NOTE — Telephone Encounter (Signed)
 [employer] and [employer] - Prior Authorization NOT required  [employer] - Prior Authorization REQUIRED

## 2023-03-12 NOTE — Telephone Encounter (Signed)
 Prior Authorization initiated for [facility] via [email or web address] KEY: [id number]

## 2023-03-18 NOTE — Telephone Encounter (Signed)
 Prior Authorization for ZILRETTA  APPROVED PA# [id number] Valid: 03/12/23-03/02/24

## 2023-03-19 NOTE — Telephone Encounter (Signed)
VOB initiated for Gelsyn for LEFT knee OA 

## 2023-03-20 NOTE — Telephone Encounter (Signed)
[employer] and [facility] for LEFT knee OA   Primary Insurance: UHC Dual Complete Co-pay: n/a Co-insurance: 20% - can submit to Medicaid if eligible Deductible: $32.25 of $257 met - must be met for coverage to apply Prior Auth: NOT required  PCP referral REQUIRED - must send with claim    Knee Injection History 10/01/22 - Kenalog 01/06/24 - Zilretta RIGHT

## 2023-03-20 NOTE — Telephone Encounter (Signed)
[employer] - Prior Authorization NOT required  PCP referral REQUIRED - must send with claim

## 2023-03-26 NOTE — Telephone Encounter (Signed)
ZILRETTA for LEFT knee OA  OK to schedule on or after 04/01/23  [employer] and [employer]  Primary Insurance: [facility] Medicare Adv Dual Complete Co-pay: n/a Co-insurance: 20% Deductible: $ of $257 met - must be met for coverage to apply Prior Auth: NOT required  Pharmacy Benefit  Prior Auth: APPROVED PA# [id number] Valid: 03/12/23-03/02/24  Can send to [facility] if pt would like to check the medication cost through pharmacy benefits   Knee Injection History 10/01/22 - Kenalog 01/06/23 - Zilretta LEFT

## 2023-03-27 NOTE — Telephone Encounter (Signed)
Holding until needed by patient.

## 2023-04-14 ENCOUNTER — Ambulatory Visit: Payer: 59 | Admitting: Neurology

## 2023-04-20 ENCOUNTER — Ambulatory Visit: Payer: 59 | Admitting: Emergency Medicine

## 2023-04-25 DIAGNOSIS — G4733 Obstructive sleep apnea (adult) (pediatric): Secondary | ICD-10-CM | POA: Diagnosis not present

## 2023-05-08 DIAGNOSIS — G4733 Obstructive sleep apnea (adult) (pediatric): Secondary | ICD-10-CM | POA: Diagnosis not present

## 2023-05-15 ENCOUNTER — Encounter: Payer: Self-pay | Admitting: Family Medicine

## 2023-05-15 ENCOUNTER — Other Ambulatory Visit: Payer: Self-pay

## 2023-05-15 ENCOUNTER — Ambulatory Visit (INDEPENDENT_AMBULATORY_CARE_PROVIDER_SITE_OTHER): Admitting: Family Medicine

## 2023-05-15 VITALS — BP 138/70 | HR 79 | Ht 61.0 in | Wt 168.0 lb

## 2023-05-15 DIAGNOSIS — G8929 Other chronic pain: Secondary | ICD-10-CM | POA: Diagnosis not present

## 2023-05-15 DIAGNOSIS — M1712 Unilateral primary osteoarthritis, left knee: Secondary | ICD-10-CM | POA: Diagnosis not present

## 2023-05-15 DIAGNOSIS — M25562 Pain in left knee: Secondary | ICD-10-CM | POA: Diagnosis not present

## 2023-05-15 NOTE — Progress Notes (Signed)
   I, Stevenson Clinch, CMA acting as a scribe for Clementeen Graham, MD.  Lady Wisham Colon is a 78 y.o. female who presents to [facility] today for L knee pain. Pt was last seen by Dr. Denyse Amass 01/06/23 and was given a L knee Zilretta injection.  Today, pt reports worsening over the past 3 months, got about 1 month of relief with last Zilretta injection. Denies visible swelling in the knee. Locates pain to medial aspect of the knee, denies radicular sx. Occasional popping in the knee, non-painful. Has been taking Tylenol with minimal relief. Has been for PT, this helped some.   Dx imaging: 08/21/22 L knee XR   Pertinent review of systems: No fevers or chills  Relevant historical information: Hypertension   Exam:  BP 138/70   Pulse 79   Ht 5\' 1"  (1.549 m)   Wt 168 lb (76.2 kg)   SpO2 96%   BMI 31.74 kg/m  General: Well Developed, well nourished, and in no acute distress.   MSK: Left knee minimal effusion normal motion.    Lab and Radiology Results  EXAM: LEFT KNEE - COMPLETE 4 VIEW   COMPARISON:  None Available.   FINDINGS: No evidence of fracture, dislocation, or joint effusion. No evidence of arthropathy or other focal bone abnormality. Soft tissues are unremarkable.   IMPRESSION: Negative.     Electronically Signed   By: Allegra Lai M.D.   On: 08/21/2022 11:51 I, Clementeen Graham, personally (independently) visualized and performed the interpretation of the images attached in this note.      Assessment and Plan: 78 y.o. female with chronic left knee pain.  Patient has only very minimal DJD on x-ray from June 2024.  Typical treatment for knee pain is not working very well.  She has a mismatch between pain and her imaging.  Will proceed to MRI of the knee to further evaluate source of pain and for further injection planning her surgery planning.  Will anticipate trying gel injections once we get the MRI back. Visit conducted using a  Spanish interpreter.  Patient speaks a fair amount of English but the interpreter is helpful for clarifying uncertainties.   PDMP not reviewed this encounter. Orders Placed This Encounter  Procedures   MR Knee Left  Wo Contrast    Standing Status:   Future    Expiration Date:   05/14/2024    What is the patient's sedation requirement?:   No Sedation    Does the patient have a pacemaker or implanted devices?:   No    Preferred imaging location?:   [facility] (table limit-550lbs)   No orders of the defined types were placed in this encounter.    Discussed warning signs or symptoms. Please see discharge instructions. Patient expresses understanding.   The above documentation has been reviewed and is accurate and complete Clementeen Graham, M.D.

## 2023-05-15 NOTE — Patient Instructions (Addendum)
 Thank you for coming in today.   You should hear from MRI scheduling within 1 week. If you do not hear please let me know.    Check back after we get the MRI results.

## 2023-05-23 DIAGNOSIS — G4733 Obstructive sleep apnea (adult) (pediatric): Secondary | ICD-10-CM | POA: Diagnosis not present

## 2023-05-26 ENCOUNTER — Ambulatory Visit: Admitting: Emergency Medicine

## 2023-05-26 NOTE — Progress Notes (Unsigned)
 Patient: Dana Daniels Date of Birth: [birth year]  Reason for Visit: Follow up History from: Patient Primary Neurologist: Frances Furbish   ASSESSMENT AND PLAN 78 y.o. year old female    HISTORY OF PRESENT ILLNESS: Today 05/26/23 Saw Dr. Frances Furbish September 2024 with history of CPAP usage for OSA.  An HST was ordered to qualify for a new CPAP machine.  HST 01/07/2023 showed severe OSA with total AHI 37/hour and O2 nadir of 76% with significant time below at 88% saturation of over 10 minutes for the study indicating nocturnal hypoxemia.  There was a mild central apnea as well.  HISTORY  11/11/22 Dr. Frances Furbish: I saw your patient, Dana Daniels, upon your kind request in my neurologic clinic today for initial consultation of her sleep disorder, in particular, evaluation of her prior diagnosis of obstructive sleep apnea.  The patient is unaccompanied today.  We utilized the remote video interpreter for this appointment, Spanish interpreter was [place]. As you know, Dana Daniels is a 78 year old female with an underlying medical history of allergies, arthritis, asthma, hypertension, hyperlipidemia, migraine headaches, osteoporosis, and mild obesity, who was previously diagnosed with obstructive sleep apnea and placed on PAP therapy.  She has an Epworth sleepiness score of 1/24, fatigue severity score is 14 out of 63.  She reports that she does not sleep well.  She has benefited from PAP therapy but has not used it in the past several months.  I was able to review her AutoPap compliance data for the past year, she used her machine reasonably consistently up until March of this year, very sporadically through June of this year but none since July of this year.  She has not had regular supplies, she uses nasal pillows, she actually benefited from treatment when she first started AutoPap therapy some 5 years ago.  She does not currently have a DME company.  It looks like she got her machine from  [facility].  She does not have a sleep specialist currently.  She does not sleep well and has chronic difficulty initiating and maintaining sleep, essentially since her husband passed away 29 years ago.  He had multiple strokes and was in a coma before he passed.  She has 3 grown children, 1 daughter and 2 sons, youngest son is 61.  She is retired since 2009.  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  She is a non-smoker and does not drink alcohol.  She drinks quite a bit of caffeine in the form of black tea, 3 to 4 cups/day and at night she will drink coffee with milk.  Bedtime is 1 or 2 AM and rise time somewhere between 6 and 7 AM.  She does not have nightly nocturia.  She does have occasional morning headaches.  Over the course of 20 years she had weight gain in the realm of 40 pounds.  She has been working on weight loss and has lost about 10 pounds in the past month and a half.  She had a tonsillectomy as a child. She had a home sleep test on 09/07/2017, study was interpreted by Dr. Cyril Mourning.  Her AHI was 12.1/h, average oxygen saturation 93%, nadir was 82%.   She takes Ambien as needed, 5 mg strength, on average maybe once a week.  REVIEW OF SYSTEMS: Out of a complete 14 system review of symptoms, the patient complains only of the following symptoms, and all other reviewed systems are negative.  See HPI  ALLERGIES: Allergies  Allergen Reactions   Tramadol Itching    HOME MEDICATIONS: Outpatient Medications Prior to Visit  Medication Sig Dispense Refill   albuterol (VENTOLIN HFA) 108 (90 Base) MCG/ACT inhaler Inhale 1-2 puffs into the lungs every 4 (four) hours as needed for wheezing or shortness of breath. 8 g 3   azithromycin (ZITHROMAX) 250 MG tablet Sig as indicated 6 tablet 0   CARTIA XT 240 MG 24 hr capsule Take 1 capsule by mouth once daily 90 capsule 0   cetirizine (ZYRTEC) 10 MG tablet Take 1 tablet (10 mg total) by mouth daily. 10 tablet 11   cromolyn (NASALCROM) 5.2  MG/ACT nasal spray Place 1 spray into both nostrils 2 (two) times daily. 26 mL 12   Fluticasone-Umeclidin-Vilant (TRELEGY ELLIPTA) 100-62.5-25 MCG/ACT AEPB Inhale 1 puff by mouth once daily 60 each 3   montelukast (SINGULAIR) 10 MG tablet Take 1 tablet (10 mg total) by mouth at bedtime. 90 tablet 3   nitroGLYCERIN (NITROSTAT) 0.4 MG SL tablet DISSOLVE ONE TABLET UNDER THE TONGUE EVERY 5 MINUTES AS NEEDED FOR CHEST PAIN.  DO NOT EXCEED A TOTAL OF 3 DOSES IN 15 MINUTES 25 tablet 0   omeprazole (PRILOSEC) 40 MG capsule Take 1 capsule (40 mg total) by mouth daily. 90 capsule 3   simvastatin (ZOCOR) 20 MG tablet Take 1 tablet (20 mg total) by mouth daily. 90 tablet 3   Ubrogepant (UBRELVY) 100 MG TABS Take 1 tablet (100 mg total) by mouth daily as needed. Take at onset of migraine headache 15 tablet 3   zolpidem (AMBIEN) 5 MG tablet Take 1 tablet (5 mg total) by mouth at bedtime as needed for sleep. 15 tablet 1   No facility-administered medications prior to visit.    PAST MEDICAL HISTORY: Past Medical History:  Diagnosis Date   Allergy    Arthritis    Asthma    Blood transfusion without reported diagnosis    Cataract    Hyperlipidemia    Hypertension    Migraine    Osteoporosis    Sleep apnea     PAST SURGICAL HISTORY: Past Surgical History:  Procedure Laterality Date   ABDOMINAL HYSTERECTOMY     CHOLECYSTECTOMY     EYE SURGERY Left    cataract    FOOT SURGERY Left     FAMILY HISTORY: Family History  Problem Relation Age of Onset   Heart disease Mother    Hyperlipidemia Mother    Hypertension Mother    Stroke Mother    Heart disease Father    Stroke Father    Diabetes Daughter    Hypertension Daughter    Hypertension Son    Hyperlipidemia Son    Fibromyalgia Son    Lupus Son    Sleep apnea Son    Hypertension Son    Sleep apnea Son     SOCIAL HISTORY: Social History   Socioeconomic History   Marital status: Widowed    Spouse name: Not on file   Number of  children: Not on file   Years of education: Not on file   Highest education level: Associate degree: occupational, Scientist, product/process development, or vocational program  Occupational History   Not on file  Tobacco Use   Smoking status: Never   Smokeless tobacco: Never  Vaping Use   Vaping status: Never Used  Substance and Sexual Activity   Alcohol use: No   Drug use: Never   Sexual activity: Not on file  Other Topics Concern   Not on file  Social History Narrative   Not on file   Social Drivers of Health   Financial Resource Strain: Patient Declined (05/26/2023)   Overall Financial Resource Strain (CARDIA)    Difficulty of Paying Living Expenses: Patient declined  Food Insecurity: Patient Declined (05/26/2023)   Hunger Vital Sign    Worried About Running Out of Food in the Last Year: Patient declined    Ran Out of Food in the Last Year: Patient declined  Transportation Needs: Patient Declined (05/26/2023)   PRAPARE - Administrator, Civil Service (Medical): Patient declined    Lack of Transportation (Non-Medical): Patient declined  Physical Activity: Insufficiently Active (05/26/2023)   Exercise Vital Sign    Days of Exercise per Week: 3 days    Minutes of Exercise per Session: 20 min  Stress: No Stress Concern Present (05/26/2023)   [employer] of Occupational Health - Occupational Stress Questionnaire    Feeling of Stress : Not at all  Social Connections: Unknown (05/26/2023)   Social Connection and Isolation Panel [NHANES]    Frequency of Communication with Friends and Family: More than three times a week    Frequency of Social Gatherings with Friends and Family: Twice a week    Attends Religious Services: Patient declined    Database administrator or Organizations: Patient declined    Attends Banker Meetings: Patient declined    Marital Status: Widowed  Intimate Partner Violence: Not At Risk (11/05/2022)   Humiliation, Afraid, Rape, and Kick questionnaire     Fear of Current or Ex-Partner: No    Emotionally Abused: No    Physically Abused: No    Sexually Abused: No    PHYSICAL EXAM  There were no vitals filed for this visit. There is no height or weight on file to calculate BMI.  Generalized: Well developed, in no acute distress  Neurological examination  Mentation: Alert oriented to time, place, history taking. Follows all commands speech and language fluent Cranial nerve II-XII: Pupils were equal round reactive to light. Extraocular movements were full, visual field were full on confrontational test. Facial sensation and strength were normal. Uvula tongue midline. Head turning and shoulder shrug  were normal and symmetric. Motor: The motor testing reveals 5 over 5 strength of all 4 extremities. Good symmetric motor tone is noted throughout.  Sensory: Sensory testing is intact to soft touch on all 4 extremities. No evidence of extinction is noted.  Coordination: Cerebellar testing reveals good finger-nose-finger and heel-to-shin bilaterally.  Gait and station: Gait is normal. Tandem gait is normal. Romberg is negative. No drift is seen.  Reflexes: Deep tendon reflexes are symmetric and normal bilaterally.   DIAGNOSTIC DATA (LABS, IMAGING, TESTING) - I reviewed patient records, labs, notes, testing and imaging myself where available.  Lab Results  Component Value Date   WBC 10.7 (H) 01/19/2023   HGB 14.2 01/19/2023   HCT 44.8 01/19/2023   MCV 87.8 01/19/2023   PLT 314.0 01/19/2023      Component Value Date/Time   NA 141 01/19/2023 1051   NA 143 02/09/2020 1207   K 4.0 01/19/2023 1051   CL 103 01/19/2023 1051   CO2 28 01/19/2023 1051   GLUCOSE 88 01/19/2023 1051   BUN 12 01/19/2023 1051   BUN 12 02/09/2020 1207   CREATININE 0.70 01/19/2023 1051   CALCIUM 9.2 01/19/2023 1051   PROT 7.1 01/19/2023 1051   PROT 7.0 02/09/2020 1207   ALBUMIN 4.3 01/19/2023 1051   ALBUMIN 4.5  02/09/2020 1207   AST 14 01/19/2023 1051   ALT 13  01/19/2023 1051   ALKPHOS 99 01/19/2023 1051   BILITOT 0.3 01/19/2023 1051   BILITOT 0.2 02/09/2020 1207   GFRNONAA 81 02/09/2020 1207   GFRAA 94 02/09/2020 1207   Lab Results  Component Value Date   CHOL 204 (H) 01/19/2023   HDL 58.30 01/19/2023   LDLCALC 131 (H) 01/19/2023   TRIG 73.0 01/19/2023   CHOLHDL 3 01/19/2023   Lab Results  Component Value Date   HGBA1C 5.9 01/19/2023   Lab Results  Component Value Date   VITAMINB12 260 05/15/2022   No results found for: "TSH"  Margie Ege, Edrick Oh, DNP 05/26/2023, 9:14 PM [facility] [address] [place], [place] [address] [phone]

## 2023-05-27 ENCOUNTER — Encounter: Payer: Self-pay | Admitting: Neurology

## 2023-05-27 ENCOUNTER — Ambulatory Visit (INDEPENDENT_AMBULATORY_CARE_PROVIDER_SITE_OTHER): Admitting: Neurology

## 2023-05-27 VITALS — BP 136/75 | HR 96 | Ht 60.0 in | Wt 168.0 lb

## 2023-05-27 DIAGNOSIS — G4733 Obstructive sleep apnea (adult) (pediatric): Secondary | ICD-10-CM

## 2023-05-27 NOTE — Progress Notes (Signed)
 Patient: Dana Daniels Date of Birth: 11-05-1945  Reason for Visit: Follow up for OSA History from: Patient, interpreter Primary Neurologist: Athar  ASSESSMENT AND PLAN 78 y.o. year old female with severe OSA as demonstrated by the recent HST, and CPAP therapy is providing therapeutic benefit. The patient reports good adherence with CPAP usage, as shown by the download data, and the current AHI of 1.0 indicates effective therapy.The patient has reported no issues with mask fit, dryness, or discomfort while using the CPAP.  - No changes are necessary at this time, as the patient's usage data and subjective report indicate that the CPAP therapy is effective. Continue using the CPAP every night for optimal therapeutic benefit.  Recommend nightly use for minimum 4 hours. - Reach out if experiencing any new issues, including dry mouth, dry eyes, or increased daytime sleepiness. - Follow up in 1 year or sooner if enountering any difficulties or concerns with CPAP use.   HISTORY OF PRESENT ILLNESS: Today 05/27/23 The patient is a 78 year old Female with a history of obstructive sleep apnea, who was seen by Dr. Frances Furbish in September 2024. During that visit, a home sleep test (HST) was ordered to qualify for a CPAP machine. The HST, conducted on 01/07/2023, showed severe OSA with an apnea-hypopnea index of 37/hour, an oxygen saturation nadir of 76%, and significant time below 88% for over 10 minutes, indicating nocturnal hypoxemia. Additionally, a mild central apnea was noted. At today's visit, an interpreter assisted with communication, and the patient expressed understanding of the need for CPAP therapy and we reviewed her results together. We spoke about the significance of the results and the patientt expressed understanding. She reports that she feels she is benefitting from the CPAP and is sleeping better, though her sleep duration remains between 4-7 hours per night (used to work night shift).  She has not experienced any issues with the CPAP machine; the mask is comfortable, and she has a good seal. The patient denies dry mouth, dry eyes, daytime fatigue, or sleepiness. She states she notices a difference in her restfulness during the day, feeling more rested and alert. If she experiences any issues such as dry mouth, dry eyes, or increased daytime sleepiness, she plans to reach out for assistance. CPAP download Days of use: 24/30, Average usage per night: 4 hours, Leak rate: 5.6 (Patient does not notice leak; sleeps on side and occasionally rolls over), AHI: 1.0.  Saw Dr. Frances Furbish September 2024 with history of CPAP usage for OSA.  An HST was ordered to qualify for a new CPAP machine.  HST 01/07/2023 showed severe OSA with total AHI 37/hour and O2 nadir of 76% with significant time below at 88% saturation of over 10 minutes for the study indicating nocturnal hypoxemia.  There was a mild central apnea as well.  HISTORY  11/11/22 Dr. Frances Furbish: I saw your patient, Dana Daniels, upon your kind request in my neurologic clinic today for initial consultation of her sleep disorder, in particular, evaluation of her prior diagnosis of obstructive sleep apnea.  The patient is unaccompanied today.  We utilized the remote video interpreter for this appointment, Spanish interpreter was [place]. As you know, [name] is a 78 year old female with an underlying medical history of allergies, arthritis, asthma, hypertension, hyperlipidemia, migraine headaches, osteoporosis, and mild obesity, who was previously diagnosed with obstructive sleep apnea and placed on PAP therapy.  She has an Epworth sleepiness score of 1/24, fatigue severity score is 14 out of 63.  She reports that she does not sleep well.  She has benefited from PAP therapy but has not used it in the past several months.  I was able to review her AutoPap compliance data for the past year, she used her machine reasonably consistently up until  March of this year, very sporadically through June of this year but none since July of this year.  She has not had regular supplies, she uses nasal pillows, she actually benefited from treatment when she first started AutoPap therapy some 5 years ago.  She does not currently have a DME company.  It looks like she got her machine from [facility].  She does not have a sleep specialist currently.  She does not sleep well and has chronic difficulty initiating and maintaining sleep, essentially since her husband passed away 29 years ago.  He had multiple strokes and was in a coma before he passed.  She has 3 grown children, 1 daughter and 2 sons, youngest son is 17.  She is retired since 2009.  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  She is a non-smoker and does not drink alcohol.  She drinks quite a bit of caffeine in the form of black tea, 3 to 4 cups/day and at night she will drink coffee with milk.  Bedtime is 1 or 2 AM and rise time somewhere between 6 and 7 AM.  She does not have nightly nocturia.  She does have occasional morning headaches.  Over the course of 20 years she had weight gain in the realm of 40 pounds.  She has been working on weight loss and has lost about 10 pounds in the past month and a half.  She had a tonsillectomy as a child. She had a home sleep test on 09/07/2017, study was interpreted by Dr. Cyril Mourning.  Her AHI was 12.1/h, average oxygen saturation 93%, nadir was 82%.   She takes Ambien as needed, 5 mg strength, on average maybe once a week.  REVIEW OF SYSTEMS: Out of a complete 14 system review of symptoms, the patient complains only of the following symptoms, and all other reviewed systems are negative.  See HPI  ALLERGIES: Allergies  Allergen Reactions   Tramadol Itching    HOME MEDICATIONS: Outpatient Medications Prior to Visit  Medication Sig Dispense Refill   albuterol (VENTOLIN HFA) 108 (90 Base) MCG/ACT inhaler Inhale 1-2 puffs into the lungs every 4 (four)  hours as needed for wheezing or shortness of breath. 8 g 3   CARTIA XT 240 MG 24 hr capsule Take 1 capsule by mouth once daily 90 capsule 0   cetirizine (ZYRTEC) 10 MG tablet Take 1 tablet (10 mg total) by mouth daily. 10 tablet 11   cromolyn (NASALCROM) 5.2 MG/ACT nasal spray Place 1 spray into both nostrils 2 (two) times daily. 26 mL 12   Fluticasone-Umeclidin-Vilant (TRELEGY ELLIPTA) 100-62.5-25 MCG/ACT AEPB Inhale 1 puff by mouth once daily 60 each 3   montelukast (SINGULAIR) 10 MG tablet Take 1 tablet (10 mg total) by mouth at bedtime. 90 tablet 3   nitroGLYCERIN (NITROSTAT) 0.4 MG SL tablet DISSOLVE ONE TABLET UNDER THE TONGUE EVERY 5 MINUTES AS NEEDED FOR CHEST PAIN.  DO NOT EXCEED A TOTAL OF 3 DOSES IN 15 MINUTES 25 tablet 0   omeprazole (PRILOSEC) 40 MG capsule Take 1 capsule (40 mg total) by mouth daily. 90 capsule 3   simvastatin (ZOCOR) 20 MG tablet Take 1 tablet (20 mg total) by mouth daily. 90  tablet 3   Ubrogepant (UBRELVY) 100 MG TABS Take 1 tablet (100 mg total) by mouth daily as needed. Take at onset of migraine headache 15 tablet 3   zolpidem (AMBIEN) 5 MG tablet Take 1 tablet (5 mg total) by mouth at bedtime as needed for sleep. 15 tablet 1   azithromycin (ZITHROMAX) 250 MG tablet Sig as indicated 6 tablet 0   No facility-administered medications prior to visit.    PAST MEDICAL HISTORY: Past Medical History:  Diagnosis Date   Allergy    Arthritis    Asthma    Blood transfusion without reported diagnosis    Cataract    Hyperlipidemia    Hypertension    Migraine    Osteoporosis    Sleep apnea     PAST SURGICAL HISTORY: Past Surgical History:  Procedure Laterality Date   ABDOMINAL HYSTERECTOMY     CHOLECYSTECTOMY     EYE SURGERY Left    cataract    FOOT SURGERY Left     FAMILY HISTORY: Family History  Problem Relation Age of Onset   Heart disease Mother    Hyperlipidemia Mother    Hypertension Mother    Stroke Mother    Heart disease Father    Stroke  Father    Diabetes Daughter    Hypertension Daughter    Hypertension Son    Hyperlipidemia Son    Fibromyalgia Son    Lupus Son    Sleep apnea Son    Hypertension Son    Sleep apnea Son     SOCIAL HISTORY: Social History   Socioeconomic History   Marital status: Widowed    Spouse name: Not on file   Number of children: Not on file   Years of education: Not on file   Highest education level: Associate degree: occupational, Scientist, product/process development, or vocational program  Occupational History   Not on file  Tobacco Use   Smoking status: Never   Smokeless tobacco: Never  Vaping Use   Vaping status: Never Used  Substance and Sexual Activity   Alcohol use: No   Drug use: Never   Sexual activity: Not on file  Other Topics Concern   Not on file  Social History Narrative   Not on file   Social Drivers of Health   Financial Resource Strain: Patient Declined (05/26/2023)   Overall Financial Resource Strain (CARDIA)    Difficulty of Paying Living Expenses: Patient declined  Food Insecurity: Patient Declined (05/26/2023)   Hunger Vital Sign    Worried About Running Out of Food in the Last Year: Patient declined    Ran Out of Food in the Last Year: Patient declined  Transportation Needs: Patient Declined (05/26/2023)   PRAPARE - Administrator, Civil Service (Medical): Patient declined    Lack of Transportation (Non-Medical): Patient declined  Physical Activity: Insufficiently Active (05/26/2023)   Exercise Vital Sign    Days of Exercise per Week: 3 days    Minutes of Exercise per Session: 20 min  Stress: No Stress Concern Present (05/26/2023)   [employer] of Occupational Health - Occupational Stress Questionnaire    Feeling of Stress : Not at all  Social Connections: Unknown (05/26/2023)   Social Connection and Isolation Panel [NHANES]    Frequency of Communication with Friends and Family: More than three times a week    Frequency of Social Gatherings with Friends and  Family: Twice a week    Attends Religious Services: Patient declined    Active  Member of Clubs or Organizations: Patient declined    Attends Banker Meetings: Patient declined    Marital Status: Widowed  Intimate Partner Violence: Not At Risk (11/05/2022)   Humiliation, Afraid, Rape, and Kick questionnaire    Fear of Current or Ex-Partner: No    Emotionally Abused: No    Physically Abused: No    Sexually Abused: No   PHYSICAL EXAM  Vitals:   05/27/23 0917  BP: 136/75  Pulse: 96  Weight: 167 lb 15.9 oz (76.2 kg)  Height: 5' (1.524 m)   Body mass index is 32.81 kg/m.  Generalized: Well developed, in no acute distress  Neurological examination  Mentation: Alert oriented to time, place, history taking. Follows all commands speech and language fluent Cranial nerve II-XII: Pupils were equal round reactive to light. Extraocular movements were full, visual field were full on confrontational test. Facial sensation and strength were normal. Shoulder shrug  was normal and symmetric. Motor: The motor testing reveals 5 over 5 strength of all 4 extremities. Good symmetric motor tone is noted throughout.  Sensory: Sensory testing is intact to soft touch on all 4 extremities. No evidence of extinction is noted.  Gait and station: Gait is normal.   DIAGNOSTIC DATA (LABS, IMAGING, TESTING) - I reviewed patient records, labs, notes, testing and imaging myself where available.  Lab Results  Component Value Date   WBC 10.7 (H) 01/19/2023   HGB 14.2 01/19/2023   HCT 44.8 01/19/2023   MCV 87.8 01/19/2023   PLT 314.0 01/19/2023      Component Value Date/Time   NA 141 01/19/2023 1051   NA 143 02/09/2020 1207   K 4.0 01/19/2023 1051   CL 103 01/19/2023 1051   CO2 28 01/19/2023 1051   GLUCOSE 88 01/19/2023 1051   BUN 12 01/19/2023 1051   BUN 12 02/09/2020 1207   CREATININE 0.70 01/19/2023 1051   CALCIUM 9.2 01/19/2023 1051   PROT 7.1 01/19/2023 1051   PROT 7.0 02/09/2020 1207    ALBUMIN 4.3 01/19/2023 1051   ALBUMIN 4.5 02/09/2020 1207   AST 14 01/19/2023 1051   ALT 13 01/19/2023 1051   ALKPHOS 99 01/19/2023 1051   BILITOT 0.3 01/19/2023 1051   BILITOT 0.2 02/09/2020 1207   GFRNONAA 81 02/09/2020 1207   GFRAA 94 02/09/2020 1207   Lab Results  Component Value Date   CHOL 204 (H) 01/19/2023   HDL 58.30 01/19/2023   LDLCALC 131 (H) 01/19/2023   TRIG 73.0 01/19/2023   CHOLHDL 3 01/19/2023   Lab Results  Component Value Date   HGBA1C 5.9 01/19/2023   Lab Results  Component Value Date   VITAMINB12 260 05/15/2022   No results found for: "TSH"  Margie Ege, AGNP-C, DNP 05/27/2023, 10:04 AM [facility] [address], [address] [place], [place] [address] [phone]

## 2023-05-27 NOTE — Patient Instructions (Addendum)
 Continue CPAP use nightly for minimum 4 hours.  We will continue current settings.  Please reach out for any CPAP issues.  Please work with DME to request refills on CPAP supply.  Follow-up in 1 year.  Thanks!!

## 2023-05-28 ENCOUNTER — Encounter: Payer: Self-pay | Admitting: Emergency Medicine

## 2023-05-28 ENCOUNTER — Ambulatory Visit (INDEPENDENT_AMBULATORY_CARE_PROVIDER_SITE_OTHER): Admitting: Emergency Medicine

## 2023-05-28 VITALS — BP 144/76 | HR 76 | Temp 98.1°F | Ht 60.0 in | Wt 168.0 lb

## 2023-05-28 DIAGNOSIS — G4733 Obstructive sleep apnea (adult) (pediatric): Secondary | ICD-10-CM | POA: Diagnosis not present

## 2023-05-28 DIAGNOSIS — I10 Essential (primary) hypertension: Secondary | ICD-10-CM | POA: Diagnosis not present

## 2023-05-28 DIAGNOSIS — Z8669 Personal history of other diseases of the nervous system and sense organs: Secondary | ICD-10-CM

## 2023-05-28 DIAGNOSIS — Z1211 Encounter for screening for malignant neoplasm of colon: Secondary | ICD-10-CM | POA: Diagnosis not present

## 2023-05-28 DIAGNOSIS — E785 Hyperlipidemia, unspecified: Secondary | ICD-10-CM | POA: Diagnosis not present

## 2023-05-28 DIAGNOSIS — J449 Chronic obstructive pulmonary disease, unspecified: Secondary | ICD-10-CM

## 2023-05-28 DIAGNOSIS — M159 Polyosteoarthritis, unspecified: Secondary | ICD-10-CM

## 2023-05-28 DIAGNOSIS — F5104 Psychophysiologic insomnia: Secondary | ICD-10-CM

## 2023-05-28 DIAGNOSIS — G43709 Chronic migraine without aura, not intractable, without status migrainosus: Secondary | ICD-10-CM | POA: Diagnosis not present

## 2023-05-28 DIAGNOSIS — J302 Other seasonal allergic rhinitis: Secondary | ICD-10-CM | POA: Diagnosis not present

## 2023-05-28 DIAGNOSIS — Z8719 Personal history of other diseases of the digestive system: Secondary | ICD-10-CM

## 2023-05-28 LAB — CBC WITH DIFFERENTIAL/PLATELET
Basophils Absolute: 0 10*3/uL (ref 0.0–0.1)
Basophils Relative: 0.3 % (ref 0.0–3.0)
Eosinophils Absolute: 0.1 10*3/uL (ref 0.0–0.7)
Eosinophils Relative: 1.5 % (ref 0.0–5.0)
HCT: 45.1 % (ref 36.0–46.0)
Hemoglobin: 14.5 g/dL (ref 12.0–15.0)
Lymphocytes Relative: 38 % (ref 12.0–46.0)
Lymphs Abs: 3.6 10*3/uL (ref 0.7–4.0)
MCHC: 32 g/dL (ref 30.0–36.0)
MCV: 86.4 fl (ref 78.0–100.0)
Monocytes Absolute: 0.6 10*3/uL (ref 0.1–1.0)
Monocytes Relative: 6.3 % (ref 3.0–12.0)
Neutro Abs: 5.1 10*3/uL (ref 1.4–7.7)
Neutrophils Relative %: 53.9 % (ref 43.0–77.0)
Platelets: 296 10*3/uL (ref 150.0–400.0)
RBC: 5.22 Mil/uL — ABNORMAL HIGH (ref 3.87–5.11)
RDW: 14.1 % (ref 11.5–15.5)
WBC: 9.4 10*3/uL (ref 4.0–10.5)

## 2023-05-28 LAB — LIPID PANEL
Cholesterol: 139 mg/dL (ref 0–200)
HDL: 45.6 mg/dL (ref >39.00)
LDL Cholesterol: 73 mg/dL (ref 0–99)
NonHDL: 93
Total CHOL/HDL Ratio: 3
Triglycerides: 100 mg/dL (ref 0.0–149.0)
VLDL: 20 mg/dL (ref 0.0–40.0)

## 2023-05-28 LAB — COMPREHENSIVE METABOLIC PANEL WITH GFR
ALT: 15 U/L (ref 0–35)
AST: 15 U/L (ref 0–37)
Albumin: 4.4 g/dL (ref 3.5–5.2)
Alkaline Phosphatase: 84 U/L (ref 39–117)
BUN: 9 mg/dL (ref 6–23)
CO2: 28 meq/L (ref 19–32)
Calcium: 9.2 mg/dL (ref 8.4–10.5)
Chloride: 104 meq/L (ref 96–112)
Creatinine, Ser: 0.67 mg/dL (ref 0.40–1.20)
GFR: 85.32 mL/min (ref >60.00)
Glucose, Bld: 100 mg/dL — ABNORMAL HIGH (ref 70–99)
Potassium: 3.7 meq/L (ref 3.5–5.1)
Sodium: 140 meq/L (ref 135–145)
Total Bilirubin: 0.4 mg/dL (ref 0.2–1.2)
Total Protein: 7.3 g/dL (ref 6.0–8.3)

## 2023-05-28 LAB — HEMOGLOBIN A1C: Hgb A1c MFr Bld: 5.8 % (ref 4.6–6.5)

## 2023-05-28 MED ORDER — CROMOLYN SODIUM 5.2 MG/ACT NA AERS: 1.0000 | INHALATION_SPRAY | Freq: Two times a day (BID) | NASAL | 12 refills | Status: AC

## 2023-05-28 MED ORDER — ALBUTEROL SULFATE HFA 108 (90 BASE) MCG/ACT IN AERS: 1.0000 | INHALATION_SPRAY | RESPIRATORY_TRACT | 3 refills | Status: DC | PRN

## 2023-05-28 MED ORDER — NITROGLYCERIN 0.4 MG SL SUBL: 0.4000 mg | SUBLINGUAL_TABLET | SUBLINGUAL | 0 refills | Status: AC | PRN

## 2023-05-28 MED ORDER — CETIRIZINE HCL 10 MG PO TABS: 10.0000 mg | ORAL_TABLET | Freq: Every day | ORAL | 11 refills | Status: AC

## 2023-05-28 MED ORDER — UBRELVY 100 MG PO TABS: 100.0000 mg | ORAL_TABLET | Freq: Every day | ORAL | 3 refills | Status: DC | PRN

## 2023-05-28 MED ORDER — ZOLPIDEM TARTRATE 5 MG PO TABS: 5.0000 mg | ORAL_TABLET | Freq: Every evening | ORAL | 1 refills | Status: AC | PRN

## 2023-05-28 MED ORDER — OMEPRAZOLE 40 MG PO CPDR: 40.0000 mg | DELAYED_RELEASE_CAPSULE | Freq: Every day | ORAL | 3 refills | Status: DC

## 2023-05-28 MED ORDER — DILTIAZEM HCL ER COATED BEADS 240 MG PO CP24: 240.0000 mg | ORAL_CAPSULE | Freq: Every day | ORAL | 0 refills | Status: DC

## 2023-05-28 MED ORDER — SIMVASTATIN 20 MG PO TABS: 20.0000 mg | ORAL_TABLET | Freq: Every day | ORAL | 3 refills | Status: AC

## 2023-05-28 MED ORDER — MONTELUKAST SODIUM 10 MG PO TABS: 10.0000 mg | ORAL_TABLET | Freq: Every day | ORAL | 3 refills | Status: DC

## 2023-05-28 MED ORDER — TRELEGY ELLIPTA 100-62.5-25 MCG/ACT IN AEPB: INHALATION_SPRAY | RESPIRATORY_TRACT | 3 refills | Status: DC

## 2023-05-28 NOTE — Assessment & Plan Note (Signed)
Stable.  Takes Zyrtec 10 mg as needed. Singulair 10 mg daily

## 2023-05-28 NOTE — Assessment & Plan Note (Signed)
 Clinically stable.  Dana Daniels helping to abort headaches. Refill sent today

## 2023-05-28 NOTE — Assessment & Plan Note (Signed)
 Clinically stable and well-controlled Continues omeprazole 40 mg as needed

## 2023-05-28 NOTE — Assessment & Plan Note (Signed)
 Clinically stable. Continues daily Trelegy with success No recent use of albuterol rescue inhaler

## 2023-05-28 NOTE — Assessment & Plan Note (Signed)
 Including both knees. Local injections and physical therapy helping  Follows up with orthopedist on a regular basis.

## 2023-05-28 NOTE — Progress Notes (Signed)
 Deanndra Kirley Colon 78 y.o.   Chief Complaint  Patient presents with   Medication Refill    Patient here for med refills. States she needs a refill on all her medications     HISTORY OF PRESENT ILLNESS: This is a 78 y.o. female here for follow-up of multiple chronic medical conditions and medication refills. Overall doing well.  Has no complaints or medical concerns today.  Medication Refill Pertinent negatives include no abdominal pain, chest pain, chills, congestion, coughing, fever, nausea, rash, sore throat or vomiting.     Prior to Admission medications   Medication Sig Start Date End Date Taking? Authorizing Provider  albuterol (VENTOLIN HFA) 108 (90 Base) MCG/ACT inhaler Inhale 1-2 puffs into the lungs every 4 (four) hours as needed for wheezing or shortness of breath. 05/28/23   Georgina Quint, MD  cetirizine (ZYRTEC) 10 MG tablet Take 1 tablet (10 mg total) by mouth daily. 05/28/23 09/25/23  Georgina Quint, MD  cromolyn (NASALCROM) 5.2 MG/ACT nasal spray Place 1 spray into both nostrils 2 (two) times daily. 05/28/23   Georgina Quint, MD  diltiazem (CARTIA XT) 240 MG 24 hr capsule Take 1 capsule (240 mg total) by mouth daily. 05/28/23   Georgina Quint, MD  Fluticasone-Umeclidin-Vilant [facility]) 100-62.5-25 MCG/ACT AEPB Inhale 1 puff by mouth once daily 05/28/23   Georgina Quint, MD  montelukast (SINGULAIR) 10 MG tablet Take 1 tablet (10 mg total) by mouth at bedtime. 05/28/23 11/24/23  Georgina Quint, MD  nitroGLYCERIN (NITROSTAT) 0.4 MG SL tablet Place 1 tablet (0.4 mg total) under the tongue every 5 (five) minutes as needed for chest pain. 05/28/23   Georgina Quint, MD  omeprazole (PRILOSEC) 40 MG capsule Take 1 capsule (40 mg total) by mouth daily. 05/28/23 05/22/24  Georgina Quint, MD  simvastatin (ZOCOR) 20 MG tablet Take 1 tablet (20 mg total) by mouth daily. 05/28/23 05/22/24  Georgina Quint, MD  Ubrogepant  (UBRELVY) 100 MG TABS Take 1 tablet (100 mg total) by mouth daily as needed. Take at onset of migraine headache 05/28/23   Georgina Quint, MD  zolpidem (AMBIEN) 5 MG tablet Take 1 tablet (5 mg total) by mouth at bedtime as needed for sleep. 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   Seasonal allergies 03/31/2018   History of gastroesophageal reflux (GERD) 09/25/2017   Osteoarthritis of multiple joints 03/24/2017   Chronic pain of left knee 03/24/2017   Chronic obstructive pulmonary disease (HCC) 03/24/2017   Primary cancer of skin of left foot 02/03/2017   Mild intermittent asthma without complication 02/03/2017   Obstructive sleep apnea 02/03/2017   Dyslipidemia 02/03/2017   Migraine 02/03/2017   Essential hypertension 09/18/2008    Past Medical History:  Diagnosis Date   Allergy    Arthritis    Asthma    Blood transfusion without reported diagnosis    Cataract    Hyperlipidemia    Hypertension    Migraine    Osteoporosis    Sleep apnea     Past Surgical History:  Procedure Laterality Date   ABDOMINAL HYSTERECTOMY     CHOLECYSTECTOMY     EYE SURGERY Left  cataract    FOOT SURGERY Left     Social History   Socioeconomic History   Marital status: Widowed    Spouse name: Not on file   Number of children: Not on file   Years of education: Not on file   Highest education level: Associate degree: occupational, Scientist, product/process development, or vocational program  Occupational History   Not on file  Tobacco Use   Smoking status: Never   Smokeless tobacco: Never  Vaping Use   Vaping status: Never Used  Substance and Sexual Activity   Alcohol use: No   Drug use: Never   Sexual activity: Not on file   Other Topics Concern   Not on file  Social History Narrative   Not on file   Social Drivers of Health   Financial Resource Strain: Patient Declined (05/26/2023)   Overall Financial Resource Strain (CARDIA)    Difficulty of Paying Living Expenses: Patient declined  Food Insecurity: Patient Declined (05/26/2023)   Hunger Vital Sign    Worried About Running Out of Food in the Last Year: Patient declined    Ran Out of Food in the Last Year: Patient declined  Transportation Needs: Patient Declined (05/26/2023)   PRAPARE - Administrator, Civil Service (Medical): Patient declined    Lack of Transportation (Non-Medical): Patient declined  Physical Activity: Insufficiently Active (05/26/2023)   Exercise Vital Sign    Days of Exercise per Week: 3 days    Minutes of Exercise per Session: 20 min  Stress: No Stress Concern Present (05/26/2023)   [employer] of Occupational Health - Occupational Stress Questionnaire    Feeling of Stress : Not at all  Social Connections: Unknown (05/26/2023)   Social Connection and Isolation Panel [NHANES]    Frequency of Communication with Friends and Family: More than three times a week    Frequency of Social Gatherings with Friends and Family: Twice a week    Attends Religious Services: Patient declined    Database administrator or Organizations: Patient declined    Attends Banker Meetings: Patient declined    Marital Status: Widowed  Intimate Partner Violence: Not At Risk (11/05/2022)   Humiliation, Afraid, Rape, and Kick questionnaire    Fear of Current or Ex-Partner: No    Emotionally Abused: No    Physically Abused: No    Sexually Abused: No    Family History  Problem Relation Age of Onset   Heart disease Mother    Hyperlipidemia Mother    Hypertension Mother    Stroke Mother    Heart disease Father    Stroke Father    Diabetes Daughter    Hypertension Daughter    Hypertension Son    Hyperlipidemia Son     Fibromyalgia Son    Lupus Son    Sleep apnea Son    Hypertension Son    Sleep apnea Son      Review of Systems  Constitutional: Negative.  Negative for chills and fever.  HENT: Negative.  Negative for congestion and sore throat.   Respiratory: Negative.  Negative for cough and shortness of breath.   Cardiovascular: Negative.  Negative for chest pain and palpitations.  Gastrointestinal:  Negative for abdominal pain, diarrhea, nausea and vomiting.  Genitourinary:  Negative for dysuria and hematuria.  Musculoskeletal:  Positive for joint pain.  Skin: Negative.  Negative for rash.  Endo/Heme/Allergies:  Positive for environmental allergies.  All other systems reviewed and are negative.   Vitals:  05/28/23 0935  BP: (!) 144/76  Pulse: 76  Temp: 98.1 F (36.7 C)  SpO2: 97%    Physical Exam Vitals reviewed.  Constitutional:      Appearance: Normal appearance.  HENT:     Head: Normocephalic.     Mouth/Throat:     Mouth: Mucous membranes are moist.     Pharynx: Oropharynx is clear.  Eyes:     Extraocular Movements: Extraocular movements intact.     Conjunctiva/sclera: Conjunctivae normal.     Pupils: Pupils are equal, round, and reactive to light.  Cardiovascular:     Rate and Rhythm: Normal rate and regular rhythm.     Pulses: Normal pulses.     Heart sounds: Normal heart sounds.  Pulmonary:     Effort: Pulmonary effort is normal.     Breath sounds: Normal breath sounds.  Abdominal:     Palpations: Abdomen is soft.     Tenderness: There is no abdominal tenderness.  Musculoskeletal:     Cervical back: No tenderness.  Lymphadenopathy:     Cervical: No cervical adenopathy.  Skin:    General: Skin is warm and dry.     Capillary Refill: Capillary refill takes less than 2 seconds.  Neurological:     General: No focal deficit present.     Mental Status: She is alert and oriented to person, place, and time.  Psychiatric:        Mood and Affect: Mood normal.         Behavior: Behavior normal.      ASSESSMENT & PLAN: A total of 47 minutes was spent with the patient and counseling/coordination of care regarding preparing for this visit, review of most recent office visit notes, review of multiple chronic medical conditions and their management, cardiovascular risks associated with hypertension and dyslipidemia, review of all medications, review of most recent bloodwork results, review of health maintenance items, education on nutrition, prognosis, documentation, and need for follow up.   Problem List Items Addressed This Visit       Cardiovascular and Mediastinum   Essential hypertension - Primary   BP Readings from Last 3 Encounters:  05/28/23 (!) 144/76  05/27/23 136/75  05/15/23 138/70  Well-controlled hypertension Continues cartia XT 240 mg daily Cardiovascular risks associated with hypertension discussed Dietary approaches to stop hypertension discussed      Relevant Medications   nitroGLYCERIN (NITROSTAT) 0.4 MG SL tablet   simvastatin (ZOCOR) 20 MG tablet   diltiazem (CARTIA XT) 240 MG 24 hr capsule   Other Relevant Orders   Comprehensive metabolic panel with GFR   Hemoglobin A1c   Migraine   Clinically stable.  Bernita Raisin helping to abort headaches. Refill sent today      Relevant Medications   nitroGLYCERIN (NITROSTAT) 0.4 MG SL tablet   simvastatin (ZOCOR) 20 MG tablet   Ubrogepant (UBRELVY) 100 MG TABS   diltiazem (CARTIA XT) 240 MG 24 hr capsule     Respiratory   Obstructive sleep apnea   Recently diagnosed. Recommended CPAP treatment.       Chronic obstructive pulmonary disease (HCC)   Clinically stable. Continues daily Trelegy with success No recent use of albuterol rescue inhaler      Relevant Medications   albuterol (VENTOLIN HFA) 108 (90 Base) MCG/ACT inhaler   cromolyn (NASALCROM) 5.2 MG/ACT nasal spray   Fluticasone-Umeclidin-Vilant (TRELEGY ELLIPTA) 100-62.5-25 MCG/ACT AEPB   montelukast (SINGULAIR) 10 MG  tablet   cetirizine (ZYRTEC) 10 MG tablet   Other Relevant Orders  CBC with Differential/Platelet     Musculoskeletal and Integument   Osteoarthritis of multiple joints   Including both knees. Local injections and physical therapy helping  Follows up with orthopedist on a regular basis.        Other   Dyslipidemia   Chronic stable condition Continue simvastatin 20 mg daily Diet and nutrition discussed      Relevant Medications   simvastatin (ZOCOR) 20 MG tablet   Other Relevant Orders   Hemoglobin A1c   Lipid panel   History of gastroesophageal reflux (GERD)   Clinically stable and well-controlled Continues omeprazole 40 mg as needed      Relevant Medications   omeprazole (PRILOSEC) 40 MG capsule   Seasonal allergies   Stable.  Takes Zyrtec 10 mg as needed. Singulair 10 mg daily      Relevant Medications   cromolyn (NASALCROM) 5.2 MG/ACT nasal spray   montelukast (SINGULAIR) 10 MG tablet   cetirizine (ZYRTEC) 10 MG tablet   Chronic insomnia   Ambien 5 mg at bedtime helping      Relevant Medications   zolpidem (AMBIEN) 5 MG tablet   Other Visit Diagnoses       History of migraine       Relevant Medications   Ubrogepant (UBRELVY) 100 MG TABS     Screening for colon cancer       Relevant Orders   Cologuard          Patient Instructions  Mantenimiento de la salud despus de los 65 aos de edad Health Maintenance After Age 56 Despus de los 65 aos de edad, corre un riesgo mayor de Film/video editor enfermedades e infecciones a Air cabin crew, como tambin de sufrir lesiones por cadas. Las cadas son la causa principal de las fracturas de huesos y lesiones en la cabeza de personas mayores de 65 aos de edad. Recibir cuidados preventivos de forma regular puede ayudarlo a mantenerse saludable y en buen [place]. Los cuidados preventivos incluyen realizarse anlisis de forma regular y Forensic psychologist en el estilo de vida segn las recomendaciones del mdico.  Converse con el mdico sobre lo siguiente: Las pruebas de deteccin y los anlisis que debe International aid/development worker. Una prueba de deteccin es un estudio que se para Engineer, manufacturing la presencia de una enfermedad cuando no tiene sntomas. Un plan de dieta y ejercicios adecuado para usted. Qu debo saber sobre las pruebas de deteccin y los anlisis para prevenir cadas? Realizarse pruebas de deteccin y [employer] es la mejor manera de Engineer, manufacturing un problema de salud de forma temprana. El diagnstico y tratamiento tempranos le brindan la mejor oportunidad de Chief Operating Officer las afecciones mdicas que son comunes despus de los 65 aos de edad. Ciertas afecciones y elecciones de estilo de vida pueden hacer que sea ms propenso a sufrir Engineer, site. El mdico puede recomendarle lo siguiente: Controles regulares de la visin. Una visin deficiente y afecciones como las cataratas pueden hacer que sea ms propenso a sufrir Engineer, site. Si [place] lentes, asegrese de obtener una receta actualizada si su visin cambia. Revisin de medicamentos. Revise regularmente con el mdico todos los medicamentos que toma, incluidos los medicamentos de [place]. Consulte al [employer] efectos secundarios que pueden hacer que sea ms propenso a sufrir Engineer, site. Informe al mdico si alguno de los medicamentos que toma lo hace sentir mareado o somnoliento. Controles de fuerza y equilibrio. El mdico puede recomendar ciertos estudios para controlar su fuerza y equilibrio al estar de pie, al caminar o  al cambiar de posicin. Examen de los pies. El dolor y Materials engineer en los pies, como tambin no utilizar el calzado [place], pueden hacer que sea ms propenso a sufrir Engineer, site. Pruebas de deteccin, que incluyen las siguientes: Pruebas de deteccin para la osteoporosis. La osteoporosis es una afeccin que hace que los huesos se tornen ms dbiles y se quiebren con ms facilidad. Pruebas de deteccin para la presin arterial. Los cambios en la  presin arterial y los medicamentos para Chief Operating Officer la presin arterial pueden hacerlo sentir mareado. Prueba de deteccin de la depresin. Es ms probable que sufra una cada si tiene miedo a caerse, se siente deprimido o se siente incapaz de Probation officer. Prueba de deteccin de consumo de alcohol. Beber demasiado alcohol puede afectar su equilibrio y puede hacer que sea ms propenso a sufrir Engineer, site. Siga estas indicaciones en su casa: Estilo de vida No beba alcohol si: Su mdico le indica no hacerlo. Si bebe alcohol: Limite la cantidad que bebe a lo siguiente: De 0 a 1 medida por da para las mujeres. De 0 a 2 medidas por da para los hombres. Sepa cunta cantidad de alcohol hay en las bebidas que toma. En los [address], una medida equivale a una botella de cerveza de 12 oz (355 ml), un vaso de vino de 5 oz (148 ml) o un vaso de una bebida alcohlica de alta graduacin de 1 oz (44 ml). No consuma ningn producto que contenga nicotina o tabaco. Estos productos incluyen cigarrillos, tabaco para Theatre manager y aparatos de vapeo, como los Administrator, Civil Service. Si necesita ayuda para dejar de consumir estos productos, consulte al [employer]. Actividad  Siga un programa de ejercicio regular para mantenerse en forma. Esto lo ayudar a Radio producer equilibrio. Consulte al mdico qu tipos de ejercicios son adecuados para usted. Si necesita un bastn o un andador, selo segn las recomendaciones del mdico. Utilice calzado con buen apoyo y suela antideslizante. Seguridad  Retire los [employer] puedan causar tropiezos tales como alfombras, cables u obstculos. Instale equipos de seguridad, como barras para sostn en los baos y barandas de seguridad en las escaleras. Mantenga las habitaciones y los pasillos bien iluminados. Indicaciones generales Hable con el mdico sobre sus riesgos de sufrir una cada. Infrmele a su mdico si: Se cae. Asegrese de informarle a su mdico  acerca de todas las cadas, incluso aquellas que parecen ser [employer]. Se siente mareado, cansado (tiene fatiga) o siente que pierde el equilibrio. Use los medicamentos de venta libre y los recetados solamente como se lo haya indicado el mdico. Estos incluyen suplementos. Siga una dieta sana y [place] un peso saludable. Una dieta saludable incluye productos lcteos descremados, carnes bajas en contenido de grasa ([place]), fibra de granos enteros, frijoles y [place] frutas y verduras. Mantngase al da con las vacunas. Realcese los estudios de rutina de la salud, dentales y de Wellsite geologist. Resumen Tener un estilo de vida saludable y recibir cuidados preventivos pueden ayudar a Research scientist (physical sciences) salud y el bienestar despus de los 65 aos de [place]. Realizarse pruebas de deteccin y [employer] es la mejor manera de Engineer, manufacturing un problema de salud de forma temprana y [place]ber [place] a Automotive engineer una cada. El diagnstico y tratamiento tempranos le brindan la mejor oportunidad de Chief Operating Officer las afecciones mdicas ms comunes en las personas mayores de 65 aos de edad. Las cadas son la causa principal de las fracturas de huesos y lesiones en la cabeza de Financial planner de 65 aos  de edad. Tome precauciones para evitar una cada en su casa. Trabaje con el mdico para saber qu cambios que puede hacer para mejorar su salud y [employer], y para prevenir las cadas. Esta informacin no tiene Theme park manager el consejo del mdico. Asegrese de hacerle al mdico cualquier pregunta que tenga. Document Revised: 07/25/2020 Document Reviewed: 07/25/2020 Elsevier Patient Education  2024 Elsevier Inc.    Edwina Barth, MD [facility]

## 2023-05-28 NOTE — Assessment & Plan Note (Addendum)
 BP Readings from Last 3 Encounters:  05/28/23 (!) 144/76  05/27/23 136/75  05/15/23 138/70  Well-controlled hypertension Continues cartia XT 240 mg daily Cardiovascular risks associated with hypertension discussed Dietary approaches to stop hypertension discussed

## 2023-05-28 NOTE — Assessment & Plan Note (Signed)
 Recently diagnosed.  Recommended CPAP treatment.

## 2023-05-28 NOTE — Patient Instructions (Signed)
 Mantenimiento de la salud despus de los 65 aos de edad Health Maintenance After Age 76 Despus de los 65 aos de edad, corre un riesgo mayor de Film/video editor enfermedades e infecciones a Air cabin crew, como tambin de sufrir lesiones por cadas. Las cadas son la causa principal de las fracturas de huesos y lesiones en la cabeza de personas mayores de 65 aos de edad. Recibir cuidados preventivos de forma regular puede ayudarlo a mantenerse saludable y en buen [place]. Los cuidados preventivos incluyen realizarse anlisis de forma regular y Forensic psychologist en el estilo de vida segn las recomendaciones del mdico. Converse con el mdico sobre lo siguiente: Las pruebas de deteccin y los anlisis que debe International aid/development worker. Una prueba de deteccin es un estudio que se para Engineer, manufacturing la presencia de una enfermedad cuando no tiene sntomas. Un plan de dieta y ejercicios adecuado para usted. Qu debo saber sobre las pruebas de deteccin y los anlisis para prevenir cadas? Realizarse pruebas de deteccin y [employer] es la mejor manera de Engineer, manufacturing un problema de salud de forma temprana. El diagnstico y tratamiento tempranos le brindan la mejor oportunidad de Chief Operating Officer las afecciones mdicas que son comunes despus de los 65 aos de edad. Ciertas afecciones y elecciones de estilo de vida pueden hacer que sea ms propenso a sufrir Engineer, site. El mdico puede recomendarle lo siguiente: Controles regulares de la visin. Una visin deficiente y afecciones como las cataratas pueden hacer que sea ms propenso a sufrir Engineer, site. Si [place] lentes, asegrese de obtener una receta actualizada si su visin cambia. Revisin de medicamentos. Revise regularmente con el mdico todos los medicamentos que toma, incluidos los medicamentos de [place]. Consulte al [employer] efectos secundarios que pueden hacer que sea ms propenso a sufrir Engineer, site. Informe al mdico si alguno de los medicamentos que toma lo hace sentir mareado o  somnoliento. Controles de fuerza y equilibrio. El mdico puede recomendar ciertos estudios para controlar su fuerza y equilibrio al estar de pie, al caminar o al cambiar de posicin. Examen de los pies. El dolor y Materials engineer en los pies, como tambin no utilizar el calzado [place], pueden hacer que sea ms propenso a sufrir Engineer, site. Pruebas de deteccin, que incluyen las siguientes: Pruebas de deteccin para la osteoporosis. La osteoporosis es una afeccin que hace que los huesos se tornen ms dbiles y se quiebren con ms facilidad. Pruebas de deteccin para la presin arterial. Los cambios en la presin arterial y los medicamentos para Chief Operating Officer la presin arterial pueden hacerlo sentir mareado. Prueba de deteccin de la depresin. Es ms probable que sufra una cada si tiene miedo a caerse, se siente deprimido o se siente incapaz de Probation officer. Prueba de deteccin de consumo de alcohol. Beber demasiado alcohol puede afectar su equilibrio y puede hacer que sea ms propenso a sufrir Engineer, site. Siga estas indicaciones en su casa: Estilo de vida No beba alcohol si: Su mdico le indica no hacerlo. Si bebe alcohol: Limite la cantidad que bebe a lo siguiente: De 0 a 1 medida por da para las mujeres. De 0 a 2 medidas por da para los hombres. Sepa cunta cantidad de alcohol hay en las bebidas que toma. En los [address], una medida equivale a una botella de cerveza de 12 oz (355 ml), un vaso de vino de 5 oz (148 ml) o un vaso de una bebida alcohlica de alta graduacin de 1 oz (44 ml). No consuma ningn producto que  contenga nicotina o tabaco. Estos productos incluyen cigarrillos, tabaco para Theatre manager y aparatos de vapeo, como los Administrator, Civil Service. Si necesita ayuda para dejar de consumir estos productos, consulte al [employer]. Actividad  Siga un programa de ejercicio regular para mantenerse en forma. Esto lo ayudar a Radio producer equilibrio. Consulte al  mdico qu tipos de ejercicios son adecuados para usted. Si necesita un bastn o un andador, selo segn las recomendaciones del mdico. Utilice calzado con buen apoyo y suela antideslizante. Seguridad  Retire los [employer] puedan causar tropiezos tales como alfombras, cables u obstculos. Instale equipos de seguridad, como barras para sostn en los baos y barandas de seguridad en las escaleras. Mantenga las habitaciones y los pasillos bien iluminados. Indicaciones generales Hable con el mdico sobre sus riesgos de sufrir una cada. Infrmele a su mdico si: Se cae. Asegrese de informarle a su mdico acerca de todas las cadas, incluso aquellas que parecen ser [employer]. Se siente mareado, cansado (tiene fatiga) o siente que pierde el equilibrio. Use los medicamentos de venta libre y los recetados solamente como se lo haya indicado el mdico. Estos incluyen suplementos. Siga una dieta sana y [place] un peso saludable. Una dieta saludable incluye productos lcteos descremados, carnes bajas en contenido de grasa ([place]), fibra de granos enteros, frijoles y [place] frutas y verduras. Mantngase al da con las vacunas. Realcese los estudios de rutina de la salud, dentales y de Wellsite geologist. Resumen Tener un estilo de vida saludable y recibir cuidados preventivos pueden ayudar a Research scientist (physical sciences) salud y el bienestar despus de los 65 aos de [place]. Realizarse pruebas de deteccin y [employer] es la mejor manera de Engineer, manufacturing un problema de salud de forma temprana y Eber [place] a Automotive engineer una cada. El diagnstico y tratamiento tempranos le brindan la mejor oportunidad de Chief Operating Officer las afecciones mdicas ms comunes en las personas mayores de 65 aos de edad. Las cadas son la causa principal de las fracturas de huesos y lesiones en la cabeza de personas mayores de 65 aos de edad. Tome precauciones para evitar una cada en su casa. Trabaje con el mdico para saber qu cambios que puede hacer para mejorar su salud y  [place], y para prevenir las cadas. Esta informacin no tiene Theme park manager el consejo del mdico. Asegrese de hacerle al mdico cualquier pregunta que tenga. Document Revised: 07/25/2020 Document Reviewed: 07/25/2020 Elsevier Patient Education  2024 [employer].

## 2023-05-28 NOTE — Assessment & Plan Note (Signed)
 Ambien 5 mg at bedtime helping

## 2023-05-28 NOTE — Assessment & Plan Note (Signed)
 Chronic stable condition Continue simvastatin 20 mg daily Diet and nutrition discussed

## 2023-06-08 DIAGNOSIS — G4733 Obstructive sleep apnea (adult) (pediatric): Secondary | ICD-10-CM | POA: Diagnosis not present

## 2023-06-17 ENCOUNTER — Ambulatory Visit
Admission: RE | Admit: 2023-06-17 | Discharge: 2023-06-17 | Disposition: A | Discharge status: Home / Self Care | Source: Ambulatory Visit | Attending: Family Medicine | Admitting: Family Medicine

## 2023-06-17 DIAGNOSIS — G8929 Other chronic pain: Secondary | ICD-10-CM

## 2023-06-17 DIAGNOSIS — M94262 Chondromalacia, left knee: Secondary | ICD-10-CM | POA: Diagnosis not present

## 2023-06-17 DIAGNOSIS — M23332 Other meniscus derangements, other medial meniscus, left knee: Secondary | ICD-10-CM | POA: Diagnosis not present

## 2023-06-17 DIAGNOSIS — M1712 Unilateral primary osteoarthritis, left knee: Secondary | ICD-10-CM

## 2023-06-18 DIAGNOSIS — G4733 Obstructive sleep apnea (adult) (pediatric): Secondary | ICD-10-CM | POA: Diagnosis not present

## 2023-06-23 DIAGNOSIS — G4733 Obstructive sleep apnea (adult) (pediatric): Secondary | ICD-10-CM | POA: Diagnosis not present

## 2023-06-26 ENCOUNTER — Encounter: Payer: Self-pay | Admitting: Family Medicine

## 2023-06-26 DIAGNOSIS — G8929 Other chronic pain: Secondary | ICD-10-CM

## 2023-06-26 NOTE — Progress Notes (Signed)
 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  I think it may be reasonable to consider surgery for this.  You potentially could have a meniscus surgery and not requiring knee replacement anytime soon.  Would you like me to place a referral?

## 2023-07-01 NOTE — Telephone Encounter (Signed)
 Referral placed to Ortho Surg.

## 2023-07-02 ENCOUNTER — Ambulatory Visit: Payer: 59 | Admitting: Neurology

## 2023-07-07 ENCOUNTER — Ambulatory Visit (INDEPENDENT_AMBULATORY_CARE_PROVIDER_SITE_OTHER): Admitting: Emergency Medicine

## 2023-07-07 ENCOUNTER — Encounter: Payer: Self-pay | Admitting: Emergency Medicine

## 2023-07-07 VITALS — BP 148/84 | HR 89 | Temp 99.3°F | Ht 60.0 in | Wt 168.0 lb

## 2023-07-07 DIAGNOSIS — R6889 Other general symptoms and signs: Secondary | ICD-10-CM | POA: Insufficient documentation

## 2023-07-07 DIAGNOSIS — R051 Acute cough: Secondary | ICD-10-CM

## 2023-07-07 DIAGNOSIS — J22 Unspecified acute lower respiratory infection: Secondary | ICD-10-CM

## 2023-07-07 MED ORDER — AZITHROMYCIN 250 MG PO TABS: ORAL_TABLET | ORAL | 0 refills | Status: AC

## 2023-07-07 MED ORDER — BENZONATATE 200 MG PO CAPS: 200.0000 mg | ORAL_CAPSULE | Freq: Two times a day (BID) | ORAL | 0 refills | Status: AC | PRN

## 2023-07-07 MED ORDER — HYDROCODONE BIT-HOMATROP MBR 5-1.5 MG/5ML PO SOLN: 5.0000 mL | Freq: Every evening | ORAL | 0 refills | Status: AC | PRN

## 2023-07-07 NOTE — Assessment & Plan Note (Signed)
 Symptom management discussed. Tylenol  as needed for headache and fever Advised to rest and stay well-hydrated Advised to contact the office if no better or worse during the next several days

## 2023-07-07 NOTE — Progress Notes (Signed)
 Dana Daniels 78 y.o.   Chief Complaint  Patient presents with   Cough    Patient states she has had this cough for 2 week, head aches, low grade fever, runny nose. She is only taking tylenol  right. She did a covid test last Tuesday and it was negative     HISTORY OF PRESENT ILLNESS: This is a 78 y.o. female complaining of persistent cough for the last 2 weeks Productive cough.  Denies difficulty breathing Complaining of congestion, headaches, low-grade fever, and runny nose Tested negative for COVID several days ago No other complaints or medical concerns today.  Cough Associated symptoms include a fever and headaches. Pertinent negatives include no chest pain, rash, sore throat or shortness of breath.     Prior to Admission medications   Medication Sig Start Date End Date Taking? Authorizing Provider  albuterol  (VENTOLIN  HFA) 108 (90 Base) MCG/ACT inhaler Inhale 1-2 puffs into the lungs every 4 (four) hours as needed for wheezing or shortness of breath. 05/28/23  Yes Kaylin Marcon, Isidro Margo, MD  azithromycin  (ZITHROMAX ) 250 MG tablet Sig as indicated 07/07/23  Yes Charron Coultas, Isidro Margo, MD  benzonatate  (TESSALON ) 200 MG capsule Take 1 capsule (200 mg total) by mouth 2 (two) times daily as needed for cough. 07/07/23  Yes Deshauna Cayson, Isidro Margo, MD  cetirizine  (ZYRTEC ) 10 MG tablet Take 1 tablet (10 mg total) by mouth daily. 05/28/23 09/25/23 Yes Shantara Goosby Jose, MD  cromolyn  (NASALCROM ) 5.2 MG/ACT nasal spray Place 1 spray into both nostrils 2 (two) times daily. 05/28/23  Yes Karina Lenderman, Isidro Margo, MD  diltiazem  (CARTIA  XT) 240 MG 24 hr capsule Take 1 capsule (240 mg total) by mouth daily. 05/28/23  Yes Pattricia Weiher Jose, MD  Fluticasone -Umeclidin-Vilant (TRELEGY ELLIPTA ) 100-62.5-25 MCG/ACT AEPB Inhale 1 puff by mouth once daily 05/28/23  Yes Shavaughn Seidl Jose, MD  HYDROcodone  bit-homatropine (HYCODAN) 5-1.5 MG/5ML syrup Take 5 mLs by mouth at bedtime as needed for cough.  07/07/23  Yes Shondra Capps, Isidro Margo, MD  montelukast  (SINGULAIR ) 10 MG tablet Take 1 tablet (10 mg total) by mouth at bedtime. 05/28/23 11/24/23 Yes Marsa Matteo, Isidro Margo, MD  nitroGLYCERIN  (NITROSTAT ) 0.4 MG SL tablet Place 1 tablet (0.4 mg total) under the tongue every 5 (five) minutes as needed for chest pain. 05/28/23  Yes Sydney Hasten, Isidro Margo, MD  omeprazole  (PRILOSEC) 40 MG capsule Take 1 capsule (40 mg total) by mouth daily. 05/28/23 05/22/24 Yes Ravan Schlemmer, Isidro Margo, MD  simvastatin  (ZOCOR ) 20 MG tablet Take 1 tablet (20 mg total) by mouth daily. 05/28/23 05/22/24 Yes Kanita Delage, Isidro Margo, MD  Ubrogepant  (UBRELVY ) 100 MG TABS Take 1 tablet (100 mg total) by mouth daily as needed. Take at onset of migraine headache 05/28/23  Yes Tait Balistreri, Isidro Margo, MD  zolpidem  (AMBIEN ) 5 MG tablet Take 1 tablet (5 mg total) by mouth at bedtime as needed for sleep. 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   Seasonal allergies 03/31/2018   History of gastroesophageal reflux (GERD) 09/25/2017   Osteoarthritis of multiple joints 03/24/2017   Chronic pain of left knee 03/24/2017   Chronic obstructive pulmonary disease (HCC) 03/24/2017  Primary cancer of skin of left foot 02/03/2017   Mild intermittent asthma without complication 02/03/2017   Obstructive sleep apnea 02/03/2017   Dyslipidemia 02/03/2017   Migraine 02/03/2017   Essential hypertension 09/18/2008   Acute cough 09/18/2008    Past Medical History:  Diagnosis Date   Allergy    Arthritis    Asthma    Blood transfusion without reported diagnosis    Cataract    Hyperlipidemia    Hypertension     Migraine    Osteoporosis    Sleep apnea     Past Surgical History:  Procedure Laterality Date   ABDOMINAL HYSTERECTOMY     CHOLECYSTECTOMY     EYE SURGERY Left    cataract    FOOT SURGERY Left     Social History   Socioeconomic History   Marital status: Widowed    Spouse name: Not on file   Number of children: Not on file   Years of education: Not on file   Highest education level: Associate degree: occupational, Scientist, product/process development, or vocational program  Occupational History   Not on file  Tobacco Use   Smoking status: Never   Smokeless tobacco: Never  Vaping Use   Vaping status: Never Used  Substance and Sexual Activity   Alcohol use: No   Drug use: Never   Sexual activity: Not on file  Other Topics Concern   Not on file  Social History Narrative   Not on file   Social Drivers of Health   Financial Resource Strain: Patient Declined (05/26/2023)   Overall Financial Resource Strain (CARDIA)    Difficulty of Paying Living Expenses: Patient declined  Food Insecurity: Patient Declined (05/26/2023)   Hunger Vital Sign    Worried About Running Out of Food in the Last Year: Patient declined    Ran Out of Food in the Last Year: Patient declined  Transportation Needs: Patient Declined (05/26/2023)   PRAPARE - Administrator, Civil Service (Medical): Patient declined    Lack of Transportation (Non-Medical): Patient declined  Physical Activity: Insufficiently Active (05/26/2023)   Exercise Vital Sign    Days of Exercise per Week: 3 days    Minutes of Exercise per Session: 20 min  Stress: No Stress Concern Present (05/26/2023)   [employer] of Occupational Health - Occupational Stress Questionnaire    Feeling of Stress : Not at all  Social Connections: Unknown (05/26/2023)   Social Connection and Isolation Panel [NHANES]    Frequency of Communication with Friends and Family: More than three times a week    Frequency of Social Gatherings with Friends and Family:  Twice a week    Attends Religious Services: Patient declined    Database administrator or Organizations: Patient declined    Attends Banker Meetings: Patient declined    Marital Status: Widowed  Intimate Partner Violence: Not At Risk (11/05/2022)   Humiliation, Afraid, Rape, and Kick questionnaire    Fear of Current or Ex-Partner: No    Emotionally Abused: No    Physically Abused: No    Sexually Abused: No    Family History  Problem Relation Age of Onset   Heart disease Mother    Hyperlipidemia Mother    Hypertension Mother    Stroke Mother    Heart disease Father    Stroke Father    Diabetes Daughter    Hypertension Daughter    Hypertension Son    Hyperlipidemia Son    Fibromyalgia Son  Lupus Son    Sleep apnea Son    Hypertension Son    Sleep apnea Son      Review of Systems  Constitutional:  Positive for fever.  HENT:  Positive for congestion. Negative for sore throat.   Respiratory:  Positive for cough and sputum production. Negative for shortness of breath.   Cardiovascular: Negative.  Negative for chest pain and palpitations.  Genitourinary: Negative.  Negative for dysuria and hematuria.  Skin:  Negative for rash.  Neurological:  Positive for headaches. Negative for dizziness.    Vitals:   07/07/23 1029  BP: (!) 148/84  Pulse: 89  Temp: 99.3 F (37.4 C)  SpO2: 97%    Physical Exam Vitals reviewed.  Constitutional:      Appearance: Normal appearance.  HENT:     Head: Normocephalic.     Mouth/Throat:     Mouth: Mucous membranes are moist.     Pharynx: Oropharynx is clear.  Eyes:     Extraocular Movements: Extraocular movements intact.     Pupils: Pupils are equal, round, and reactive to light.  Cardiovascular:     Rate and Rhythm: Normal rate and regular rhythm.     Pulses: Normal pulses.     Heart sounds: Normal heart sounds.  Pulmonary:     Effort: Pulmonary effort is normal.     Breath sounds: Normal breath sounds.   Musculoskeletal:     Cervical back: No tenderness.  Lymphadenopathy:     Cervical: No cervical adenopathy.  Skin:    General: Skin is warm and dry.  Neurological:     Mental Status: She is alert and oriented to person, place, and time.  Psychiatric:        Mood and Affect: Mood normal.        Behavior: Behavior normal.      ASSESSMENT & PLAN: A total of 33 minutes was spent with the patient and counseling/coordination of care regarding preparing for this visit, review of most recent office visit notes, review of chronic medical conditions under management, review of all medications, diagnosis of lower respiratory infection and need for antibiotics, symptom management, cough management, prognosis, documentation, and need for follow-up if no better or worse during the next several days.  Problem List Items Addressed This Visit       Respiratory   Lower respiratory infection - Primary   Upper viral respiratory infection now with secondary bacterial infection.  Will benefit from daily azithromycin  for 5 days. Clinically stable.  No signs of pneumonia. Advised to rest and stay well-hydrated. ED precautions given.      Relevant Medications   azithromycin  (ZITHROMAX ) 250 MG tablet     Other   Acute cough   Cough management discussed Continue over the counter Mucinex DM and cough drops Start Tessalon  200 mg 3 times a day Start Hycodan syrup as needed Advised to stay well-hydrated      Relevant Medications   HYDROcodone  bit-homatropine (HYCODAN) 5-1.5 MG/5ML syrup   benzonatate  (TESSALON ) 200 MG capsule   Flu-like symptoms   Symptom management discussed. Tylenol  as needed for headache and fever Advised to rest and stay well-hydrated Advised to contact the office if no better or worse during the next several days      Patient Instructions  Tos en los adultos Cough, Adult La tos ayuda a despejar la garganta y los pulmones. Puede ser un signo de una enfermedad u otra  afeccin. Una tos de corto plazo (aguda) puede durar de  2 a 3 semanas. Una tos prolongada (crnica) puede durar 8 semanas o ms tiempo. Hay muchas cosas que pueden causar tos. Entre ellas, se incluyen las siguientes: Enfermedades como: Una infeccin en la garganta o los pulmones. Asma u otros problemas cardacos o pulmonares. Reflujo gastroesofgico. Esto ocurre cuando el cido asciende desde el estmago. Inhalacin de cosas que alteran (irritan) los pulmones. Alergias. Goteo posnasal. Se produce cuando la mucosidad baja por la parte posterior de la garganta. Fumar. Algunos medicamentos. Siga estas indicaciones en su casa: Medicamentos Use los medicamentos de venta libre y los recetados solamente como se lo haya indicado el mdico. Hable con el mdico antes de tomar medicamentos para la tos (antitusivos). Comida y bebida No beba alcohol. No beber cafena. Beba suficiente lquido para Radio producer pis (la orina) de color amarillo plido. Estilo de vida Mantngase alejado del humo de cigarrillo. No fume ni consuma ningn producto que contenga nicotina o tabaco. Si necesita ayuda para dejar de consumir estos productos, consulte al mdico. Mantngase alejado de las cosas que lo hacen toser. Estas pueden incluir perfumes, velas, productos de limpieza o humo de fogatas. Indicaciones generales  Fjese si hay algn cambio en la tos. Dgale a su mdico sobre ellos. Siempre cbrase la boca al toser. Si el aire es muy seco en su casa, use un humidificador o un vaporizador de niebla fra. Si la tos empeora por la noche, pruebe con usar almohadas adicionales para elevar la cabeza mientras duerme. Descanse todo lo que sea necesario. Comunquese con un mdico si: Aparecen nuevos sntomas. Sus sntomas empeoran. Tose y escupe pus. Tiene fiebre que no desaparece. La tos no mejora despus de 2 a 3 semanas. Los medicamentos para la tos no lo [place] y usted no duerme bien. Siente que el dolor  empeora o no se alivia con los medicamentos. Est adelgazando y no sabe por qu. Transpira durante la noche. Solicite ayuda de inmediato si: Tose y [employer]. Tiene dificultad para respirar. Su corazn late muy rpidamente. Estos sntomas pueden Customer service manager. Solicite ayuda de inmediato. Llame al 911. No espere a ver si los sntomas desaparecen. No conduzca por sus propios medios [employer]. Esta informacin no tiene Theme park manager el consejo del mdico. Asegrese de hacerle al mdico cualquier pregunta que tenga. Document Revised: 11/20/2021 Document Reviewed: 11/20/2021 Elsevier Patient Education  2024 Elsevier Inc.    Maryagnes Small, MD [facility]

## 2023-07-07 NOTE — Patient Instructions (Signed)
 Tos en los adultos Cough, Adult La tos ayuda a despejar la garganta y los pulmones. Puede ser un signo de una enfermedad u otra afeccin. Una tos de corto plazo (aguda) puede durar de 2 a 3 semanas. Una tos prolongada (crnica) puede durar 8 semanas o ms tiempo. Hay muchas cosas que pueden causar tos. Entre ellas, se incluyen las siguientes: Enfermedades como: Una infeccin en la garganta o los pulmones. Asma u otros problemas cardacos o pulmonares. Reflujo gastroesofgico. Esto ocurre cuando el cido asciende desde el [place]. Inhalacin de cosas que alteran (irritan) los pulmones. Alergias. Goteo posnasal. Se produce cuando la mucosidad baja por la parte posterior de la garganta. Fumar. Algunos medicamentos. Siga estas indicaciones en su casa: Medicamentos Use los medicamentos de venta libre y los recetados solamente como se lo haya indicado el mdico. Hable con el mdico antes de tomar medicamentos para la tos (antitusivos). Comida y bebida No beba alcohol. No beber cafena. Beba suficiente lquido para Radio producer pis (la orina) de color amarillo plido. Estilo de vida Mantngase alejado del humo de cigarrillo. No fume ni consuma ningn producto que contenga nicotina o tabaco. Si necesita ayuda para dejar de consumir estos productos, consulte al mdico. Mantngase alejado de las cosas que lo hacen toser. Estas pueden incluir perfumes, velas, productos de limpieza o humo de fogatas. Indicaciones generales  Fjese si hay algn cambio en la tos. Dgale a su mdico sobre ellos. Siempre cbrase la boca al toser. Si el aire es muy seco en su casa, use un humidificador o un vaporizador de niebla fra. Si la tos empeora por la noche, pruebe con usar almohadas adicionales para elevar la cabeza mientras duerme. Descanse todo lo que sea necesario. Comunquese con un mdico si: Aparecen nuevos sntomas. Sus sntomas empeoran. Tose y escupe pus. Tiene fiebre que no desaparece. La tos  no mejora despus de 2 a 3 semanas. Los medicamentos para la tos no lo [place] y usted no duerme bien. Siente que el dolor empeora o no se alivia con los medicamentos. Est adelgazando y no sabe por qu. Transpira durante la noche. Solicite ayuda de inmediato si: Tose y [employer]. Tiene dificultad para respirar. Su corazn late muy rpidamente. Estos sntomas pueden Customer service manager. Solicite ayuda de inmediato. Llame al 911. No espere a ver si los sntomas desaparecen. No conduzca por sus propios medios [employer]. Esta informacin no tiene Theme park manager el consejo del mdico. Asegrese de hacerle al mdico cualquier pregunta que tenga. Document Revised: 11/20/2021 Document Reviewed: 11/20/2021 Elsevier Patient Education  2024 [employer].

## 2023-07-07 NOTE — Assessment & Plan Note (Signed)
 Cough management discussed Continue over the counter Mucinex DM and cough drops Start Tessalon  200 mg 3 times a day Start Hycodan syrup as needed Advised to stay well-hydrated

## 2023-07-07 NOTE — Assessment & Plan Note (Signed)
Upper viral respiratory infection now with secondary bacterial infection.  Will benefit from daily azithromycin for 5 days. Clinically stable.  No signs of pneumonia. Advised to rest and stay well-hydrated. ED precautions given.

## 2023-07-18 DIAGNOSIS — G4733 Obstructive sleep apnea (adult) (pediatric): Secondary | ICD-10-CM | POA: Diagnosis not present

## 2023-07-23 DIAGNOSIS — G4733 Obstructive sleep apnea (adult) (pediatric): Secondary | ICD-10-CM | POA: Diagnosis not present

## 2023-08-02 DIAGNOSIS — G4733 Obstructive sleep apnea (adult) (pediatric): Secondary | ICD-10-CM | POA: Diagnosis not present

## 2023-08-21 ENCOUNTER — Other Ambulatory Visit: Payer: Self-pay | Admitting: Emergency Medicine

## 2023-08-21 DIAGNOSIS — I10 Essential (primary) hypertension: Secondary | ICD-10-CM

## 2023-08-23 DIAGNOSIS — G4733 Obstructive sleep apnea (adult) (pediatric): Secondary | ICD-10-CM | POA: Diagnosis not present

## 2023-09-01 DIAGNOSIS — G4733 Obstructive sleep apnea (adult) (pediatric): Secondary | ICD-10-CM | POA: Diagnosis not present

## 2023-09-13 ENCOUNTER — Other Ambulatory Visit: Payer: Self-pay | Admitting: Emergency Medicine

## 2023-09-13 DIAGNOSIS — J449 Chronic obstructive pulmonary disease, unspecified: Secondary | ICD-10-CM

## 2023-09-22 DIAGNOSIS — G4733 Obstructive sleep apnea (adult) (pediatric): Secondary | ICD-10-CM | POA: Diagnosis not present

## 2023-10-03 ENCOUNTER — Other Ambulatory Visit: Payer: Self-pay | Admitting: Emergency Medicine

## 2023-10-03 DIAGNOSIS — J449 Chronic obstructive pulmonary disease, unspecified: Secondary | ICD-10-CM

## 2023-10-05 ENCOUNTER — Encounter: Payer: Self-pay | Admitting: Emergency Medicine

## 2023-10-06 ENCOUNTER — Other Ambulatory Visit: Payer: Self-pay | Admitting: Radiology

## 2023-10-06 DIAGNOSIS — J449 Chronic obstructive pulmonary disease, unspecified: Secondary | ICD-10-CM

## 2023-10-06 MED ORDER — TRELEGY ELLIPTA 100-62.5-25 MCG/ACT IN AEPB: INHALATION_SPRAY | RESPIRATORY_TRACT | 3 refills | Status: DC

## 2023-10-06 NOTE — Telephone Encounter (Signed)
 Patient has viewed results on MyChart.  Curtistine Quiet, CMA

## 2023-10-16 NOTE — Telephone Encounter (Signed)
 Should be for left knee but lets hold off until after her scheduled visit with Dr. Genelle / Ortho Surg on 10/21/23 to see if she will require surgery.   Thanks Trayce!

## 2023-10-19 ENCOUNTER — Other Ambulatory Visit: Payer: Self-pay | Admitting: Emergency Medicine

## 2023-10-19 DIAGNOSIS — J449 Chronic obstructive pulmonary disease, unspecified: Secondary | ICD-10-CM

## 2023-10-21 ENCOUNTER — Encounter (HOSPITAL_BASED_OUTPATIENT_CLINIC_OR_DEPARTMENT_OTHER): Payer: Self-pay | Admitting: Orthopaedic Surgery

## 2023-10-21 ENCOUNTER — Other Ambulatory Visit (HOSPITAL_BASED_OUTPATIENT_CLINIC_OR_DEPARTMENT_OTHER): Payer: Self-pay

## 2023-10-21 ENCOUNTER — Ambulatory Visit (HOSPITAL_BASED_OUTPATIENT_CLINIC_OR_DEPARTMENT_OTHER): Payer: Self-pay | Admitting: Orthopaedic Surgery

## 2023-10-21 ENCOUNTER — Ambulatory Visit (HOSPITAL_BASED_OUTPATIENT_CLINIC_OR_DEPARTMENT_OTHER): Admitting: Orthopaedic Surgery

## 2023-10-21 DIAGNOSIS — S83242A Other tear of medial meniscus, current injury, left knee, initial encounter: Secondary | ICD-10-CM

## 2023-10-21 MED ORDER — ASPIRIN 325 MG PO TBEC
325.0000 mg | DELAYED_RELEASE_TABLET | Freq: Every day | ORAL | 0 refills | Status: AC
  Filled 2023-10-21 – 2023-11-20 (×3): qty 14, 14d supply, fill #0

## 2023-10-21 MED ORDER — OXYCODONE HCL 5 MG PO TABS
5.0000 mg | ORAL_TABLET | ORAL | 0 refills | Status: DC | PRN
  Filled 2023-10-21 – 2023-11-20 (×2): qty 10, 2d supply, fill #0

## 2023-10-21 MED ORDER — ACETAMINOPHEN 500 MG PO TABS
500.0000 mg | ORAL_TABLET | Freq: Three times a day (TID) | ORAL | 0 refills | Status: AC
Start: 2023-10-21 — End: 2023-12-03
  Filled 2023-10-21 – 2023-11-20 (×3): qty 30, 10d supply, fill #0

## 2023-10-21 NOTE — Progress Notes (Signed)
 Chief Complaint: Left knee pain     History of Present Illness:    Dana Daniels is a 78 y.o. female presents with ongoing left knee pain which started over a year ago.  She states that she was going downstairs and felt a pop in the knee.  As a result she was not able to go to church that day.  She has done physical therapy before which has not given her permanent relief.  She has been taking Tylenol  as well as ibuprofen .  She has been seeing Dr. Joane who is referred her today for further workup.    PMH/PSH/Family History/Social History/Meds/Allergies:    Past Medical History:  Diagnosis Date   Allergy    Arthritis    Asthma    Blood transfusion without reported diagnosis    Cataract    Hyperlipidemia    Hypertension    Migraine    Osteoporosis    Sleep apnea    Past Surgical History:  Procedure Laterality Date   ABDOMINAL HYSTERECTOMY     CHOLECYSTECTOMY     EYE SURGERY Left    cataract    FOOT SURGERY Left    Social History   Socioeconomic History   Marital status: Widowed    Spouse name: Not on file   Number of children: Not on file   Years of education: Not on file   Highest education level: Associate degree: occupational, Scientist, product/process development, or vocational program  Occupational History   Not on file  Tobacco Use   Smoking status: Never   Smokeless tobacco: Never  Vaping Use   Vaping status: Never Used  Substance and Sexual Activity   Alcohol use: No   Drug use: Never   Sexual activity: Not on file  Other Topics Concern   Not on file  Social History Narrative   Not on file   Social Drivers of Health   Financial Resource Strain: Patient Declined (05/26/2023)   Overall Financial Resource Strain (CARDIA)    Difficulty of Paying Living Expenses: Patient declined  Food Insecurity: Patient Declined (05/26/2023)   Hunger Vital Sign    Worried About Running Out of Food in the Last Year: Patient declined    Ran Out of Food in the Last Year: Patient  declined  Transportation Needs: Patient Declined (05/26/2023)   PRAPARE - Administrator, Civil Service (Medical): Patient declined    Lack of Transportation (Non-Medical): Patient declined  Physical Activity: Insufficiently Active (05/26/2023)   Exercise Vital Sign    Days of Exercise per Week: 3 days    Minutes of Exercise per Session: 20 min  Stress: No Stress Concern Present (05/26/2023)   [employer] of Occupational Health - Occupational Stress Questionnaire    Feeling of Stress : Not at all  Social Connections: Unknown (05/26/2023)   Social Connection and Isolation Panel    Frequency of Communication with Friends and Family: More than three times a week    Frequency of Social Gatherings with Friends and Family: Twice a week    Attends Religious Services: Patient declined    Database administrator or Organizations: Patient declined    Attends Banker Meetings: Patient declined    Marital Status: Widowed   Family History  Problem Relation Age of Onset   Heart disease Mother    Hyperlipidemia Mother    Hypertension Mother    Stroke Mother    Heart disease Father    Stroke Father  Diabetes Daughter    Hypertension Daughter    Hypertension Son    Hyperlipidemia Son    Fibromyalgia Son    Lupus Son    Sleep apnea Son    Hypertension Son    Sleep apnea Son    Allergies  Allergen Reactions   Tramadol Itching   Current Outpatient Medications  Medication Sig Dispense Refill   acetaminophen  (TYLENOL ) 500 MG tablet Take 1 tablet (500 mg total) by mouth every 8 (eight) hours for 10 days. 30 tablet 0   aspirin  EC 325 MG tablet Take 1 tablet (325 mg total) by mouth daily. 14 tablet 0   oxyCODONE  (ROXICODONE ) 5 MG immediate release tablet Take 1 tablet (5 mg total) by mouth every 4 (four) hours as needed for severe pain (pain score 7-10) or breakthrough pain. 10 tablet 0   albuterol  (VENTOLIN  HFA) 108 (90 Base) MCG/ACT inhaler INHALE 1 TO 2 PUFFS BY  MOUTH EVERY 4 HOURS AS NEEDED FOR WHEEZING FOR SHORTNESS OF BREATH 9 g 0   azithromycin  (ZITHROMAX ) 250 MG tablet Sig as indicated 6 tablet 0   benzonatate  (TESSALON ) 200 MG capsule Take 1 capsule (200 mg total) by mouth 2 (two) times daily as needed for cough. 20 capsule 0   cetirizine  (ZYRTEC ) 10 MG tablet Take 1 tablet (10 mg total) by mouth daily. 10 tablet 11   cromolyn  (NASALCROM ) 5.2 MG/ACT nasal spray Place 1 spray into both nostrils 2 (two) times daily. 26 mL 12   diltiazem  (TIAZAC ) 240 MG 24 hr capsule Take 1 capsule by mouth once daily 90 capsule 0   Fluticasone -Umeclidin-Vilant (TRELEGY ELLIPTA ) 100-62.5-25 MCG/ACT AEPB Inhale 1 puff by mouth once daily 60 each 3   HYDROcodone  bit-homatropine (HYCODAN) 5-1.5 MG/5ML syrup Take 5 mLs by mouth at bedtime as needed for cough. 120 mL 0   montelukast  (SINGULAIR ) 10 MG tablet Take 1 tablet (10 mg total) by mouth at bedtime. 90 tablet 3   nitroGLYCERIN  (NITROSTAT ) 0.4 MG SL tablet Place 1 tablet (0.4 mg total) under the tongue every 5 (five) minutes as needed for chest pain. 25 tablet 0   omeprazole  (PRILOSEC) 40 MG capsule Take 1 capsule (40 mg total) by mouth daily. 90 capsule 3   simvastatin  (ZOCOR ) 20 MG tablet Take 1 tablet (20 mg total) by mouth daily. 90 tablet 3   Ubrogepant  (UBRELVY ) 100 MG TABS Take 1 tablet (100 mg total) by mouth daily as needed. Take at onset of migraine headache 15 tablet 3   zolpidem  (AMBIEN ) 5 MG tablet Take 1 tablet (5 mg total) by mouth at bedtime as needed for sleep. 15 tablet 1   No current facility-administered medications for this visit.   No results found.  Review of Systems:   A ROS was performed including pertinent positives and negatives as documented in the HPI.  Physical Exam :   Constitutional: NAD and appears stated age Neurological: Alert and oriented Psych: Appropriate affect and cooperative There were no vitals taken for this visit.   Comprehensive Musculoskeletal Exam:    Left knee  with medial joint line tenderness and positive McMurray.  Negative Lachman, negative posterior, no varus or valgus laxity.  Range of motion is from 0 to 130 degrees   Imaging:   Xray (4 views left knee): Normal  MRI (left knee): Medial meniscal root tear with extrusion   I personally reviewed and interpreted the radiographs.   Assessment and Plan:   78 y.o. female with a left knee medial meniscal  root tear with extrusion.  I did describe that overall she does not have evidence of of significant chondral loss on MRI.  Given that I do believe she be an excellent candidate for meniscal repair with centralization.  I did discuss risk and benefits as well as associated recovery timeframe.  After discussion she would like to proceed  -Plan for left knee arthroscopy with medial meniscal root repair and centralization   After a lengthy discussion of treatment options, including risks, benefits, alternatives, complications of surgical and nonsurgical conservative options, the patient elected surgical repair.   The patient  is aware of the material risks  and complications including, but not limited to injury to adjacent structures, neurovascular injury, infection, numbness, bleeding, implant failure, thermal burns, stiffness, persistent pain, failure to heal, disease transmission from allograft, need for further surgery, dislocation, anesthetic risks, blood clots, risks of death,and others. The probabilities of surgical success and failure discussed with patient given their particular co-morbidities.The time and nature of expected rehabilitation and recovery was discussed.The patient's questions were all answered preoperatively.  No barriers to understanding were noted. I explained the natural history of the disease process and Rx rationale.  I explained to the patient what I considered to be reasonable expectations given their personal situation.  The final treatment plan was arrived at through a  shared patient decision making process model.    I personally saw and evaluated the patient, and participated in the management and treatment plan.  Elspeth Parker, MD Attending Physician, Orthopedic Surgery  This document was dictated using Dragon voice recognition software. A reasonable attempt at proof reading has been made to minimize errors.

## 2023-10-22 NOTE — Telephone Encounter (Signed)
 Per visit note 10/21/23 with Dr. Genelle:  Assessment and Plan:   78 y.o. female with a left knee medial meniscal root tear with extrusion.  I did describe that overall she does not have evidence of of significant chondral loss on MRI.  Given that I do believe she be an excellent candidate for meniscal repair with centralization.  I did discuss risk and benefits as well as associated recovery timeframe.  After discussion she would like to proceed   -Plan for left knee arthroscopy with medial meniscal root repair and centralization     After a lengthy discussion of treatment options, including risks, benefits, alternatives, complications of surgical and nonsurgical conservative options, the patient elected surgical repair.    The patient  is aware of the material risks  and complications including, but not limited to injury to adjacent structures, neurovascular injury, infection, numbness, bleeding, implant failure, thermal burns, stiffness, persistent pain, failure to heal, disease transmission from allograft, need for further surgery, dislocation, anesthetic risks, blood clots, risks of death,and others. The probabilities of surgical success and failure discussed with patient given their particular co-morbidities.The time and nature of expected rehabilitation and recovery was discussed.The patient's questions were all answered preoperatively.  No barriers to understanding were noted. I explained the natural history of the disease process and Rx rationale.  I explained to the patient what I considered to be reasonable expectations given their personal situation.  The final treatment plan was arrived at through a shared patient decision making process model.

## 2023-10-23 DIAGNOSIS — G4733 Obstructive sleep apnea (adult) (pediatric): Secondary | ICD-10-CM | POA: Diagnosis not present

## 2023-11-02 DIAGNOSIS — G4733 Obstructive sleep apnea (adult) (pediatric): Secondary | ICD-10-CM | POA: Diagnosis not present

## 2023-11-03 ENCOUNTER — Other Ambulatory Visit: Payer: Self-pay | Admitting: Emergency Medicine

## 2023-11-03 ENCOUNTER — Other Ambulatory Visit (HOSPITAL_BASED_OUTPATIENT_CLINIC_OR_DEPARTMENT_OTHER): Payer: Self-pay

## 2023-11-03 DIAGNOSIS — J449 Chronic obstructive pulmonary disease, unspecified: Secondary | ICD-10-CM

## 2023-11-09 ENCOUNTER — Ambulatory Visit: Payer: 59

## 2023-11-18 ENCOUNTER — Other Ambulatory Visit: Payer: Self-pay | Admitting: Emergency Medicine

## 2023-11-18 DIAGNOSIS — J449 Chronic obstructive pulmonary disease, unspecified: Secondary | ICD-10-CM

## 2023-11-19 ENCOUNTER — Other Ambulatory Visit: Payer: Self-pay | Admitting: Emergency Medicine

## 2023-11-19 DIAGNOSIS — I10 Essential (primary) hypertension: Secondary | ICD-10-CM

## 2023-11-20 ENCOUNTER — Other Ambulatory Visit (HOSPITAL_COMMUNITY): Payer: Self-pay

## 2023-11-23 ENCOUNTER — Ambulatory Visit (INDEPENDENT_AMBULATORY_CARE_PROVIDER_SITE_OTHER): Admitting: Emergency Medicine

## 2023-11-23 ENCOUNTER — Encounter: Payer: Self-pay | Admitting: Emergency Medicine

## 2023-11-23 VITALS — BP 146/82 | HR 91 | Temp 99.0°F | Ht 60.0 in | Wt 165.0 lb

## 2023-11-23 DIAGNOSIS — I10 Essential (primary) hypertension: Secondary | ICD-10-CM | POA: Diagnosis not present

## 2023-11-23 DIAGNOSIS — C4922 Malignant neoplasm of connective and soft tissue of left lower limb, including hip: Secondary | ICD-10-CM

## 2023-11-23 DIAGNOSIS — M159 Polyosteoarthritis, unspecified: Secondary | ICD-10-CM

## 2023-11-23 DIAGNOSIS — J449 Chronic obstructive pulmonary disease, unspecified: Secondary | ICD-10-CM

## 2023-11-23 DIAGNOSIS — C44709 Unspecified malignant neoplasm of skin of left lower limb, including hip: Secondary | ICD-10-CM | POA: Diagnosis not present

## 2023-11-23 DIAGNOSIS — H9011 Conductive hearing loss, unilateral, right ear, with unrestricted hearing on the contralateral side: Secondary | ICD-10-CM | POA: Insufficient documentation

## 2023-11-23 DIAGNOSIS — E785 Hyperlipidemia, unspecified: Secondary | ICD-10-CM

## 2023-11-23 NOTE — Assessment & Plan Note (Signed)
Clinically stable.  No concerns.

## 2023-11-23 NOTE — Assessment & Plan Note (Signed)
 Clinically stable. Continues daily Trelegy with success No recent use of albuterol rescue inhaler

## 2023-11-23 NOTE — Assessment & Plan Note (Signed)
 Including both knees. Local injections and physical therapy helping  Follows up with orthopedist on a regular basis. Scheduled for left knee surgery next month.  Meniscal repair surgery

## 2023-11-23 NOTE — Assessment & Plan Note (Signed)
 Chronic stable condition Continue simvastatin 20 mg daily Diet and nutrition discussed

## 2023-11-23 NOTE — Assessment & Plan Note (Signed)
 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  Referral placed today.

## 2023-11-23 NOTE — Progress Notes (Signed)
 Dana Daniels 78 y.o.   Chief Complaint  Patient presents with   Follow-up    Patient here for f/u. She states her right ear has been hard to hear out of. Also mentions she is having knee surgery in her left knee Oct 6    HISTORY OF PRESENT ILLNESS: This is a 78 y.o. female here for 40-month follow-up of multiple chronic medical conditions Overall doing well Scheduled for left knee surgery next month Complaining of hearing loss on right ear for several weeks No other complaints or medical concerns today.   HPI   Prior to Admission medications   Medication Sig Start Date End Date Taking? Authorizing Provider  acetaminophen  (TYLENOL ) 500 MG tablet Take 1 tablet (500 mg total) by mouth every 8 (eight) hours for 10 days. 10/21/23 11/30/23 Yes Genelle Standing, MD  albuterol  (VENTOLIN  HFA) 108 (90 Base) MCG/ACT inhaler INHALE 1 TO 2 PUFFS BY MOUTH EVERY 4 HOURS AS NEEDED FOR WHEEZING FOR SHORTNESS OF BREATH 11/18/23  Yes Ica Daye, Emil Schanz, MD  aspirin  EC 325 MG tablet Take 1 tablet (325 mg total) by mouth daily. 10/21/23  Yes Genelle Standing, MD  azithromycin  (ZITHROMAX ) 250 MG tablet Sig as indicated 07/07/23  Yes Zierra Laroque, Emil Schanz, MD  benzonatate  (TESSALON ) 200 MG capsule Take 1 capsule (200 mg total) by mouth 2 (two) times daily as needed for cough. 07/07/23  Yes Nadiah Corbit, Emil Schanz, MD  cetirizine  (ZYRTEC ) 10 MG tablet Take 1 tablet (10 mg total) by mouth daily. 05/28/23 11/23/23 Yes Cuca Benassi Jose, MD  cromolyn  (NASALCROM ) 5.2 MG/ACT nasal spray Place 1 spray into both nostrils 2 (two) times daily. 05/28/23  Yes Purcell Emil Schanz, MD  diltiazem  (TIAZAC ) 240 MG 24 hr capsule Take 1 capsule by mouth once daily 11/19/23  Yes Betsabe Iglesia Jose, MD  Fluticasone -Umeclidin-Vilant (TRELEGY ELLIPTA ) 100-62.5-25 MCG/ACT AEPB Inhale 1 puff by mouth once daily 10/06/23  Yes Nedda Gains Jose, MD  HYDROcodone  bit-homatropine (HYCODAN) 5-1.5 MG/5ML syrup Take 5 mLs by mouth  at bedtime as needed for cough. 07/07/23  Yes Zakaree Mcclenahan, Emil Schanz, MD  montelukast  (SINGULAIR ) 10 MG tablet Take 1 tablet (10 mg total) by mouth at bedtime. 05/28/23 11/24/23 Yes Ardella Chhim, Emil Schanz, MD  nitroGLYCERIN  (NITROSTAT ) 0.4 MG SL tablet Place 1 tablet (0.4 mg total) under the tongue every 5 (five) minutes as needed for chest pain. 05/28/23  Yes Torey Regan, Emil Schanz, MD  omeprazole  (PRILOSEC) 40 MG capsule Take 1 capsule (40 mg total) by mouth daily. 05/28/23 05/22/24 Yes Disaya Walt, Emil Schanz, MD  oxyCODONE  (ROXICODONE ) 5 MG immediate release tablet Take 1 tablet (5 mg total) by mouth every 4 (four) hours as needed for severe pain (pain score 7-10) or breakthrough pain. 10/21/23  Yes Genelle Standing, MD  simvastatin  (ZOCOR ) 20 MG tablet Take 1 tablet (20 mg total) by mouth daily. 05/28/23 05/22/24 Yes Chase Arnall, Emil Schanz, MD  Ubrogepant  (UBRELVY ) 100 MG TABS Take 1 tablet (100 mg total) by mouth daily as needed. Take at onset of migraine headache 05/28/23  Yes Cheralyn Oliver, Emil Schanz, MD  zolpidem  (AMBIEN ) 5 MG tablet Take 1 tablet (5 mg total) by mouth at bedtime as needed for sleep. 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) 07/24/2020   Seasonal allergies 03/31/2018   History of gastroesophageal reflux (GERD) 09/25/2017   Osteoarthritis of multiple joints 03/24/2017   Chronic pain of left knee 03/24/2017   Chronic obstructive pulmonary disease (HCC) 03/24/2017   Primary cancer of skin of left foot 02/03/2017   Mild intermittent asthma without complication 02/03/2017   Obstructive sleep apnea 02/03/2017   Dyslipidemia 02/03/2017   Migraine 02/03/2017   Essential hypertension 09/18/2008    Past Medical History:   Diagnosis Date   Allergy    Arthritis    Asthma    Blood transfusion without reported diagnosis    Cataract    Hyperlipidemia    Hypertension    Migraine    Osteoporosis    Sleep apnea     Past Surgical History:  Procedure Laterality Date   ABDOMINAL HYSTERECTOMY     CHOLECYSTECTOMY     EYE SURGERY Left    cataract    FOOT SURGERY Left     Social History   Socioeconomic History   Marital status: Widowed    Spouse name: Not on file   Number of children: Not on file   Years of education: Not on file   Highest education level: Associate degree: occupational, Scientist, product/process development, or vocational program  Occupational History   Not on file  Tobacco Use   Smoking status: Never   Smokeless tobacco: Never  Vaping Use   Vaping status: Never Used  Substance and Sexual Activity   Alcohol use: No   Drug use: Never   Sexual activity: Not on file  Other Topics Concern   Not on file  Social History Narrative   Not on file   Social Drivers of Health   Financial Resource Strain: Patient Declined (05/26/2023)   Overall Financial Resource Strain (CARDIA)    Difficulty of Paying Living Expenses: Patient declined  Food Insecurity: Patient Declined (05/26/2023)   Hunger Vital Sign    Worried About Running Out of Food in the Last Year: Patient declined    Ran Out of Food in the Last Year: Patient declined  Transportation Needs: Patient Declined (05/26/2023)   PRAPARE - Administrator, Civil Service (Medical): Patient declined    Lack of Transportation (Non-Medical): Patient declined  Physical Activity: Insufficiently Active (05/26/2023)   Exercise Vital Sign    Days of Exercise per Week: 3 days    Minutes of Exercise per Session: 20 min  Stress: No Stress Concern Present (05/26/2023)   [employer] of Occupational Health - Occupational Stress Questionnaire    Feeling of Stress : Not at all  Social Connections: Unknown (05/26/2023)   Social Connection and Isolation Panel     Frequency of Communication with Friends and Family: More than three times a week    Frequency of Social Gatherings with Friends and Family: Twice a week    Attends Religious Services: Patient declined    Database administrator or Organizations: Patient declined    Attends Banker Meetings: Patient declined    Marital Status: Widowed  Intimate Partner Violence: Not At Risk (11/05/2022)   Humiliation, Afraid, Rape, and Kick questionnaire    Fear of Current or Ex-Partner: No    Emotionally Abused: No    Physically Abused: No    Sexually Abused: No    Family History  Problem Relation Age of Onset   Heart disease Mother    Hyperlipidemia Mother    Hypertension Mother    Stroke Mother  Heart disease Father    Stroke Father    Diabetes Daughter    Hypertension Daughter    Hypertension Son    Hyperlipidemia Son    Fibromyalgia Son    Lupus Son    Sleep apnea Son    Hypertension Son    Sleep apnea Son      Review of Systems  Constitutional: Negative.  Negative for chills and fever.  HENT:  Positive for hearing loss. Negative for congestion and sore throat.   Respiratory: Negative.  Negative for cough, sputum production and shortness of breath.   Cardiovascular: Negative.  Negative for chest pain and palpitations.  Gastrointestinal:  Negative for abdominal pain, nausea and vomiting.  Genitourinary: Negative.   Musculoskeletal:  Positive for joint pain.  Skin: Negative.  Negative for rash.  Neurological: Negative.  Negative for dizziness and headaches.  All other systems reviewed and are negative.   Vitals:   11/23/23 0934  BP: (!) 146/82  Pulse: 91  Temp: 99 F (37.2 C)  SpO2: 95%    Physical Exam Vitals reviewed.  Constitutional:      Appearance: Normal appearance.  HENT:     Head: Normocephalic.     Right Ear: There is impacted cerumen.     Left Ear: Tympanic membrane, ear canal and external ear normal.  Eyes:     Extraocular Movements:  Extraocular movements intact.     Pupils: Pupils are equal, round, and reactive to light.  Cardiovascular:     Rate and Rhythm: Normal rate and regular rhythm.     Pulses: Normal pulses.     Heart sounds: Normal heart sounds.  Pulmonary:     Effort: Pulmonary effort is normal.     Breath sounds: Normal breath sounds.  Musculoskeletal:     Cervical back: No tenderness.  Lymphadenopathy:     Cervical: No cervical adenopathy.  Skin:    General: Skin is warm and dry.     Capillary Refill: Capillary refill takes less than 2 seconds.  Neurological:     General: No focal deficit present.     Mental Status: She is alert and oriented to person, place, and time.  Psychiatric:        Mood and Affect: Mood normal.        Behavior: Behavior normal.     ASSESSMENT & PLAN: A total of 42 minutes was spent with the patient and counseling/coordination of care regarding preparing for this visit, review of most recent office visit notes, review of multiple chronic medical conditions and their management, cardiovascular risks associated with uncontrolled hypertension, review of all medications, review of most recent bloodwork results, review of health maintenance items, education on nutrition, prognosis, documentation, and need for follow up.   Problem List Items Addressed This Visit       Cardiovascular and Mediastinum   Essential hypertension - Primary   BP Readings from Last 3 Encounters:  11/23/23 (!) 146/82  07/07/23 (!) 148/84  05/28/23 (!) 144/76  Elevated blood pressure reading in the office but normal readings at home Continue diltiazem  240 mg daily Cardiovascular risks associated with uncontrolled hypertension discussed Dietary approaches to stop hypertension discussed        Respiratory   Chronic obstructive pulmonary disease (HCC)   Clinically stable. Continues daily Trelegy with success No recent use of albuterol  rescue inhaler          Nervous and Auditory   Conductive  hearing loss of right ear with unrestricted hearing of  left ear   Impacted wax deep into eardrum creating hearing loss Recommend ENT evaluation.  Referral placed today.      Relevant Orders   Ambulatory referral to ENT     Musculoskeletal and Integument   Primary cancer of skin of left foot   Clinically stable.  No concerns.      Osteoarthritis of multiple joints   Including both knees. Local injections and physical therapy helping  Follows up with orthopedist on a regular basis. Scheduled for left knee surgery next month.  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  Asymptomatic.      Patient Instructions  Mantenimiento de la salud despus de los 65 aos de edad Health Maintenance After Age 55 Despus de los 65 aos de edad, corre un riesgo mayor de Film/video editor enfermedades e infecciones a largo plazo, como tambin de sufrir lesiones por cadas. Las cadas son la causa principal de las fracturas de huesos y lesiones en la cabeza de personas mayores de 65 aos de edad. Recibir cuidados preventivos de forma regular puede ayudarlo a mantenerse saludable y en buen [place]. Los cuidados preventivos incluyen realizarse anlisis de forma regular y Forensic psychologist en el estilo de vida segn las recomendaciones del mdico. Converse con el mdico sobre lo siguiente: Las pruebas de deteccin y los anlisis que debe International aid/development worker. Una prueba de deteccin es un estudio que se para Engineer, manufacturing la presencia de una enfermedad cuando no tiene sntomas. Un plan de dieta y ejercicios adecuado para usted. Qu debo saber sobre las pruebas de deteccin y los anlisis para prevenir cadas? Realizarse pruebas de deteccin y [employer] es la mejor manera de Engineer, manufacturing un problema de salud de forma temprana. El diagnstico y  tratamiento tempranos le brindan la mejor oportunidad de Chief Operating Officer las afecciones mdicas que son comunes despus de los 65 aos de edad. Ciertas afecciones y elecciones de estilo de vida pueden hacer que sea ms propenso a sufrir Engineer, site. El mdico puede recomendarle lo siguiente: Controles regulares de la visin. Una visin deficiente y afecciones como las cataratas pueden hacer que sea ms propenso a sufrir Engineer, site. Si usa  lentes, asegrese de obtener una receta actualizada si su visin cambia. Revisin de medicamentos. Revise regularmente con el mdico todos los medicamentos que toma, incluidos los medicamentos de [place]. Consulte al [employer] efectos secundarios que pueden hacer que sea ms propenso a sufrir Engineer, site. Informe al mdico si alguno de los medicamentos que toma lo hace sentir mareado o somnoliento. Controles de fuerza y equilibrio. El mdico puede recomendar ciertos estudios para controlar su fuerza y equilibrio al estar de pie, al caminar o al cambiar de posicin. Examen de los pies. El dolor y Materials engineer en los pies, como tambin no utilizar el calzado adecuado, pueden hacer que sea ms propenso a sufrir Engineer, site. Pruebas de deteccin, que incluyen las siguientes: Pruebas de deteccin para la osteoporosis. La osteoporosis es una afeccin que hace que los huesos se tornen ms dbiles y se quiebren con ms facilidad. Pruebas de deteccin para la presin arterial. Los cambios en la presin arterial y los medicamentos para Chief Operating Officer la presin arterial pueden hacerlo sentir mareado. Prueba de deteccin de la depresin. Es ms probable que sufra una cada si tiene  miedo a caerse, se siente deprimido o se siente incapaz de Probation officer. Prueba de deteccin de consumo de alcohol. Beber demasiado alcohol puede afectar su equilibrio y puede hacer que sea ms propenso a sufrir Engineer, site. Siga estas indicaciones en su casa: Estilo de vida No beba  alcohol si: Su mdico le indica no hacerlo. Si bebe alcohol: Limite la cantidad que bebe a lo siguiente: De 0 a 1 medida por da para las mujeres. De 0 a 2 medidas por da para los hombres. Sepa cunta cantidad de alcohol hay en las bebidas que toma. En los [address], una medida equivale a una botella de cerveza de 12 oz (355 ml), un vaso de vino de 5 oz (148 ml) o un vaso de una bebida alcohlica de alta graduacin de 1 oz (44 ml). No consuma ningn producto que contenga nicotina o tabaco. Estos productos incluyen cigarrillos, tabaco para Theatre manager y aparatos de vapeo, como los cigarrillos electrnicos. Si necesita ayuda para dejar de consumir estos productos, consulte al [employer]. Actividad  Siga un programa de ejercicio regular para mantenerse en forma. Esto lo ayudar a Radio producer equilibrio. Consulte al mdico qu tipos de ejercicios son adecuados para usted. Si necesita un bastn o un andador, selo segn las recomendaciones del mdico. Utilice calzado con buen apoyo y suela antideslizante. Seguridad  Retire los [employer] puedan causar tropiezos tales como alfombras, cables u obstculos. Instale equipos de seguridad, como barras para sostn en los baos y barandas de seguridad en las escaleras. Mantenga las habitaciones y los pasillos bien iluminados. Indicaciones generales Hable con el mdico sobre sus riesgos de sufrir una cada. Infrmele a su mdico si: Se cae. Asegrese de informarle a su mdico acerca de todas las cadas, incluso aquellas que parecen ser [employer]. Se siente mareado, cansado (tiene fatiga) o siente que pierde el equilibrio. Use los medicamentos de venta libre y los recetados solamente como se lo haya indicado el mdico. Estos incluyen suplementos. Siga una dieta sana y [place] un peso saludable. Una dieta saludable incluye productos lcteos descremados, carnes bajas en contenido de grasa ([place]), fibra de granos enteros, frijoles y [place] frutas y  verduras. Mantngase al da con las vacunas. Realcese los estudios de rutina de la salud, dentales y de Wellsite geologist. Resumen Tener un estilo de vida saludable y recibir cuidados preventivos pueden ayudar a Research scientist (physical sciences) salud y el bienestar despus de los 65 aos de [place]. Realizarse pruebas de deteccin y anlisis es la mejor manera de Engineer, manufacturing un problema de salud de forma temprana y ayudarlo a Automotive engineer una cada. El diagnstico y tratamiento tempranos le brindan la mejor oportunidad de Chief Operating Officer las afecciones mdicas ms comunes en las personas mayores de 65 aos de edad. Las cadas son la causa principal de las fracturas de huesos y lesiones en la cabeza de personas mayores de 65 aos de edad. Tome precauciones para evitar una cada en su casa. Trabaje con el mdico para saber qu cambios que puede hacer para mejorar su salud y [place], y para prevenir las cadas. Esta informacin no tiene Theme park manager el consejo del mdico. Asegrese de hacerle al mdico cualquier pregunta que tenga. Document Revised: 07/25/2020 Document Reviewed: 07/25/2020 Elsevier Patient Education  2024 Elsevier Inc.     Emil Schaumann, MD [facility]

## 2023-11-23 NOTE — Assessment & Plan Note (Signed)
 BP Readings from Last 3 Encounters:  11/23/23 (!) 146/82  07/07/23 (!) 148/84  05/28/23 (!) 144/76  Elevated blood pressure reading in the office but normal readings at home Continue diltiazem  240 mg daily Cardiovascular risks associated with uncontrolled hypertension discussed Dietary approaches to stop hypertension discussed

## 2023-11-23 NOTE — Assessment & Plan Note (Signed)
 Stable. Asymptomatic.

## 2023-11-23 NOTE — Patient Instructions (Signed)
 Mantenimiento de la salud despus de los 65 aos de edad Health Maintenance After Age 76 Despus de los 65 aos de edad, corre un riesgo mayor de Film/video editor enfermedades e infecciones a largo plazo, como tambin de sufrir lesiones por cadas. Las cadas son la causa principal de las fracturas de huesos y lesiones en la cabeza de personas mayores de 65 aos de edad. Recibir cuidados preventivos de forma regular puede ayudarlo a mantenerse saludable y en buen [place]. Los cuidados preventivos incluyen realizarse anlisis de forma regular y Forensic psychologist en el estilo de vida segn las recomendaciones del mdico. Converse con el mdico sobre lo siguiente: Las pruebas de deteccin y los anlisis que debe International aid/development worker. Una prueba de deteccin es un estudio que se para Engineer, manufacturing la presencia de una enfermedad cuando no tiene sntomas. Un plan de dieta y ejercicios adecuado para usted. Qu debo saber sobre las pruebas de deteccin y los anlisis para prevenir cadas? Realizarse pruebas de deteccin y [employer] es la mejor manera de Engineer, manufacturing un problema de salud de forma temprana. El diagnstico y tratamiento tempranos le brindan la mejor oportunidad de Chief Operating Officer las afecciones mdicas que son comunes despus de los 65 aos de edad. Ciertas afecciones y elecciones de estilo de vida pueden hacer que sea ms propenso a sufrir Engineer, site. El mdico puede recomendarle lo siguiente: Controles regulares de la visin. Una visin deficiente y afecciones como las cataratas pueden hacer que sea ms propenso a sufrir Engineer, site. Si usa  lentes, asegrese de obtener una receta actualizada si su visin cambia. Revisin de medicamentos. Revise regularmente con el mdico todos los medicamentos que toma, incluidos los medicamentos de [place]. Consulte al [employer] efectos secundarios que pueden hacer que sea ms propenso a sufrir Engineer, site. Informe al mdico si alguno de los medicamentos que toma lo hace sentir mareado o  somnoliento. Controles de fuerza y equilibrio. El mdico puede recomendar ciertos estudios para controlar su fuerza y equilibrio al estar de pie, al caminar o al cambiar de posicin. Examen de los pies. El dolor y Materials engineer en los pies, como tambin no utilizar el calzado adecuado, pueden hacer que sea ms propenso a sufrir Engineer, site. Pruebas de deteccin, que incluyen las siguientes: Pruebas de deteccin para la osteoporosis. La osteoporosis es una afeccin que hace que los huesos se tornen ms dbiles y se quiebren con ms facilidad. Pruebas de deteccin para la presin arterial. Los cambios en la presin arterial y los medicamentos para Chief Operating Officer la presin arterial pueden hacerlo sentir mareado. Prueba de deteccin de la depresin. Es ms probable que sufra una cada si tiene miedo a caerse, se siente deprimido o se siente incapaz de Probation officer. Prueba de deteccin de consumo de alcohol. Beber demasiado alcohol puede afectar su equilibrio y puede hacer que sea ms propenso a sufrir Engineer, site. Siga estas indicaciones en su casa: Estilo de vida No beba alcohol si: Su mdico le indica no hacerlo. Si bebe alcohol: Limite la cantidad que bebe a lo siguiente: De 0 a 1 medida por da para las mujeres. De 0 a 2 medidas por da para los hombres. Sepa cunta cantidad de alcohol hay en las bebidas que toma. En los [address], una medida equivale a una botella de cerveza de 12 oz (355 ml), un vaso de vino de 5 oz (148 ml) o un vaso de una bebida alcohlica de alta graduacin de 1 oz (44 ml). No consuma ningn producto que  contenga nicotina o tabaco. Estos productos incluyen cigarrillos, tabaco para mascar y aparatos de vapeo, como los cigarrillos electrnicos. Si necesita ayuda para dejar de consumir estos productos, consulte al [employer]. Actividad  Siga un programa de ejercicio regular para mantenerse en forma. Esto lo ayudar a Radio producer equilibrio. Consulte al  mdico qu tipos de ejercicios son adecuados para usted. Si necesita un bastn o un andador, selo segn las recomendaciones del mdico. Utilice calzado con buen apoyo y suela antideslizante. Seguridad  Retire los [employer] puedan causar tropiezos tales como alfombras, cables u obstculos. Instale equipos de seguridad, como barras para sostn en los baos y barandas de seguridad en las escaleras. Mantenga las habitaciones y los pasillos bien iluminados. Indicaciones generales Hable con el mdico sobre sus riesgos de sufrir una cada. Infrmele a su mdico si: Se cae. Asegrese de informarle a su mdico acerca de todas las cadas, incluso aquellas que parecen ser [employer]. Se siente mareado, cansado (tiene fatiga) o siente que pierde el equilibrio. Use los medicamentos de venta libre y los recetados solamente como se lo haya indicado el mdico. Estos incluyen suplementos. Siga una dieta sana y [place] un peso saludable. Una dieta saludable incluye productos lcteos descremados, carnes bajas en contenido de grasa ([place]), fibra de granos enteros, frijoles y [place] frutas y verduras. Mantngase al da con las vacunas. Realcese los estudios de rutina de la salud, dentales y de Wellsite geologist. Resumen Tener un estilo de vida saludable y recibir cuidados preventivos pueden ayudar a Research scientist (physical sciences) salud y el bienestar despus de los 65 aos de [place]. Realizarse pruebas de deteccin y anlisis es la mejor manera de Engineer, manufacturing un problema de salud de forma temprana y ayudarlo a Automotive engineer una cada. El diagnstico y tratamiento tempranos le brindan la mejor oportunidad de Chief Operating Officer las afecciones mdicas ms comunes en las personas mayores de 65 aos de edad. Las cadas son la causa principal de las fracturas de huesos y lesiones en la cabeza de personas mayores de 65 aos de edad. Tome precauciones para evitar una cada en su casa. Trabaje con el mdico para saber qu cambios que puede hacer para mejorar su salud y  [place], y para prevenir las cadas. Esta informacin no tiene Theme park manager el consejo del mdico. Asegrese de hacerle al mdico cualquier pregunta que tenga. Document Revised: 07/25/2020 Document Reviewed: 07/25/2020 Elsevier Patient Education  2024 [employer].

## 2023-11-30 ENCOUNTER — Encounter (HOSPITAL_BASED_OUTPATIENT_CLINIC_OR_DEPARTMENT_OTHER): Payer: Self-pay | Admitting: Orthopaedic Surgery

## 2023-11-30 ENCOUNTER — Other Ambulatory Visit: Payer: Self-pay

## 2023-12-02 ENCOUNTER — Encounter (HOSPITAL_BASED_OUTPATIENT_CLINIC_OR_DEPARTMENT_OTHER)
Admission: RE | Admit: 2023-12-02 | Discharge: 2023-12-02 | Disposition: A | Discharge status: Home / Self Care | Source: Ambulatory Visit | Attending: Orthopaedic Surgery | Admitting: Orthopaedic Surgery

## 2023-12-02 DIAGNOSIS — Z01810 Encounter for preprocedural cardiovascular examination: Secondary | ICD-10-CM | POA: Diagnosis not present

## 2023-12-02 DIAGNOSIS — I10 Essential (primary) hypertension: Secondary | ICD-10-CM | POA: Insufficient documentation

## 2023-12-02 NOTE — Progress Notes (Signed)

## 2023-12-05 ENCOUNTER — Other Ambulatory Visit: Payer: Self-pay | Admitting: Emergency Medicine

## 2023-12-05 DIAGNOSIS — J449 Chronic obstructive pulmonary disease, unspecified: Secondary | ICD-10-CM

## 2023-12-07 ENCOUNTER — Encounter (HOSPITAL_BASED_OUTPATIENT_CLINIC_OR_DEPARTMENT_OTHER): Payer: Self-pay | Admitting: Orthopaedic Surgery

## 2023-12-07 ENCOUNTER — Ambulatory Visit (HOSPITAL_BASED_OUTPATIENT_CLINIC_OR_DEPARTMENT_OTHER): Admitting: Anesthesiology

## 2023-12-07 ENCOUNTER — Ambulatory Visit (HOSPITAL_BASED_OUTPATIENT_CLINIC_OR_DEPARTMENT_OTHER)
Admission: RE | Admit: 2023-12-07 | Discharge: 2023-12-07 | Disposition: A | Discharge status: Home / Self Care | Attending: Orthopaedic Surgery | Admitting: Orthopaedic Surgery

## 2023-12-07 ENCOUNTER — Encounter (HOSPITAL_BASED_OUTPATIENT_CLINIC_OR_DEPARTMENT_OTHER)
Admission: RE | Disposition: A | Discharge status: Home / Self Care | Payer: Self-pay | Source: Home / Self Care | Attending: Orthopaedic Surgery

## 2023-12-07 ENCOUNTER — Other Ambulatory Visit: Payer: Self-pay

## 2023-12-07 DIAGNOSIS — G4730 Sleep apnea, unspecified: Secondary | ICD-10-CM | POA: Insufficient documentation

## 2023-12-07 DIAGNOSIS — S83242A Other tear of medial meniscus, current injury, left knee, initial encounter: Secondary | ICD-10-CM

## 2023-12-07 DIAGNOSIS — M1990 Unspecified osteoarthritis, unspecified site: Secondary | ICD-10-CM | POA: Diagnosis not present

## 2023-12-07 DIAGNOSIS — I10 Essential (primary) hypertension: Secondary | ICD-10-CM | POA: Insufficient documentation

## 2023-12-07 DIAGNOSIS — K219 Gastro-esophageal reflux disease without esophagitis: Secondary | ICD-10-CM | POA: Diagnosis not present

## 2023-12-07 DIAGNOSIS — J449 Chronic obstructive pulmonary disease, unspecified: Secondary | ICD-10-CM | POA: Insufficient documentation

## 2023-12-07 DIAGNOSIS — M810 Age-related osteoporosis without current pathological fracture: Secondary | ICD-10-CM | POA: Diagnosis not present

## 2023-12-07 DIAGNOSIS — R519 Headache, unspecified: Secondary | ICD-10-CM | POA: Insufficient documentation

## 2023-12-07 DIAGNOSIS — X501XXA Overexertion from prolonged static or awkward postures, initial encounter: Secondary | ICD-10-CM | POA: Diagnosis not present

## 2023-12-07 DIAGNOSIS — Z79899 Other long term (current) drug therapy: Secondary | ICD-10-CM | POA: Insufficient documentation

## 2023-12-07 DIAGNOSIS — Z7982 Long term (current) use of aspirin: Secondary | ICD-10-CM | POA: Insufficient documentation

## 2023-12-07 HISTORY — PX: KNEE ARTHROSCOPY WITH MENISCAL REPAIR: SHX5653

## 2023-12-07 SURGERY — ARTHROSCOPY, KNEE, WITH MENISCUS REPAIR
Anesthesia: General | Site: Knee | Laterality: Left

## 2023-12-07 MED ORDER — DEXAMETHASONE SODIUM PHOSPHATE 10 MG/ML IJ SOLN
INTRAMUSCULAR | Status: DC | PRN
  Administered 2023-12-07: 8 mg via INTRAVENOUS

## 2023-12-07 MED ORDER — ACETAMINOPHEN 500 MG PO TABS: 1000.0000 mg | ORAL_TABLET | Freq: Once | ORAL | Status: DC

## 2023-12-07 MED ORDER — FENTANYL CITRATE (PF) 100 MCG/2ML IJ SOLN
25.0000 ug | INTRAMUSCULAR | Status: DC | PRN
  Administered 2023-12-07 (×2): 50 ug via INTRAVENOUS

## 2023-12-07 MED ORDER — ACETAMINOPHEN 500 MG PO TABS
ORAL_TABLET | ORAL | Status: AC
  Filled 2023-12-07: qty 2

## 2023-12-07 MED ORDER — BUPIVACAINE HCL (PF) 0.25 % IJ SOLN
INTRAMUSCULAR | Status: AC
  Filled 2023-12-07: qty 30

## 2023-12-07 MED ORDER — FENTANYL CITRATE (PF) 100 MCG/2ML IJ SOLN
INTRAMUSCULAR | Status: DC | PRN
  Administered 2023-12-07 (×2): 50 ug via INTRAVENOUS

## 2023-12-07 MED ORDER — TRANEXAMIC ACID-NACL 1000-0.7 MG/100ML-% IV SOLN
1000.0000 mg | INTRAVENOUS | Status: AC
  Administered 2023-12-07: 1000 mg via INTRAVENOUS

## 2023-12-07 MED ORDER — CEFAZOLIN SODIUM-DEXTROSE 2-4 GM/100ML-% IV SOLN
INTRAVENOUS | Status: AC
  Filled 2023-12-07: qty 100

## 2023-12-07 MED ORDER — OXYCODONE HCL 5 MG PO TABS
ORAL_TABLET | ORAL | Status: AC
  Filled 2023-12-07: qty 1

## 2023-12-07 MED ORDER — LACTATED RINGERS IV SOLN: INTRAVENOUS | Status: DC

## 2023-12-07 MED ORDER — ACETAMINOPHEN 500 MG PO TABS
1000.0000 mg | ORAL_TABLET | Freq: Once | ORAL | Status: AC
  Administered 2023-12-07: 1000 mg via ORAL

## 2023-12-07 MED ORDER — OXYCODONE HCL 5 MG PO TABS
5.0000 mg | ORAL_TABLET | Freq: Once | ORAL | Status: AC | PRN
  Administered 2023-12-07: 5 mg via ORAL

## 2023-12-07 MED ORDER — TRANEXAMIC ACID-NACL 1000-0.7 MG/100ML-% IV SOLN
INTRAVENOUS | Status: AC
  Filled 2023-12-07: qty 100

## 2023-12-07 MED ORDER — BUPIVACAINE HCL (PF) 0.25 % IJ SOLN
INTRAMUSCULAR | Status: DC | PRN
  Administered 2023-12-07: 20 mL

## 2023-12-07 MED ORDER — KETOROLAC TROMETHAMINE 30 MG/ML IJ SOLN
INTRAMUSCULAR | Status: DC | PRN
  Administered 2023-12-07: 30 mg via INTRAVENOUS

## 2023-12-07 MED ORDER — FENTANYL CITRATE (PF) 100 MCG/2ML IJ SOLN
INTRAMUSCULAR | Status: AC
  Filled 2023-12-07: qty 2

## 2023-12-07 MED ORDER — GABAPENTIN 300 MG PO CAPS
ORAL_CAPSULE | ORAL | Status: AC
  Filled 2023-12-07: qty 1

## 2023-12-07 MED ORDER — PHENYLEPHRINE 80 MCG/ML (10ML) SYRINGE FOR IV PUSH (FOR BLOOD PRESSURE SUPPORT)
PREFILLED_SYRINGE | INTRAVENOUS | Status: DC | PRN
  Administered 2023-12-07: 160 ug via INTRAVENOUS
  Administered 2023-12-07: 80 ug via INTRAVENOUS

## 2023-12-07 MED ORDER — LIDOCAINE 2% (20 MG/ML) 5 ML SYRINGE
INTRAMUSCULAR | Status: DC | PRN
  Administered 2023-12-07: 40 mg via INTRAVENOUS

## 2023-12-07 MED ORDER — CEFAZOLIN SODIUM-DEXTROSE 2-4 GM/100ML-% IV SOLN
2.0000 g | INTRAVENOUS | Status: AC
  Administered 2023-12-07: 2 g via INTRAVENOUS

## 2023-12-07 MED ORDER — AMISULPRIDE (ANTIEMETIC) 5 MG/2ML IV SOLN: 10.0000 mg | Freq: Once | INTRAVENOUS | Status: DC | PRN

## 2023-12-07 MED ORDER — ONDANSETRON HCL 4 MG/2ML IJ SOLN
INTRAMUSCULAR | Status: DC | PRN
Start: 2023-12-07 — End: 2023-12-07
  Administered 2023-12-07: 4 mg via INTRAVENOUS

## 2023-12-07 MED ORDER — SODIUM CHLORIDE 0.9 % IR SOLN
Status: DC | PRN
  Administered 2023-12-07: 6000 mL

## 2023-12-07 MED ORDER — GABAPENTIN 300 MG PO CAPS: 300.0000 mg | ORAL_CAPSULE | Freq: Once | ORAL | Status: DC

## 2023-12-07 MED ORDER — ONDANSETRON HCL 4 MG/2ML IJ SOLN
INTRAMUSCULAR | Status: AC
  Filled 2023-12-07: qty 8

## 2023-12-07 MED ORDER — LIDOCAINE 2% (20 MG/ML) 5 ML SYRINGE
INTRAMUSCULAR | Status: AC
  Filled 2023-12-07: qty 10

## 2023-12-07 MED ORDER — OXYCODONE HCL 5 MG/5ML PO SOLN: 5.0000 mg | Freq: Once | ORAL | Status: AC | PRN

## 2023-12-07 MED ORDER — PROPOFOL 10 MG/ML IV BOLUS
INTRAVENOUS | Status: DC | PRN
  Administered 2023-12-07: 160 mg via INTRAVENOUS

## 2023-12-07 MED ORDER — EPHEDRINE 5 MG/ML INJ
INTRAVENOUS | Status: AC
Start: 2023-12-07 — End: 2023-12-07
  Filled 2023-12-07: qty 10

## 2023-12-07 SURGICAL SUPPLY — 44 items
ANCH JGGRKNOTLESS OC 1.5 MB (Anchor) IMPLANT
BLADE EXCALIBUR 4.0X13 (MISCELLANEOUS) IMPLANT
BLADE SURG 15 STRL LF DISP TIS (BLADE) IMPLANT
BNDG ELASTIC 6INX 5YD STR LF (GAUZE/BANDAGES/DRESSINGS) ×2 IMPLANT
CHLORAPREP W/TINT 26 (MISCELLANEOUS) ×2 IMPLANT
COOLER ICEMAN CLASSIC (MISCELLANEOUS) ×2 IMPLANT
DISSECTOR 3.8MM X 13CM (MISCELLANEOUS) ×2 IMPLANT
DRAPE IMP U-DRAPE 54X76 (DRAPES) IMPLANT
DRAPE INCISE IOBAN 66X45 STRL (DRAPES) IMPLANT
DRAPE U-SHAPE 47X51 STRL (DRAPES) ×2 IMPLANT
DRAPE-T ARTHROSCOPY W/POUCH (DRAPES) ×2 IMPLANT
DW OUTFLOW CASSETTE/TUBE SET (MISCELLANEOUS) IMPLANT
ELECTRODE REM PT RTRN 9FT ADLT (ELECTROSURGICAL) IMPLANT
EXCALIBUR 3.8MM X 13CM (MISCELLANEOUS) IMPLANT
GAUZE PAD ABD 8X10 STRL (GAUZE/BANDAGES/DRESSINGS) IMPLANT
GAUZE SPONGE 4X4 12PLY STRL (GAUZE/BANDAGES/DRESSINGS) ×2 IMPLANT
GAUZE XEROFORM 1X8 LF (GAUZE/BANDAGES/DRESSINGS) ×2 IMPLANT
GLOVE BIO SURGEON STRL SZ 6 (GLOVE) ×2 IMPLANT
GLOVE BIO SURGEON STRL SZ7.5 (GLOVE) ×2 IMPLANT
GLOVE BIOGEL PI IND STRL 6.5 (GLOVE) ×2 IMPLANT
GLOVE BIOGEL PI IND STRL 8 (GLOVE) ×2 IMPLANT
GOWN STRL REUS W/ TWL LRG LVL3 (GOWN DISPOSABLE) ×4 IMPLANT
GOWN STRL REUS W/ TWL XL LVL3 (GOWN DISPOSABLE) ×2 IMPLANT
KIT JUGGERKNOT DISP 1.5MM (ORTHOPEDIC DISPOSABLE SUPPLIES) IMPLANT
KIT STR SPEAR 1.8 FBRTK DISP (KITS) IMPLANT
LASSO 90 CVE QUICKPAS (DISPOSABLE) IMPLANT
LOOP 2 FIBERLINK CLOSED (SUTURE) IMPLANT
MANIFOLD NEPTUNE II (INSTRUMENTS) ×2 IMPLANT
NDL SUT 2-0 SCORPION KNEE (NEEDLE) IMPLANT
NEEDLE SUT 2-0 SCORPION KNEE (NEEDLE) IMPLANT
PACK ARTHROSCOPY DSU (CUSTOM PROCEDURE TRAY) ×2 IMPLANT
PACK BASIN DAY SURGERY FS (CUSTOM PROCEDURE TRAY) ×2 IMPLANT
PAD COLD SHLDR WRAP-ON (PAD) ×2 IMPLANT
PADDING CAST COTTON 6X4 STRL (CAST SUPPLIES) ×2 IMPLANT
PASSER SUT EZ PASS 90D UP (ORTHOPEDIC DISPOSABLE SUPPLIES) IMPLANT
PENCIL SMOKE EVACUATOR (MISCELLANEOUS) IMPLANT
SLEEVE SCD COMPRESS KNEE MED (STOCKING) ×2 IMPLANT
SUCTION TUBE FRAZIER 10FR DISP (SUCTIONS) IMPLANT
SUT ETHILON 3 0 PS 1 (SUTURE) ×2 IMPLANT
SUT VIC AB 0 CT1 27XBRD ANBCTR (SUTURE) IMPLANT
SUT VIC AB 2-0 CT1 TAPERPNT 27 (SUTURE) IMPLANT
TOWEL GREEN STERILE FF (TOWEL DISPOSABLE) ×4 IMPLANT
TUBING ARTHROSCOPY IRRIG 16FT (MISCELLANEOUS) ×2 IMPLANT
WAND ABLATOR APOLLO I90 (BUR) ×2 IMPLANT

## 2023-12-07 NOTE — Anesthesia Preprocedure Evaluation (Addendum)
 Anesthesia Evaluation  Patient identified by MRN, date of birth, ID band Patient awake    Reviewed: Allergy & Precautions, NPO status , Patient's Chart, lab work & pertinent test results  Airway Mallampati: II  TM Distance: >3 FB Neck ROM: Full    Dental  (+) Teeth Intact, Dental Advisory Given   Pulmonary asthma , sleep apnea , COPD   breath sounds clear to auscultation       Cardiovascular hypertension, Pt. on medications  Rhythm:Regular Rate:Normal  Echo:  Left ventricle: The cavity size was normal. Wall thickness was    normal. Systolic function was normal. The estimated ejection    fraction was in the range of 60% to 65%. Left ventricular    diastolic function parameters were normal.  - Mitral valve: Unusual linear area of &quot;rail-road&quot; track    calcification near mitral annulus  - Atrial septum: No defect or patent foramen ovale was identified.      Neuro/Psych  Headaches  negative psych ROS   GI/Hepatic Neg liver ROS,GERD  Medicated,,  Endo/Other  negative endocrine ROS    Renal/GU negative Renal ROS     Musculoskeletal  (+) Arthritis ,    Abdominal   Peds  Hematology negative hematology ROS (+)   Anesthesia Other Findings   Reproductive/Obstetrics                              Anesthesia Physical Anesthesia Plan  ASA: 2  Anesthesia Plan: General   Post-op Pain Management: Tylenol  PO (pre-op)* and Toradol IV (intra-op)*   Induction: Intravenous  PONV Risk Score and Plan: 4 or greater and Ondansetron, Dexamethasone, Midazolam and Treatment may vary due to age or medical condition  Airway Management Planned: LMA  Additional Equipment: None  Intra-op Plan:   Post-operative Plan: Extubation in OR  Informed Consent: I have reviewed the patients History and Physical, chart, labs and discussed the procedure including the risks, benefits and alternatives for the  proposed anesthesia with the patient or authorized representative who has indicated his/her understanding and acceptance.     Dental advisory given  Plan Discussed with: CRNA  Anesthesia Plan Comments:          Anesthesia Quick Evaluation

## 2023-12-07 NOTE — Discharge Instructions (Addendum)
 Discharge Instructions    Attending Surgeon: Elspeth Parker, MD Office Phone Number: [phone]   Diagnosis and Procedures:    Surgeries Performed: Left knee medial meniscal repair  Discharge Plan:    Diet: Resume usual diet. Begin with light or bland foods.  Drink plenty of fluids.  Activity:  Weight bearing as tolerated. You are advised to go home directly from the hospital or surgical center. Restrict your activities.  GENERAL INSTRUCTIONS: 1.  Please apply ice to your wound to help with swelling and inflammation. This will improve your comfort and your overall recovery following surgery.     2. Please call Dr. Danetta office at [phone] with questions Monday-Friday during business hours. If no one answers, please leave a message and someone should get back to the patient within 24 hours. For emergencies please call 911 or proceed to the emergency room.   3. Patient to notify surgical team if experiences any of the following: Bowel/Bladder dysfunction, uncontrolled pain, nerve/muscle weakness, incision with increased drainage or redness, nausea/vomiting and Fever greater than 101.0 F.  Be alert for signs of infection including redness, streaking, odor, fever or chills. Be alert for excessive pain or bleeding and notify your surgeon immediately.  WOUND INSTRUCTIONS:   Leave your dressing, cast, or splint in place until your post operative visit.  Keep it clean and dry.  Always keep the incision clean and dry until the staples/sutures are removed. If there is no drainage from the incision you should keep it open to air. If there is drainage from the incision you must keep it covered at all times until the drainage stops  Do not soak in a bath tub, hot tub, pool, lake or other body of water until 21 days after your surgery and your incision is completely dry and healed.  If you have removable sutures (or staples) they must be removed 10-14 days (unless otherwise  instructed) from the day of your surgery.     1)  Elevate the extremity as much as possible.  2)  Keep the dressing clean and dry.  3)  Please call us  if the dressing becomes wet or dirty.  4)  If you are experiencing worsening pain or worsening swelling, please call.     MEDICATIONS: Resume all previous home medications at the previous prescribed dose and frequency unless otherwise noted Start taking the  pain medications on an as-needed basis as prescribed  Please taper down pain medication over the next week following surgery.  Ideally you should not require a refill of any narcotic pain medication.  Take pain medication with food to minimize nausea. In addition to the prescribed pain medication, you may take over-the-counter pain relievers such as Tylenol .  Do NOT take additional tylenol  if your pain medication already has tylenol  in it. No Tylenol  until after 8:00pm today. Last dose of Ibuprofen  was 4:15pm today. Aspirin  325mg  daily per instructions on bottle. Narcotic policy: Per [facility] clinic policy, our goal is ensure optimal postoperative pain control with a multimodal pain management strategy. For all [facility] patients, our goal is to wean post-operative narcotic medications by 6 weeks post-operatively, and many times sooner. If this is not possible due to utilization of pain medication prior to surgery, your [facility] doctor will support your acute post-operative pain control for the first 6 weeks postoperatively, with a plan to transition you back to your primary pain team following that. Maralee will work to ensure a Therapist, occupational.  FOLLOWUP INSTRUCTIONS: 1. Follow up at the Physical Therapy Clinic 3-4 days following surgery. This appointment should be scheduled unless other arrangements have been made.The Physical Therapy scheduling number is [phone] if an appointment has not already been arranged.  2. Contact Dr. Danetta office during office hours at [phone] or the practice after hours line at [phone] for non-emergencies. For medical emergencies call 911.   Discharge Location: Home   Post Anesthesia Home Care Instructions  Activity: Get plenty of rest for the remainder of the day. A responsible individual must stay with you for 24 hours following the procedure.  For the next 24 hours, DO NOT: -Drive a car -Advertising copywriter -Drink alcoholic beverages -Take any medication unless instructed by your physician -Make any legal decisions or sign important papers.  Meals: Start with liquid foods such as gelatin or soup. Progress to regular foods as tolerated. Avoid greasy, spicy, heavy foods. If nausea and/or vomiting occur, drink only clear liquids until the nausea and/or vomiting subsides. Call your physician if vomiting continues.  Special Instructions/Symptoms: Your throat may feel dry or sore from the anesthesia or the breathing tube placed in your throat during surgery. If this causes discomfort, gargle with warm salt water. The discomfort should disappear within 24 hours.  If you had a scopolamine patch placed behind your ear for the management of post- operative nausea and/or vomiting:  1. The medication in the patch is effective for 72 hours, after which it should be removed.  Wrap patch in a tissue and discard in the trash. Wash hands thoroughly with soap and water. 2. You may remove the patch earlier than 72 hours if you experience unpleasant side effects which may include dry mouth, dizziness or visual disturbances. 3. Avoid touching the patch. Wash your hands with soap and water after contact with the patch.

## 2023-12-07 NOTE — Transfer of Care (Signed)
 Immediate Anesthesia Transfer of Care Note  Patient: Dana Daniels  Procedure(s) Performed: ARTHROSCOPY, KNEE, WITH MENISCUS REPAIR (Left: Knee)  Patient Location: PACU  Anesthesia Type:General  Level of Consciousness: awake, oriented, and patient cooperative  Airway & Oxygen Therapy: Patient Spontanous Breathing and Patient connected to face mask oxygen  Post-op Assessment: Report given to RN and Post -op Vital signs reviewed and stable  Post vital signs: Reviewed and stable  Last Vitals:  Vitals Value Taken Time  BP 141/65 12/07/23 16:18  Temp 36.3 C 12/07/23 16:18  Pulse 77 12/07/23 16:22  Resp 15 12/07/23 16:22  SpO2 100 % 12/07/23 16:22  Vitals shown include unfiled device data.  Last Pain:  Vitals:   12/07/23 1353  TempSrc: Tympanic  PainSc: 3       Patients Stated Pain Goal: 7 (12/07/23 1353)  Complications: No notable events documented.

## 2023-12-07 NOTE — H&P (Signed)
 Expand All Collapse All       Chief Complaint: Left knee pain        History of Present Illness:      Dana Daniels is a 78 y.o. female presents with ongoing left knee pain which started over a year ago.  She states that she was going downstairs and felt a pop in the knee.  As a result she was not able to go to church that day.  She has done physical therapy before which has not given her permanent relief.  She has been taking Tylenol  as well as ibuprofen .  She has been seeing Dr. Joane who is referred her today for further workup.       PMH/PSH/Family History/Social History/Meds/Allergies:         Past Medical History:  Diagnosis Date   Allergy     Arthritis     Asthma     Blood transfusion without reported diagnosis     Cataract     Hyperlipidemia     Hypertension     Migraine     Osteoporosis     Sleep apnea               Past Surgical History:  Procedure Laterality Date   ABDOMINAL HYSTERECTOMY       CHOLECYSTECTOMY       EYE SURGERY Left      cataract    FOOT SURGERY Left          Social History         Socioeconomic History   Marital status: Widowed      Spouse name: Not on file   Number of children: Not on file   Years of education: Not on file   Highest education level: Associate degree: occupational, Scientist, product/process development, or vocational program  Occupational History   Not on file  Tobacco Use   Smoking status: Never   Smokeless tobacco: Never  Vaping Use   Vaping status: Never Used  Substance and Sexual Activity   Alcohol use: No   Drug use: Never   Sexual activity: Not on file  Other Topics Concern   Not on file  Social History Narrative   Not on file    Social Drivers of Health        Financial Resource Strain: Patient Declined (05/26/2023)    Overall Financial Resource Strain (CARDIA)     Difficulty of Paying Living Expenses: Patient declined  Food Insecurity: Patient Declined (05/26/2023)    Hunger Vital Sign     Worried About  Running Out of Food in the Last Year: Patient declined     Ran Out of Food in the Last Year: Patient declined  Transportation Needs: Patient Declined (05/26/2023)    PRAPARE - Therapist, art (Medical): Patient declined     Lack of Transportation (Non-Medical): Patient declined  Physical Activity: Insufficiently Active (05/26/2023)    Exercise Vital Sign     Days of Exercise per Week: 3 days     Minutes of Exercise per Session: 20 min  Stress: No Stress Concern Present (05/26/2023)    [employer] of Occupational Health - Occupational Stress Questionnaire     Feeling of Stress : Not at all  Social Connections: Unknown (05/26/2023)    Social Connection and Isolation Panel     Frequency of Communication with Friends and Family: More than three times a week     Frequency of Social Gatherings with Friends  and Family: Twice a week     Attends Religious Services: Patient declined     Active Member of Clubs or Organizations: Patient declined     Attends Banker Meetings: Patient declined     Marital Status: Widowed         Family History  Problem Relation Age of Onset   Heart disease Mother     Hyperlipidemia Mother     Hypertension Mother     Stroke Mother     Heart disease Father     Stroke Father     Diabetes Daughter     Hypertension Daughter     Hypertension Son     Hyperlipidemia Son     Fibromyalgia Son     Lupus Son     Sleep apnea Son     Hypertension Son     Sleep apnea Son          Allergies      Allergies  Allergen Reactions   Tramadol Itching            Current Outpatient Medications  Medication Sig Dispense Refill   acetaminophen  (TYLENOL ) 500 MG tablet Take 1 tablet (500 mg total) by mouth every 8 (eight) hours for 10 days. 30 tablet 0   aspirin  EC 325 MG tablet Take 1 tablet (325 mg total) by mouth daily. 14 tablet 0   oxyCODONE  (ROXICODONE ) 5 MG immediate release tablet Take 1 tablet (5 mg total) by mouth every 4  (four) hours as needed for severe pain (pain score 7-10) or breakthrough pain. 10 tablet 0   albuterol  (VENTOLIN  HFA) 108 (90 Base) MCG/ACT inhaler INHALE 1 TO 2 PUFFS BY MOUTH EVERY 4 HOURS AS NEEDED FOR WHEEZING FOR SHORTNESS OF BREATH 9 g 0   azithromycin  (ZITHROMAX ) 250 MG tablet Sig as indicated 6 tablet 0   benzonatate  (TESSALON ) 200 MG capsule Take 1 capsule (200 mg total) by mouth 2 (two) times daily as needed for cough. 20 capsule 0   cetirizine  (ZYRTEC ) 10 MG tablet Take 1 tablet (10 mg total) by mouth daily. 10 tablet 11   cromolyn  (NASALCROM ) 5.2 MG/ACT nasal spray Place 1 spray into both nostrils 2 (two) times daily. 26 mL 12   diltiazem  (TIAZAC ) 240 MG 24 hr capsule Take 1 capsule by mouth once daily 90 capsule 0   Fluticasone -Umeclidin-Vilant (TRELEGY ELLIPTA ) 100-62.5-25 MCG/ACT AEPB Inhale 1 puff by mouth once daily 60 each 3   HYDROcodone  bit-homatropine (HYCODAN) 5-1.5 MG/5ML syrup Take 5 mLs by mouth at bedtime as needed for cough. 120 mL 0   montelukast  (SINGULAIR ) 10 MG tablet Take 1 tablet (10 mg total) by mouth at bedtime. 90 tablet 3   nitroGLYCERIN  (NITROSTAT ) 0.4 MG SL tablet Place 1 tablet (0.4 mg total) under the tongue every 5 (five) minutes as needed for chest pain. 25 tablet 0   omeprazole  (PRILOSEC) 40 MG capsule Take 1 capsule (40 mg total) by mouth daily. 90 capsule 3   simvastatin  (ZOCOR ) 20 MG tablet Take 1 tablet (20 mg total) by mouth daily. 90 tablet 3   Ubrogepant  (UBRELVY ) 100 MG TABS Take 1 tablet (100 mg total) by mouth daily as needed. Take at onset of migraine headache 15 tablet 3   zolpidem  (AMBIEN ) 5 MG tablet Take 1 tablet (5 mg total) by mouth at bedtime as needed for sleep. 15 tablet 1      No current facility-administered medications for this visit.      Imaging Results (  Last 48 hours)  No results found.     Review of Systems:   A ROS was performed including pertinent positives and negatives as documented in the HPI.   Physical Exam :    Constitutional: NAD and appears stated age Neurological: Alert and oriented Psych: Appropriate affect and cooperative There were no vitals taken for this visit.    Comprehensive Musculoskeletal Exam:     Left knee with medial joint line tenderness and positive McMurray.  Negative Lachman, negative posterior, no varus or valgus laxity.  Range of motion is from 0 to 130 degrees     Imaging:   Xray (4 views left knee): Normal   MRI (left knee): Medial meniscal root tear with extrusion     I personally reviewed and interpreted the radiographs.     Assessment and Plan:   78 y.o. female with a left knee medial meniscal root tear with extrusion.  I did describe that overall she does not have evidence of of significant chondral loss on MRI.  Given that I do believe she be an excellent candidate for meniscal repair with centralization.  I did discuss risk and benefits as well as associated recovery timeframe.  After discussion she would like to proceed   -Plan for left knee arthroscopy with medial meniscal root repair and centralization     After a lengthy discussion of treatment options, including risks, benefits, alternatives, complications of surgical and nonsurgical conservative options, the patient elected surgical repair.    The patient  is aware of the material risks  and complications including, but not limited to injury to adjacent structures, neurovascular injury, infection, numbness, bleeding, implant failure, thermal burns, stiffness, persistent pain, failure to heal, disease transmission from allograft, need for further surgery, dislocation, anesthetic risks, blood clots, risks of death,and others. The probabilities of surgical success and failure discussed with patient given their particular co-morbidities.The time and nature of expected rehabilitation and recovery was discussed.The patient's questions were all answered preoperatively.  No barriers to understanding were noted. I  explained the natural history of the disease process and Rx rationale.  I explained to the patient what I considered to be reasonable expectations given their personal situation.  The final treatment plan was arrived at through a shared patient decision making process model.       I personally saw and evaluated the patient, and participated in the management and treatment plan.   Elspeth Parker, MD Attending Physician, Orthopedic Surgery   This document was dictated using Dragon voice recognition software. A reasonable attempt at proof reading has been made to minimize errors.

## 2023-12-07 NOTE — Op Note (Signed)
 Date of Surgery: 12/07/2023  INDICATIONS: [name] is a 78 y.o.-year-old female with left medial meniscal tear with extrusion.  The risk and benefits of the procedure were discussed in detail and documented in the pre-operative evaluation.   PREOPERATIVE DIAGNOSIS: 1. Left knee medial meniscal tear  POSTOPERATIVE DIAGNOSIS: Same.  PROCEDURE: 1. Left knee medial meniscal repair  SURGEON: Elspeth LITTIE Parker MD  ASSISTANT: Conley Dawson, ATC  ANESTHESIA:  general  IV FLUIDS AND URINE: See anesthesia record.  ANTIBIOTICS: Ancef  ESTIMATED BLOOD LOSS: 10 mL.  IMPLANTS:  Implant Name Type Inv. Item Serial No. Manufacturer Lot No. LRB No. Used Action  ANCH JGGRKNOTLESS OC 1.5 MB - [id number] Anchor ANCH JGGRKNOTLESS OC 1.5 MB  [employer](ORTH,TRAU,BIO,SG) [id number] Left 1 Implanted    DRAINS: None  CULTURES: None  COMPLICATIONS: none  DESCRIPTION OF PROCEDURE:   Examination under anesthesia: A careful examination under anesthesia was performed.  Knee ROM motion was: -3 - 135 Lachman: Normal Pivot Shift: Normal Posterior drawer: normal.   Varus stability in full extension: normal.   Varus stability in 30 degrees of flexion: normal.  Valgus stability in full extension: normal.   Valgus stability in 30 degrees of flexion: normal.  Posterolateral drawer: normal   Intra-operative findings: A thorough arthroscopic examination of the knee was performed.  The findings are: 1. Suprapatellar pouch: Normal 2. Undersurface of median ridge: Normal 3. Medial patellar facet: Normal 4. Lateral patellar facet: Normal 5. Trochlea: Normal 6. Lateral gutter/popliteus tendon: Normal 7. Hoffa's fat pad: Normal 8. Medial gutter/plica: Normal 9. ACL: Normal 10. PCL: Normal 11. Medial meniscus: Horizontal mid body tear with extrusion 12. Medial compartment cartilage: Grade 2 cartilage changes, femur/tibia 13. Lateral meniscus: Normal 14. Lateral compartment cartilage:  Normal   I identified the patient in the pre-operative holding area.  I marked the operative knee with my initials. I reviewed the risks and benefits of the proposed surgical intervention and the patient wished to proceed.  Anesthesia performed a peripheral nerve block.  Patient was subsequently taken back to the operating room.  The patient was transferred to the operative suite and placed in the supine position with all bony prominences padded.     SCDs were placed on the non-operative lower extremity. Appropriate antibiotics was administered within 1 hour before incision. The operative lower extremity was then prepped and draped in standard fashion. A time out was performed confirming the correct extremity, correct patient and correct procedure.    A standard anterolateral portal was made with an 11 blade.  The ideal position for the anteromedial portal was established using a spinal needle.  This AM portal was then created under direct visualization with an 11 blade.  A full diagnostic arthroscopy was then performed, as described above, including probing of the chondral and meniscal surfaces.     At this time the decision was made to perform a meniscal repair/centralization based on the profile of the tear.  The portal was extended proximally.  The drill guide was introduced in the proximal portion of the portal and was drilled just next to the inner portion of the meniscus.  A universal lasso was introduced and wrapped around the meniscocapsular junction.  This was used to feed the passing suture around the meniscocapsular junction.  These were retrieved from the inferior portion of the portal and fed into the knotless mechanism.  This was subsequently tensioned with excellent restoration of the meniscus into the joint.    That concluded the case.  Skin was closed with 2-0 Vicryl and 3-0 nylon. Xeroform gauze, gauze, Tegaderm, Iceman and brace were applied.  Instrument, sponge, and needle counts  were correct prior to wound closure and at the conclusion of the case.  The patient was taken to the PACU without complication    POSTOPERATIVE PLAN: She will be weight bearing as tolerated. She will be placed on aspirin  for blood clot prevention. She will begin PT immediately postop.   Elspeth LITTIE Parker, MD 4:06 PM

## 2023-12-07 NOTE — Brief Op Note (Signed)
   Brief Op Note  Date of Surgery: 12/07/2023  Preoperative Diagnosis: LEFT KNEE MEDIAL MENSICUS TEAR  Postoperative Diagnosis: same  Procedure: Procedure(s): ARTHROSCOPY, KNEE, WITH MENISCUS REPAIR  Implants: Implant Name Type Inv. Item Serial No. Manufacturer Lot No. LRB No. Used Action  ANCH JGGRKNOTLESS OC 1.5 MB - [id number] Anchor ANCH JGGRKNOTLESS OC 1.5 MB  [employer]ON(ORTH,TRAU,BIO,SG) [id number] Left 1 Implanted    Surgeons: Surgeon(s): Genelle Standing, MD  Anesthesia: General    Estimated Blood Loss: See anesthesia record  Complications: None  Condition to PACU: Stable  Standing LITTIE Genelle, MD 12/07/2023 4:06 PM

## 2023-12-07 NOTE — Anesthesia Postprocedure Evaluation (Signed)
 Anesthesia Post Note  Patient: Dana Daniels  Procedure(s) Performed: ARTHROSCOPY, KNEE, WITH MENISCUS REPAIR (Left: Knee)     Patient location during evaluation: PACU Anesthesia Type: General Level of consciousness: awake and alert Pain management: pain level controlled Vital Signs Assessment: post-procedure vital signs reviewed and stable Respiratory status: spontaneous breathing, nonlabored ventilation and respiratory function stable Cardiovascular status: blood pressure returned to baseline and stable Postop Assessment: no apparent nausea or vomiting Anesthetic complications: no   No notable events documented.  Last Vitals:  Vitals:   12/07/23 1618 12/07/23 1630  BP: (!) 141/65 129/65  Pulse: 89 74  Resp: 18 20  Temp: (!) 36.3 C   SpO2: 96% 97%    Last Pain:  Vitals:   12/07/23 1618  TempSrc:   PainSc: Asleep                 Butler Levander Pinal

## 2023-12-07 NOTE — Anesthesia Procedure Notes (Signed)
 Procedure Name: LMA Insertion Date/Time: 12/07/2023 3:28 PM  Performed by: Denton Niels CROME, CRNAPre-anesthesia Checklist: Patient identified, Emergency Drugs available, Suction available, Patient being monitored and Timeout performed Patient Re-evaluated:Patient Re-evaluated prior to induction Oxygen Delivery Method: Circle system utilized Preoxygenation: Pre-oxygenation with 100% oxygen Induction Type: IV induction Ventilation: Mask ventilation without difficulty LMA: LMA with gastric port inserted LMA Size: 4.0 Number of attempts: 1 Placement Confirmation: positive ETCO2 Dental Injury: Teeth and Oropharynx as per pre-operative assessment

## 2023-12-08 ENCOUNTER — Telehealth: Payer: Self-pay | Admitting: Orthopaedic Surgery

## 2023-12-08 ENCOUNTER — Encounter (HOSPITAL_BASED_OUTPATIENT_CLINIC_OR_DEPARTMENT_OTHER): Payer: Self-pay | Admitting: Orthopaedic Surgery

## 2023-12-08 ENCOUNTER — Other Ambulatory Visit: Payer: Self-pay | Admitting: Orthopaedic Surgery

## 2023-12-08 MED ORDER — OXYCODONE HCL 5 MG PO TABS: 5.0000 mg | ORAL_TABLET | ORAL | 0 refills | Status: AC | PRN

## 2023-12-08 MED ORDER — ACETAMINOPHEN 500 MG PO TABS: 500.0000 mg | ORAL_TABLET | Freq: Three times a day (TID) | ORAL | 0 refills | Status: AC

## 2023-12-08 NOTE — Telephone Encounter (Signed)
 Patient daughter states she is in a lot of pain and she wants to know if she can give her something for pain  [phone]

## 2023-12-14 ENCOUNTER — Other Ambulatory Visit: Payer: Self-pay

## 2023-12-14 ENCOUNTER — Ambulatory Visit (HOSPITAL_BASED_OUTPATIENT_CLINIC_OR_DEPARTMENT_OTHER): Attending: Orthopaedic Surgery | Admitting: Physical Therapy

## 2023-12-14 ENCOUNTER — Encounter (HOSPITAL_BASED_OUTPATIENT_CLINIC_OR_DEPARTMENT_OTHER): Payer: Self-pay | Admitting: Physical Therapy

## 2023-12-14 DIAGNOSIS — S83242A Other tear of medial meniscus, current injury, left knee, initial encounter: Secondary | ICD-10-CM | POA: Insufficient documentation

## 2023-12-14 DIAGNOSIS — R293 Abnormal posture: Secondary | ICD-10-CM | POA: Diagnosis present

## 2023-12-14 DIAGNOSIS — M6281 Muscle weakness (generalized): Secondary | ICD-10-CM | POA: Diagnosis present

## 2023-12-14 DIAGNOSIS — G8929 Other chronic pain: Secondary | ICD-10-CM | POA: Insufficient documentation

## 2023-12-14 DIAGNOSIS — R262 Difficulty in walking, not elsewhere classified: Secondary | ICD-10-CM | POA: Diagnosis present

## 2023-12-14 DIAGNOSIS — M25562 Pain in left knee: Secondary | ICD-10-CM | POA: Insufficient documentation

## 2023-12-14 DIAGNOSIS — R2681 Unsteadiness on feet: Secondary | ICD-10-CM | POA: Diagnosis present

## 2023-12-14 NOTE — Therapy (Signed)
 OUTPATIENT PHYSICAL THERAPY EVALUATION   Patient Name: Dana Daniels MRN: [id number] DOB:May 01, 1945, 78 y.o., female Today's Date: 12/15/2023  END OF SESSION:  PT End of Session - 12/14/23 1454     Visit Number 1    Date for Recertification  02/27/24    PT Start Time 1453    PT Stop Time 1523    PT Time Calculation (min) 30 min    Activity Tolerance Patient tolerated treatment well    Behavior During Therapy WFL for tasks assessed/performed          Past Medical History:  Diagnosis Date   Allergy    Arthritis    Asthma    Blood transfusion without reported diagnosis    Cataract    Hyperlipidemia    Hypertension    Migraine    Osteoporosis    Sleep apnea    Past Surgical History:  Procedure Laterality Date   ABDOMINAL HYSTERECTOMY     CHOLECYSTECTOMY     EYE SURGERY Left    cataract    FOOT SURGERY Left    KNEE ARTHROSCOPY WITH MENISCAL REPAIR Left 12/07/2023   Procedure: ARTHROSCOPY, KNEE, WITH MENISCUS REPAIR;  Surgeon: Genelle Standing, MD;  Location: [facility];  Service: Orthopedics;  Laterality: Left;  LEFT KNEE ARTHROSCOPY WITH MENISCAL ROOT REPAIR   Patient Active Problem List   Diagnosis Date Noted   Acute medial meniscus tear of left knee 12/07/2023   Conductive hearing loss of right ear with unrestricted hearing of left ear 11/23/2023   Chronic insomnia 09/30/2022   Bilateral foot pain 08/05/2021   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   Seasonal allergies 03/31/2018   History of gastroesophageal reflux (GERD) 09/25/2017   Osteoarthritis of multiple joints 03/24/2017   Chronic pain of left knee 03/24/2017   Chronic obstructive pulmonary disease (HCC) 03/24/2017   Primary cancer of skin of left foot 02/03/2017   Mild intermittent asthma without complication 02/03/2017   Obstructive sleep apnea 02/03/2017   Dyslipidemia 02/03/2017   Migraine 02/03/2017   Essential hypertension 09/18/2008      REFERRING PROVIDER:  Genelle Standing, MD      REFERRING DIAG:  S83.242A (ICD-10-CM) - Acute medial meniscus tear of left knee, initial encounter    s/p PROCEDURE: 1. Left knee medial meniscal repair   Rationale for Evaluation and Treatment: Rehabilitation  THERAPY DIAG:  Abnormal posture  Difficulty in walking, not elsewhere classified  Muscle weakness (generalized)  ONSET DATE: DOS 12/07/23   SUBJECTIVE:  SUBJECTIVE STATEMENT: It is feeling okay. Reports initial injury happened a year ago when she stepped down a step and heard a pop. Did PT preop for a while and was good about doing her exercises.   PERTINENT HISTORY:  OA  PAIN:  Are you having pain? Yes: NPRS scale: 3 Pain location: Lt knee Pain description: throbbing Aggravating factors: WB Relieving factors: ice  PRECAUTIONS:  None  RED FLAGS: None   WEIGHT BEARING RESTRICTIONS:  WBAT  FALLS:  Has patient fallen in last 6 months? No   OCCUPATION:  Not working  PLOF:  Independent  PATIENT GOALS:  Walk without AD, navigate stairs     OBJECTIVE:  Note: Objective measures were completed at Evaluation unless otherwise noted.   PATIENT SURVEYS:  LEFS  Extreme difficulty/unable (0), Quite a bit of difficulty (1), Moderate difficulty (2), Little difficulty (3), No difficulty (4) Survey date:    Any of your usual work, housework or school activities   2. Usual hobbies, recreational or sporting activities   3. Getting into/out of the bath 3  4. Walking between rooms 2  5. Putting on socks/shoes   6. Squatting    7. Lifting an object, like a bag of groceries from the floor   8. Performing light activities around your home   9. Performing heavy activities around your home   10. Getting into/out of a car 2   11. Walking 2 blocks   12. Walking 1 mile   13. Going up/down 10 stairs (1 flight)   14. Standing for 1 hour   15.  sitting for 1 hour 2  16. Running on even ground   17. Running on uneven ground   18. Making sharp turns while running fast   19. Hopping    20. Rolling over in bed 2  Score total:  11     COGNITIVE STATUS: Within functional limits for tasks assessed   SENSATION: WFL  EDEMA:  Yes: mild as expected post op   GAIT: Comments: EVAL  arrived with rollator and brace locked in extension   Body Part #1 Knee  PALPATION: EVAL: no s/s of infection, good patellar mobility  LOWER EXTREMITY ROM:     Passive  Left eval  Knee flexion 50  Knee extension 0  Ankle dorsiflexion   Ankle plantarflexion   Ankle inversion   Ankle eversion    (Blank rows = not tested)   TREATMENT DATE:   EVAL See HEP Changed bandages Gait training for quad set at heel strike & upright posture   PATIENT EDUCATION:  Education details: Anatomy of condition, POC, HEP, exercise form/rationale Person educated: Patient and Child(ren) Education method: Explanation, Demonstration, Tactile cues, Verbal cues, and Handouts Education comprehension: verbalized understanding, returned demonstration, verbal cues required, tactile cues required, and needs further education   HOME EXERCISE PROGRAM: Access Code: [email or web address] URL: [email or web address] Date: 12/14/2023 Prepared by: Harlene Cordon  Exercises - Supine Quad Set  - 3-4 x daily - 7 x weekly - 1 sets - 10 reps - 5s hold - Active Straight Leg Raise with Quad Set  - 3-4 x daily - 7 x weekly - 2 sets - 10 reps - Sidelying Hip Abduction  - 3-4 x daily - 7 x weekly - 3 sets - 10 reps - Supine Heel Slide  - 3-4 x daily - 7 x weekly - 1 sets - 10 reps - 3 breaths hold   ASSESSMENT:  CLINICAL IMPRESSION: Patient is a 78 y.o. F  who was seen today for physical therapy evaluation and treatment for s/p Lt medial meniscus  repair.      REHAB POTENTIAL: Good  CLINICAL DECISION MAKING: Stable/uncomplicated  EVALUATION COMPLEXITY: Low   GOALS: Goals reviewed with patient? Yes  SHORT TERM GOALS: Target date: 01/12/2024  Ambulation without AD, proper form Baseline: Goal status: INITIAL  2.  SLR without quad lag Baseline:  Goal status: INITIAL  3.  Proper control of TKE from mini squat Baseline:  Goal status: INITIAL   LONG TERM GOALS: Target date: POC date  Navigate stairs reciprocally without increased pain >2/10  Baseline:  Goal status: INITIAL  2.  Proximal control through lunges without increase in pain (week 12) Baseline:  Goal status: INITIAL  3.  Single leg balance with head and UE motion Baseline:  Goal status: INITIAL  4.  Average pain with ADLs <=2/10 Baseline:  Goal status: INITIAL  5.  Quad strength, via hand held dyno testing, 85% of non-operative leg Baseline:  Goal status: INITIAL     PLAN:  PT FREQUENCY: 1-2x/week  PT DURATION: POC date  PLANNED INTERVENTIONS: 97164- PT Re-evaluation, 97750- Physical Performance Testing, 97110-Therapeutic exercises, 97530- Therapeutic activity, [id number]- Neuromuscular re-education, 97535- Self Care, 02859- Manual therapy, [id number]- Gait training, [phone]- Aquatic Therapy, [phone] (1-2 muscles), [id number] (3+ muscles)- Dry Needling, Patient/Family education, Balance training, Stair training, Taping, Joint mobilization, Spinal mobilization, Scar mobilization, and Cryotherapy.  PLAN FOR NEXT SESSION: progress gait training, unlock brace if able to demo SLR x10 without lag   Harlene Cordon, PT, DPT 12/15/2023, 1:03 PM

## 2023-12-18 ENCOUNTER — Ambulatory Visit (INDEPENDENT_AMBULATORY_CARE_PROVIDER_SITE_OTHER): Admitting: Student

## 2023-12-18 DIAGNOSIS — S83242A Other tear of medial meniscus, current injury, left knee, initial encounter: Secondary | ICD-10-CM

## 2023-12-18 NOTE — Progress Notes (Signed)
 Post Operative Evaluation    Procedure/Date of Surgery: Left knee medial meniscus repair with centralization 12/07/2023  Interval History:   Patient presents today 2 weeks status post the above procedure.  Overall she reports doing very well and is not having any pain without consistent use of any type of pain medication.  She has began working with physical therapy and is compliant with her home exercise program.  She is wearing the brace in a locked position and ambulating with use of a walker.   PMH/PSH/Family History/Social History/Meds/Allergies:    Past Medical History:  Diagnosis Date   Allergy    Arthritis    Asthma    Blood transfusion without reported diagnosis    Cataract    Hyperlipidemia    Hypertension    Migraine    Osteoporosis    Sleep apnea    Past Surgical History:  Procedure Laterality Date   ABDOMINAL HYSTERECTOMY     CHOLECYSTECTOMY     EYE SURGERY Left    cataract    FOOT SURGERY Left    KNEE ARTHROSCOPY WITH MENISCAL REPAIR Left 12/07/2023   Procedure: ARTHROSCOPY, KNEE, WITH MENISCUS REPAIR;  Surgeon: Genelle Standing, MD;  Location: [facility];  Service: Orthopedics;  Laterality: Left;  LEFT KNEE ARTHROSCOPY WITH MENISCAL ROOT REPAIR   Social History   Socioeconomic History   Marital status: Widowed    Spouse name: Not on file   Number of children: Not on file   Years of education: Not on file   Highest education level: Associate degree: occupational, Scientist, product/process development, or vocational program  Occupational History   Not on file  Tobacco Use   Smoking status: Never   Smokeless tobacco: Never  Vaping Use   Vaping status: Never Used  Substance and Sexual Activity   Alcohol use: No   Drug use: Never   Sexual activity: Not on file  Other Topics Concern   Not on file  Social History Narrative   Not on file   Social Drivers of Health   Financial Resource Strain: Patient Declined (05/26/2023)    Overall Financial Resource Strain (CARDIA)    Difficulty of Paying Living Expenses: Patient declined  Food Insecurity: Patient Declined (05/26/2023)   Hunger Vital Sign    Worried About Running Out of Food in the Last Year: Patient declined    Ran Out of Food in the Last Year: Patient declined  Transportation Needs: Patient Declined (05/26/2023)   PRAPARE - Administrator, Civil Service (Medical): Patient declined    Lack of Transportation (Non-Medical): Patient declined  Physical Activity: Insufficiently Active (05/26/2023)   Exercise Vital Sign    Days of Exercise per Week: 3 days    Minutes of Exercise per Session: 20 min  Stress: No Stress Concern Present (05/26/2023)   [employer] of Occupational Health - Occupational Stress Questionnaire    Feeling of Stress : Not at all  Social Connections: Unknown (05/26/2023)   Social Connection and Isolation Panel    Frequency of Communication with Friends and Family: More than three times a week    Frequency of Social Gatherings with Friends and Family: Twice a week    Attends Religious Services: Patient declined    Database administrator or Organizations: Patient declined    Attends Club or  Organization Meetings: Patient declined    Marital Status: Widowed   Family History  Problem Relation Age of Onset   Heart disease Mother    Hyperlipidemia Mother    Hypertension Mother    Stroke Mother    Heart disease Father    Stroke Father    Diabetes Daughter    Hypertension Daughter    Hypertension Son    Hyperlipidemia Son    Fibromyalgia Son    Lupus Son    Sleep apnea Son    Hypertension Son    Sleep apnea Son    Allergies  Allergen Reactions   Tramadol Itching   Current Outpatient Medications  Medication Sig Dispense Refill   acetaminophen  (TYLENOL ) 500 MG tablet Take 1 tablet (500 mg total) by mouth every 8 (eight) hours for 10 days. 30 tablet 0   albuterol  (VENTOLIN  HFA) 108 (90 Base) MCG/ACT inhaler INHALE 1  TO 2 PUFFS BY MOUTH EVERY 4 HOURS AS NEEDED FOR WHEEZING FOR SHORTNESS OF BREATH 9 g 0   aspirin  EC 325 MG tablet Take 1 tablet (325 mg total) by mouth daily. 14 tablet 0   azithromycin  (ZITHROMAX ) 250 MG tablet Sig as indicated 6 tablet 0   benzonatate  (TESSALON ) 200 MG capsule Take 1 capsule (200 mg total) by mouth 2 (two) times daily as needed for cough. 20 capsule 0   cetirizine  (ZYRTEC ) 10 MG tablet Take 1 tablet (10 mg total) by mouth daily. 10 tablet 11   cromolyn  (NASALCROM ) 5.2 MG/ACT nasal spray Place 1 spray into both nostrils 2 (two) times daily. 26 mL 12   diltiazem  (TIAZAC ) 240 MG 24 hr capsule Take 1 capsule by mouth once daily 90 capsule 0   Fluticasone -Umeclidin-Vilant (TRELEGY ELLIPTA ) 100-62.5-25 MCG/ACT AEPB Inhale 1 puff by mouth once daily 60 each 3   HYDROcodone  bit-homatropine (HYCODAN) 5-1.5 MG/5ML syrup Take 5 mLs by mouth at bedtime as needed for cough. 120 mL 0   montelukast  (SINGULAIR ) 10 MG tablet Take 1 tablet (10 mg total) by mouth at bedtime. 90 tablet 3   nitroGLYCERIN  (NITROSTAT ) 0.4 MG SL tablet Place 1 tablet (0.4 mg total) under the tongue every 5 (five) minutes as needed for chest pain. 25 tablet 0   omeprazole  (PRILOSEC) 40 MG capsule Take 1 capsule (40 mg total) by mouth daily. 90 capsule 3   oxyCODONE  (ROXICODONE ) 5 MG immediate release tablet Take 1 tablet (5 mg total) by mouth every 4 (four) hours as needed for severe pain (pain score 7-10) or breakthrough pain. 10 tablet 0   simvastatin  (ZOCOR ) 20 MG tablet Take 1 tablet (20 mg total) by mouth daily. 90 tablet 3   Ubrogepant  (UBRELVY ) 100 MG TABS Take 1 tablet (100 mg total) by mouth daily as needed. Take at onset of migraine headache 15 tablet 3   zolpidem  (AMBIEN ) 5 MG tablet Take 1 tablet (5 mg total) by mouth at bedtime as needed for sleep. 15 tablet 1   No current facility-administered medications for this visit.   No results found.  Review of Systems:   A ROS was performed including pertinent  positives and negatives as documented in the HPI.   Musculoskeletal Exam:    There were no vitals taken for this visit.  Left knee incisions are well-appearing without erythema or drainage.  Mild residual swelling within the knee without ecchymosis.  Knee flexion to 60 degrees today.  Patient is ambulating well with assistance of a walker.  Imaging:     Assessment:  Patient is 2 weeks status post left knee medial meniscal repair with centralization overall doing very well.  She will continue to work with physical therapy on range of motion and quadriceps strengthening.  Discussed that she can return to her second floor apartment and we went over strategies for ascending and descending stairs.  Will plan to see her back in another 4 weeks for next follow-up.  Plan :    - Return to clinic in 4 weeks for next postop      I personally saw and evaluated the patient, and participated in the management and treatment plan.  Leonce Reveal, PA-C Orthopedics

## 2023-12-22 ENCOUNTER — Other Ambulatory Visit: Payer: Self-pay | Admitting: Emergency Medicine

## 2023-12-22 DIAGNOSIS — J449 Chronic obstructive pulmonary disease, unspecified: Secondary | ICD-10-CM

## 2023-12-23 DIAGNOSIS — G4733 Obstructive sleep apnea (adult) (pediatric): Secondary | ICD-10-CM | POA: Diagnosis not present

## 2023-12-24 ENCOUNTER — Encounter (INDEPENDENT_AMBULATORY_CARE_PROVIDER_SITE_OTHER): Payer: Self-pay

## 2023-12-24 ENCOUNTER — Ambulatory Visit (HOSPITAL_BASED_OUTPATIENT_CLINIC_OR_DEPARTMENT_OTHER): Admitting: Physical Therapy

## 2023-12-24 DIAGNOSIS — M6281 Muscle weakness (generalized): Secondary | ICD-10-CM

## 2023-12-24 DIAGNOSIS — R293 Abnormal posture: Secondary | ICD-10-CM | POA: Diagnosis not present

## 2023-12-24 DIAGNOSIS — R262 Difficulty in walking, not elsewhere classified: Secondary | ICD-10-CM

## 2023-12-24 NOTE — Therapy (Signed)
 OUTPATIENT PHYSICAL THERAPY TREATMENT   Patient Name: Dana Daniels MRN: [id number] DOB:07/25/1945, 78 y.o., female Today's Date: 12/24/2023  END OF SESSION:  PT End of Session - 12/24/23 1427     Visit Number 2    Date for Recertification  02/27/24    PT Start Time 1233    PT Stop Time 1311    PT Time Calculation (min) 38 min    Activity Tolerance Patient tolerated treatment well    Behavior During Therapy WFL for tasks assessed/performed           Past Medical History:  Diagnosis Date   Allergy    Arthritis    Asthma    Blood transfusion without reported diagnosis    Cataract    Hyperlipidemia    Hypertension    Migraine    Osteoporosis    Sleep apnea    Past Surgical History:  Procedure Laterality Date   ABDOMINAL HYSTERECTOMY     CHOLECYSTECTOMY     EYE SURGERY Left    cataract    FOOT SURGERY Left    KNEE ARTHROSCOPY WITH MENISCAL REPAIR Left 12/07/2023   Procedure: ARTHROSCOPY, KNEE, WITH MENISCUS REPAIR;  Surgeon: Genelle Standing, MD;  Location: [facility];  Service: Orthopedics;  Laterality: Left;  LEFT KNEE ARTHROSCOPY WITH MENISCAL ROOT REPAIR   Patient Active Problem List   Diagnosis Date Noted   Acute medial meniscus tear of left knee 12/07/2023   Conductive hearing loss of right ear with unrestricted hearing of left ear 11/23/2023   Chronic insomnia 09/30/2022   Bilateral foot pain 08/05/2021   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   Seasonal allergies 03/31/2018   History of gastroesophageal reflux (GERD) 09/25/2017   Osteoarthritis of multiple joints 03/24/2017   Chronic pain of left knee 03/24/2017   Chronic obstructive pulmonary disease (HCC) 03/24/2017   Primary cancer of skin of left foot 02/03/2017   Mild intermittent asthma without complication 02/03/2017   Obstructive sleep apnea 02/03/2017   Dyslipidemia 02/03/2017   Migraine 02/03/2017   Essential hypertension 09/18/2008      REFERRING PROVIDER:  Genelle Standing, MD      REFERRING DIAG:  S83.242A (ICD-10-CM) - Acute medial meniscus tear of left knee, initial encounter    s/p PROCEDURE: 1. Left knee medial meniscal repair   Rationale for Evaluation and Treatment: Rehabilitation  THERAPY DIAG:  Abnormal posture  Difficulty in walking, not elsewhere classified  Muscle weakness (generalized)  ONSET DATE: DOS 12/07/23   SUBJECTIVE:  SUBJECTIVE STATEMENT: Pt reports that she has been wearing brace 24/7 except with bathing and doing exercises.  She reports that her pain is much better than prior to surgery.  She is bothered by the brace as when is slides down it puts pressure on knee.  She is ambulating with RW.   Interpreter, Alm, is present to translate during session.   PERTINENT HISTORY:  OA  PAIN:  Are you having pain? Yes: NPRS scale: 2/10 Pain location: Lt knee Pain description: constant Aggravating factors: when brace slides down and hits knee Relieving factors: ice  PRECAUTIONS:  None  RED FLAGS: None   WEIGHT BEARING RESTRICTIONS:  WBAT  FALLS:  Has patient fallen in last 6 months? No   OCCUPATION:  Not working  PLOF:  Independent  PATIENT GOALS:  Walk without AD, navigate stairs     OBJECTIVE:  Note: Objective measures were completed at Evaluation unless otherwise noted.   PATIENT SURVEYS:  LEFS  Extreme difficulty/unable (0), Quite a bit of difficulty (1), Moderate difficulty (2), Little difficulty (3), No difficulty (4) Survey date:    Any of your usual work, housework or school activities   2. Usual hobbies, recreational or sporting activities   3. Getting into/out of the bath 3  4. Walking between rooms 2  5. Putting on socks/shoes   6. Squatting    7. Lifting an  object, like a bag of groceries from the floor   8. Performing light activities around your home   9. Performing heavy activities around your home   10. Getting into/out of a car 2  11. Walking 2 blocks   12. Walking 1 mile   13. Going up/down 10 stairs (1 flight)   14. Standing for 1 hour   15.  sitting for 1 hour 2  16. Running on even ground   17. Running on uneven ground   18. Making sharp turns while running fast   19. Hopping    20. Rolling over in bed 2  Score total:  11     COGNITIVE STATUS: Within functional limits for tasks assessed   SENSATION: WFL  EDEMA:  Yes: mild as expected post op   GAIT: Comments: EVAL  arrived with rollator and brace locked in extension   Body Part #1 Knee  PALPATION: EVAL: no s/s of infection, good patellar mobility  LOWER EXTREMITY ROM:     Passive  Left eval Left 12/24/23 AAROM  Knee flexion 50 63  Knee extension 0 0  Ankle dorsiflexion    Ankle plantarflexion    Ankle inversion    Ankle eversion     (Blank rows = not tested)   TREATMENT DATE:  Self care: pt instructed in massage around incisions to assist with desensitization; pt returned demo.  Incisions clean/dry with no signs or symptoms of infection (no dressing present).  Discussed with pt and her daughter how to adjust brace.  Therapeutic exercise:   Lt Quad set x 5 sec x 5 LLE SLR with 4 sec descent to starting position x 10 LLE heel slide x 2 AROM-> with strap assist x 10, with 10 sec hold (range to tolerance) LLE SAQ in comfortable range x 10  Manual therapy:  Lt patellar mobs;  light effleurage to Lt lower leg to assist in edema reduction  PATIENT EDUCATION:  Education details: HEP, exercise form/rationale Person educated: Patient and Child(ren) Education method: Explanation, Demonstration, Tactile cues, Verbal cues Education comprehension: verbalized understanding, returned demonstration, verbal cues  required, tactile cues required, and needs further  education   HOME EXERCISE PROGRAM: Access Code: [email or web address] URL: [email or web address] Date: 12/14/2023 Prepared by: Harlene Cordon  Exercises - Supine Quad Set  - 3-4 x daily - 7 x weekly - 1 sets - 10 reps - 5s hold - Active Straight Leg Raise with Quad Set  - 3-4 x daily - 7 x weekly - 2 sets - 10 reps - Sidelying Hip Abduction  - 3-4 x daily - 7 x weekly - 3 sets - 10 reps - Supine Heel Slide  - 3-4 x daily - 7 x weekly - 1 sets - 10 reps - 3 breaths hold   ASSESSMENT:  CLINICAL IMPRESSION: Pt is 2wks and 3days s/p Lt medial meniscus repair.  Pt improved Lt knee flexion AAROM to 63deg with heel slide. Pt able to complete SLR without extensor lag. No increase in pain during session.  Goals are ongoing.    Initial eval:  Patient is a 78 y.o. F who was seen today for physical therapy evaluation and treatment for s/p Lt medial meniscus repair.      REHAB POTENTIAL: Good  CLINICAL DECISION MAKING: Stable/uncomplicated  EVALUATION COMPLEXITY: Low   GOALS: Goals reviewed with patient? Yes  SHORT TERM GOALS: Target date: 01/12/2024  Ambulation without AD, proper form Baseline: Goal status: INITIAL  2.  SLR without quad lag Baseline:  Goal status: INITIAL  3.  Proper control of TKE from mini squat Baseline:  Goal status: INITIAL   LONG TERM GOALS: Target date: POC date  Navigate stairs reciprocally without increased pain >2/10  Baseline:  Goal status: INITIAL  2.  Proximal control through lunges without increase in pain (week 12) Baseline:  Goal status: INITIAL  3.  Single leg balance with head and UE motion Baseline:  Goal status: INITIAL  4.  Average pain with ADLs <=2/10 Baseline:  Goal status: INITIAL  5.  Quad strength, via hand held dyno testing, 85% of non-operative leg Baseline:  Goal status: INITIAL     PLAN:  PT FREQUENCY: 1-2x/week  PT DURATION: POC date  PLANNED INTERVENTIONS: 97164- PT Re-evaluation, 97750-  Physical Performance Testing, 97110-Therapeutic exercises, 97530- Therapeutic activity, [id number]- Neuromuscular re-education, 97535- Self Care, 02859- Manual therapy, [id number]- Gait training, [phone]- Aquatic Therapy, [phone] (1-2 muscles), 20561 (3+ muscles)- Dry Needling, Patient/Family education, Balance training, Stair training, Taping, Joint mobilization, Spinal mobilization, Scar mobilization, and Cryotherapy.  PLAN FOR NEXT SESSION: progress gait training, unlock brace if able to demo SLR x10 without lag  Delon Aquas, PTA 12/24/23 2:41 PM [facility] [address] [place], [place], [address] Phone: [phone]   Fax:  [phone]

## 2023-12-30 ENCOUNTER — Encounter (HOSPITAL_BASED_OUTPATIENT_CLINIC_OR_DEPARTMENT_OTHER): Payer: Self-pay | Admitting: Physical Therapy

## 2023-12-30 ENCOUNTER — Ambulatory Visit (HOSPITAL_BASED_OUTPATIENT_CLINIC_OR_DEPARTMENT_OTHER): Admitting: Physical Therapy

## 2023-12-30 DIAGNOSIS — R293 Abnormal posture: Secondary | ICD-10-CM | POA: Diagnosis not present

## 2023-12-30 DIAGNOSIS — M6281 Muscle weakness (generalized): Secondary | ICD-10-CM

## 2023-12-30 DIAGNOSIS — G8929 Other chronic pain: Secondary | ICD-10-CM

## 2023-12-30 DIAGNOSIS — R2681 Unsteadiness on feet: Secondary | ICD-10-CM

## 2023-12-30 DIAGNOSIS — R262 Difficulty in walking, not elsewhere classified: Secondary | ICD-10-CM

## 2023-12-30 NOTE — Therapy (Signed)
 OUTPATIENT PHYSICAL THERAPY TREATMENT   Patient Name: Dana Daniels MRN: [id number] DOB:[birth year], 78 y.o., female Today's Date: 12/30/2023  END OF SESSION:  PT End of Session - 12/30/23 1242     Visit Number 3    Date for Recertification  02/27/24    PT Start Time 1100    PT Stop Time 1143    PT Time Calculation (min) 43 min    Activity Tolerance Patient tolerated treatment well    Behavior During Therapy WFL for tasks assessed/performed            Past Medical History:  Diagnosis Date   Allergy    Arthritis    Asthma    Blood transfusion without reported diagnosis    Cataract    Hyperlipidemia    Hypertension    Migraine    Osteoporosis    Sleep apnea    Past Surgical History:  Procedure Laterality Date   ABDOMINAL HYSTERECTOMY     CHOLECYSTECTOMY     EYE SURGERY Left    cataract    FOOT SURGERY Left    KNEE ARTHROSCOPY WITH MENISCAL REPAIR Left 12/07/2023   Procedure: ARTHROSCOPY, KNEE, WITH MENISCUS REPAIR;  Surgeon: Genelle Standing, MD;  [facility]:  [facility];  Service: Orthopedics;  Laterality: Left;  LEFT KNEE ARTHROSCOPY WITH MENISCAL ROOT REPAIR   Patient Active Problem List   Diagnosis Date Noted   Acute medial meniscus tear of left knee 12/07/2023   Conductive hearing loss of right ear with unrestricted hearing of left ear 11/23/2023   Chronic insomnia 09/30/2022   Bilateral foot pain 08/05/2021   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   Seasonal allergies 03/31/2018   History of gastroesophageal reflux (GERD) 09/25/2017   Osteoarthritis of multiple joints 03/24/2017   Chronic pain of left knee 03/24/2017   Chronic obstructive pulmonary disease (HCC) 03/24/2017   Primary cancer of skin of left foot 02/03/2017   Mild intermittent asthma without complication 02/03/2017   Obstructive sleep apnea 02/03/2017   Dyslipidemia 02/03/2017   Migraine 02/03/2017   Essential hypertension 09/18/2008      REFERRING PROVIDER:  Genelle Standing, MD      REFERRING DIAG:  S83.242A (ICD-10-CM) - Acute medial meniscus tear of left knee, initial encounter    s/p PROCEDURE: 1. Left knee medial meniscal repair   Rationale for Evaluation and Treatment: Rehabilitation  THERAPY DIAG:  Abnormal posture  Difficulty in walking, not elsewhere classified  Muscle weakness (generalized)  Chronic pain of left knee  Unsteadiness on feet  ONSET DATE: DOS 12/07/23   SUBJECTIVE:  SUBJECTIVE STATEMENT: Patient reports she has been doing pretty well lately.  She reports that sore but only minorly. Interpreter, Alm, is present to translate during session.   PERTINENT HISTORY:  OA  PAIN:  Are you having pain? Yes: NPRS scale: 2/10 Pain location: Lt knee Pain description: constant Aggravating factors: when brace slides down and hits knee Relieving factors: ice  PRECAUTIONS:  None  RED FLAGS: None   WEIGHT BEARING RESTRICTIONS:  WBAT  FALLS:  Has patient fallen in last 6 months? No   OCCUPATION:  Not working  PLOF:  Independent  PATIENT GOALS:  Walk without AD, navigate stairs     OBJECTIVE:  Note: Objective measures were completed at Evaluation unless otherwise noted.   PATIENT SURVEYS:  LEFS  Extreme difficulty/unable (0), Quite a bit of difficulty (1), Moderate difficulty (2), Little difficulty (3), No difficulty (4) Survey date:    Any of your usual work, housework or school activities   2. Usual hobbies, recreational or sporting activities   3. Getting into/out of the bath 3  4. Walking between rooms 2  5. Putting on socks/shoes   6. Squatting    7. Lifting an object, like a bag of groceries from the floor   8. Performing light activities around your home   9. Performing  heavy activities around your home   10. Getting into/out of a car 2  11. Walking 2 blocks   12. Walking 1 mile   13. Going up/down 10 stairs (1 flight)   14. Standing for 1 hour   15.  sitting for 1 hour 2  16. Running on even ground   17. Running on uneven ground   18. Making sharp turns while running fast   19. Hopping    20. Rolling over in bed 2  Score total:  11     COGNITIVE STATUS: Within functional limits for tasks assessed   SENSATION: WFL  EDEMA:  Yes: mild as expected post op   GAIT: Comments: EVAL  arrived with rollator and brace locked in extension   Body Part #1 Knee  PALPATION: EVAL: no s/s of infection, good patellar mobility  LOWER EXTREMITY ROM:     Passive  Left eval Left 12/24/23 AAROM Left   Knee flexion 50 63 80  Knee extension 0 0   Ankle dorsiflexion     Ankle plantarflexion     Ankle inversion     Ankle eversion      (Blank rows = not tested)   TREATMENT DATE:  10/29 Manual: STM to quad Edema massage   There-ex:  Quad set 2x10  Reviewed technique with heel slide  SLR 2x10  Patient could not tolerate bolster for SAQ today.  She tolerated the ball for SAQ 2x10   Neuromuscular reeducation:  Standing weight shifts 3 x 10 with explanation of benefit of weight shifts.  Self care: pt instructed in massage around incisions to assist with desensitization; pt returned demo.  Incisions clean/dry with no signs or symptoms of infection (no dressing present).  Discussed with pt and her daughter how to adjust brace.  Therapeutic exercise:   Lt Quad set x 5 sec x 5 LLE SLR with 4 sec descent to starting position x 10 LLE heel slide x 2 AROM-> with strap assist x 10, with 10 sec hold (range to tolerance) LLE SAQ in comfortable range x 10  Manual therapy:  Lt patellar mobs;  light effleurage to Lt lower leg to assist in edema  reduction  PATIENT EDUCATION:  Education details: HEP, exercise form/rationale Person educated: Patient and  Child(ren) Education method: Explanation, Demonstration, Tactile cues, Verbal cues Education comprehension: verbalized understanding, returned demonstration, verbal cues required, tactile cues required, and needs further education   HOME EXERCISE PROGRAM: Access Code: [email or web address] URL: [email or web address] Date: 12/14/2023 Prepared by: Harlene Cordon  Exercises - Supine Quad Set  - 3-4 x daily - 7 x weekly - 1 sets - 10 reps - 5s hold - Active Straight Leg Raise with Quad Set  - 3-4 x daily - 7 x weekly - 2 sets - 10 reps - Sidelying Hip Abduction  - 3-4 x daily - 7 x weekly - 3 sets - 10 reps - Supine Heel Slide  - 3-4 x daily - 7 x weekly - 1 sets - 10 reps - 3 breaths hold   ASSESSMENT:  CLINICAL IMPRESSION: The patient is nearly 3 weeks post-op. She is progressing well. She is having less pain walking. She has been working on her exercises at home. Her ROM improved today. She tolerated there-ex well. We worked on black & deck[employer] with heel slides. They have been givng her toruble at home.  She cannot tolerate the hard bolster under her leg.  Therapy added standing weight shifts.  Therapy will continue to progress as tolerated.  Initial eval:  Patient is a 78 y.o. F who was seen today for physical therapy evaluation and treatment for s/p Lt medial meniscus repair.      REHAB POTENTIAL: Good  CLINICAL DECISION MAKING: Stable/uncomplicated  EVALUATION COMPLEXITY: Low   GOALS: Goals reviewed with patient? Yes  SHORT TERM GOALS: Target date: 01/12/2024  Ambulation without AD, proper form Baseline: Goal status: INITIAL  2.  SLR without quad lag Baseline:  Goal status: INITIAL  3.  Proper control of TKE from mini squat Baseline:  Goal status: INITIAL   LONG TERM GOALS: Target date: POC date  Navigate stairs reciprocally without increased pain >2/10  Baseline:  Goal status: INITIAL  2.  Proximal control through lunges without increase in pain (week  12) Baseline:  Goal status: INITIAL  3.  Single leg balance with head and UE motion Baseline:  Goal status: INITIAL  4.  Average pain with ADLs <=2/10 Baseline:  Goal status: INITIAL  5.  Quad strength, via hand held dyno testing, 85% of non-operative leg Baseline:  Goal status: INITIAL     PLAN:  PT FREQUENCY: 1-2x/week  PT DURATION: POC date  PLANNED INTERVENTIONS: 97164- PT Re-evaluation, 97750- Physical Performance Testing, 97110-Therapeutic exercises, 97530- Therapeutic activity, [id number]- Neuromuscular re-education, 97535- Self Care, 02859- Manual therapy, [id number]- Gait training, [phone]- Aquatic Therapy, [phone] (1-2 muscles), 20561 (3+ muscles)- Dry Needling, Patient/Family education, Balance training, Stair training, Taping, Joint mobilization, Spinal mobilization, Scar mobilization, and Cryotherapy.  PLAN FOR NEXT SESSION: progress gait training, unlock brace if able to demo SLR x10 without lag  Delon Aquas, PTA 12/30/23 12:58 PM [facility] [address] [place], [place], [address] Phone: [phone]   Fax:  [phone]

## 2024-01-04 ENCOUNTER — Encounter: Payer: Self-pay | Admitting: Radiology

## 2024-01-06 ENCOUNTER — Ambulatory Visit (HOSPITAL_BASED_OUTPATIENT_CLINIC_OR_DEPARTMENT_OTHER): Admitting: Physical Therapy

## 2024-01-09 ENCOUNTER — Other Ambulatory Visit: Payer: Self-pay | Admitting: Emergency Medicine

## 2024-01-09 DIAGNOSIS — J449 Chronic obstructive pulmonary disease, unspecified: Secondary | ICD-10-CM

## 2024-01-12 ENCOUNTER — Encounter (HOSPITAL_BASED_OUTPATIENT_CLINIC_OR_DEPARTMENT_OTHER): Payer: Self-pay | Admitting: Physical Therapy

## 2024-01-12 ENCOUNTER — Ambulatory Visit (HOSPITAL_BASED_OUTPATIENT_CLINIC_OR_DEPARTMENT_OTHER): Attending: Orthopaedic Surgery | Admitting: Physical Therapy

## 2024-01-12 DIAGNOSIS — M6281 Muscle weakness (generalized): Secondary | ICD-10-CM | POA: Insufficient documentation

## 2024-01-12 DIAGNOSIS — G8929 Other chronic pain: Secondary | ICD-10-CM | POA: Diagnosis present

## 2024-01-12 DIAGNOSIS — M25562 Pain in left knee: Secondary | ICD-10-CM | POA: Diagnosis present

## 2024-01-12 DIAGNOSIS — R2681 Unsteadiness on feet: Secondary | ICD-10-CM | POA: Insufficient documentation

## 2024-01-12 DIAGNOSIS — R293 Abnormal posture: Secondary | ICD-10-CM | POA: Diagnosis present

## 2024-01-12 DIAGNOSIS — R262 Difficulty in walking, not elsewhere classified: Secondary | ICD-10-CM | POA: Insufficient documentation

## 2024-01-12 NOTE — Therapy (Signed)
 OUTPATIENT PHYSICAL THERAPY TREATMENT   Patient Name: Dana Daniels MRN: [id number] DOB:June 28, 1945, 78 y.o., female Today's Date: 01/13/2024  END OF SESSION:  PT End of Session - 01/12/24 1507     Visit Number 4    Date for Recertification  02/27/24    PT Start Time 1433    PT Stop Time 1512    PT Time Calculation (min) 39 min    Activity Tolerance Patient tolerated treatment well    Behavior During Therapy WFL for tasks assessed/performed             Past Medical History:  Diagnosis Date   Allergy    Arthritis    Asthma    Blood transfusion without reported diagnosis    Cataract    Hyperlipidemia    Hypertension    Migraine    Osteoporosis    Sleep apnea    Past Surgical History:  Procedure Laterality Date   ABDOMINAL HYSTERECTOMY     CHOLECYSTECTOMY     EYE SURGERY Left    cataract    FOOT SURGERY Left    KNEE ARTHROSCOPY WITH MENISCAL REPAIR Left 12/07/2023   Procedure: ARTHROSCOPY, KNEE, WITH MENISCUS REPAIR;  Surgeon: Genelle Standing, MD;  Location: [facility];  Service: Orthopedics;  Laterality: Left;  LEFT KNEE ARTHROSCOPY WITH MENISCAL ROOT REPAIR   Patient Active Problem List   Diagnosis Date Noted   Acute medial meniscus tear of left knee 12/07/2023   Conductive hearing loss of right ear with unrestricted hearing of left ear 11/23/2023   Chronic insomnia 09/30/2022   Bilateral foot pain 08/05/2021   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   Seasonal allergies 03/31/2018   History of gastroesophageal reflux (GERD) 09/25/2017   Osteoarthritis of multiple joints 03/24/2017   Chronic pain of left knee 03/24/2017   Chronic obstructive pulmonary disease (HCC) 03/24/2017   Primary cancer of skin of left foot 02/03/2017   Mild intermittent asthma without complication 02/03/2017   Obstructive sleep apnea 02/03/2017   Dyslipidemia 02/03/2017   Migraine 02/03/2017   Essential hypertension  09/18/2008     REFERRING PROVIDER:  Genelle Standing, MD      REFERRING DIAG:  S83.242A (ICD-10-CM) - Acute medial meniscus tear of left knee, initial encounter    s/p PROCEDURE: 1. Left knee medial meniscal repair   Rationale for Evaluation and Treatment: Rehabilitation  THERAPY DIAG:  Abnormal posture  Difficulty in walking, not elsewhere classified  Muscle weakness (generalized)  Chronic pain of left knee  Unsteadiness on feet  ONSET DATE: DOS 12/07/23   SUBJECTIVE:  SUBJECTIVE STATEMENT:  Interpreter present for session. The patient reports she is doing pretty good. She is a little sore from walking PERTINENT HISTORY:  OA  PAIN:  Are you having pain? Yes: NPRS scale: 2/10 Pain location: Lt knee Pain description: constant Aggravating factors: when brace slides down and hits knee Relieving factors: ice  PRECAUTIONS:  None  RED FLAGS: None   WEIGHT BEARING RESTRICTIONS:  WBAT  FALLS:  Has patient fallen in last 6 months? No   OCCUPATION:  Not working  PLOF:  Independent  PATIENT GOALS:  Walk without AD, navigate stairs     OBJECTIVE:  Note: Objective measures were completed at Evaluation unless otherwise noted.   PATIENT SURVEYS:  LEFS  Extreme difficulty/unable (0), Quite a bit of difficulty (1), Moderate difficulty (2), Little difficulty (3), No difficulty (4) Survey date:    Any of your usual work, housework or school activities   2. Usual hobbies, recreational or sporting activities   3. Getting into/out of the bath 3  4. Walking between rooms 2  5. Putting on socks/shoes   6. Squatting    7. Lifting an object, like a bag of groceries from the floor   8. Performing light activities around your home   9. Performing heavy activities around your home    10. Getting into/out of a car 2  11. Walking 2 blocks   12. Walking 1 mile   13. Going up/down 10 stairs (1 flight)   14. Standing for 1 hour   15.  sitting for 1 hour 2  16. Running on even ground   17. Running on uneven ground   18. Making sharp turns while running fast   19. Hopping    20. Rolling over in bed 2  Score total:  11     COGNITIVE STATUS: Within functional limits for tasks assessed   SENSATION: WFL  EDEMA:  Yes: mild as expected post op   GAIT: Comments: EVAL  arrived with rollator and brace locked in extension   Body Part #1 Knee  PALPATION: EVAL: no s/s of infection, good patellar mobility  LOWER EXTREMITY ROM:     Passive  Left eval Left 12/24/23 AAROM Left    Knee flexion 50 63 80   Knee extension 0 0    Ankle dorsiflexion      Ankle plantarflexion      Ankle inversion      Ankle eversion       (Blank rows = not tested)   TREATMENT DATE:  11/11 Manual: STM to quad Edema massage Extension stretch with distraction    There-ex:  Quad set 2x12 SLR 3x10 no extensor lag  Reviewed and updated HEP  Nu-step 5 min  Tried ex bike but was too tight Reviewed how to do for ROM but needed too much cuing   Neuro-re-ed:  Standing weight shift 3x10  Standing slow march 2x10 with cuing to use UE for stability.      10/29 Manual: STM to quad Edema massage   There-ex:  Quad set 2x10  Reviewed technique with heel slide  SLR 2x10  Patient could not tolerate bolster for SAQ today.  She tolerated the ball for SAQ 2x10   Neuromuscular reeducation:  Standing weight shifts 3 x 10 with explanation of benefit of weight shifts.  Self care: pt instructed in massage around incisions to assist with desensitization; pt returned demo.  Incisions clean/dry with no signs or symptoms of infection (no dressing present).  Discussed with pt and her daughter how to adjust brace.  Therapeutic exercise:   Lt Quad set x 5 sec x 5 LLE SLR with 4 sec  descent to starting position x 10 LLE heel slide x 2 AROM-> with strap assist x 10, with 10 sec hold (range to tolerance) LLE SAQ in comfortable range x 10  Manual therapy:  Lt patellar mobs;  light effleurage to Lt lower leg to assist in edema reduction  PATIENT EDUCATION:  Education details: HEP, exercise form/rationale Person educated: Patient and Child(ren) Education method: Explanation, Demonstration, Tactile cues, Verbal cues Education comprehension: verbalized understanding, returned demonstration, verbal cues required, tactile cues required, and needs further education   HOME EXERCISE PROGRAM: Access Code: [email or web address] URL: [email or web address] Date: 12/14/2023 Prepared by: Harlene Cordon  Exercises - Supine Quad Set  - 3-4 x daily - 7 x weekly - 1 sets - 10 reps - 5s hold - Active Straight Leg Raise with Quad Set  - 3-4 x daily - 7 x weekly - 2 sets - 10 reps - Sidelying Hip Abduction  - 3-4 x daily - 7 x weekly - 3 sets - 10 reps - Supine Heel Slide  - 3-4 x daily - 7 x weekly - 1 sets - 10 reps - 3 breaths hold   ASSESSMENT:  CLINICAL IMPRESSION: The patient is making good progress.s Her knee flexion ROM was measured over 95 degrees. We reviewed weight bearing exercises. She reports she could feel them but reported they felt good . We updated and reviewed HEP. We will continue to progress as tolerated.  Initial eval:  Patient is a 78 y.o. F who was seen today for physical therapy evaluation and treatment for s/p Lt medial meniscus repair.      REHAB POTENTIAL: Good  CLINICAL DECISION MAKING: Stable/uncomplicated  EVALUATION COMPLEXITY: Low   GOALS: Goals reviewed with patient? Yes  SHORT TERM GOALS: Target date: 01/12/2024  Ambulation without AD, proper form Baseline: Goal status: INITIAL  2.  SLR without quad lag Baseline:  Goal status: INITIAL  3.  Proper control of TKE from mini squat Baseline:  Goal status: INITIAL   LONG TERM  GOALS: Target date: POC date  Navigate stairs reciprocally without increased pain >2/10  Baseline:  Goal status: INITIAL  2.  Proximal control through lunges without increase in pain (week 12) Baseline:  Goal status: INITIAL  3.  Single leg balance with head and UE motion Baseline:  Goal status: INITIAL  4.  Average pain with ADLs <=2/10 Baseline:  Goal status: INITIAL  5.  Quad strength, via hand held dyno testing, 85% of non-operative leg Baseline:  Goal status: INITIAL     PLAN:  PT FREQUENCY: 1-2x/week  PT DURATION: POC date  PLANNED INTERVENTIONS: 97164- PT Re-evaluation, 97750- Physical Performance Testing, 97110-Therapeutic exercises, 97530- Therapeutic activity, [id number]- Neuromuscular re-education, 97535- Self Care, 02859- Manual therapy, [id number]- Gait training, [phone]- Aquatic Therapy, [phone] (1-2 muscles), 20561 (3+ muscles)- Dry Needling, Patient/Family education, Balance training, Stair training, Taping, Joint mobilization, Spinal mobilization, Scar mobilization, and Cryotherapy.  PLAN FOR NEXT SESSION: progress gait training, unlock brace if able to demo SLR x10 without lag  Delon Aquas, PTA 01/13/24 11:23 AM [facility] [address] [place], [place], [address] Phone: [phone]   Fax:  [phone]

## 2024-01-13 ENCOUNTER — Encounter (HOSPITAL_BASED_OUTPATIENT_CLINIC_OR_DEPARTMENT_OTHER): Payer: Self-pay | Admitting: Physical Therapy

## 2024-01-15 ENCOUNTER — Encounter (HOSPITAL_BASED_OUTPATIENT_CLINIC_OR_DEPARTMENT_OTHER): Payer: Self-pay | Admitting: Physical Therapy

## 2024-01-15 ENCOUNTER — Ambulatory Visit (HOSPITAL_BASED_OUTPATIENT_CLINIC_OR_DEPARTMENT_OTHER): Admitting: Physical Therapy

## 2024-01-15 DIAGNOSIS — R262 Difficulty in walking, not elsewhere classified: Secondary | ICD-10-CM

## 2024-01-15 DIAGNOSIS — R293 Abnormal posture: Secondary | ICD-10-CM | POA: Diagnosis not present

## 2024-01-15 DIAGNOSIS — R2681 Unsteadiness on feet: Secondary | ICD-10-CM

## 2024-01-15 DIAGNOSIS — M6281 Muscle weakness (generalized): Secondary | ICD-10-CM

## 2024-01-15 DIAGNOSIS — G8929 Other chronic pain: Secondary | ICD-10-CM

## 2024-01-15 NOTE — Therapy (Addendum)
 OUTPATIENT PHYSICAL THERAPY TREATMENT   Patient Name: Dana Daniels MRN: [id number] DOB:[birth year], 78 y.o., female Today's Date: 01/19/2024  END OF SESSION:  PT End of Session - 01/19/24 1438     Visit Number 5    Date for Recertification  02/27/24    PT Start Time 1145    PT Stop Time 1228    PT Time Calculation (min) 43 min    Activity Tolerance Patient tolerated treatment well    Behavior During Therapy WFL for tasks assessed/performed              Past Medical History:  Diagnosis Date   Allergy    Arthritis    Asthma    Blood transfusion without reported diagnosis    Cataract    Hyperlipidemia    Hypertension    Migraine    Osteoporosis    Sleep apnea    Past Surgical History:  Procedure Laterality Date   ABDOMINAL HYSTERECTOMY     CHOLECYSTECTOMY     EYE SURGERY Left    cataract    FOOT SURGERY Left    KNEE ARTHROSCOPY WITH MENISCAL REPAIR Left 12/07/2023   Procedure: ARTHROSCOPY, KNEE, WITH MENISCUS REPAIR;  Surgeon: Genelle Standing, MD;  Location: [facility];  Service: Orthopedics;  Laterality: Left;  LEFT KNEE ARTHROSCOPY WITH MENISCAL ROOT REPAIR   Patient Active Problem List   Diagnosis Date Noted   Acute medial meniscus tear of left knee 12/07/2023   Conductive hearing loss of right ear with unrestricted hearing of left ear 11/23/2023   Chronic insomnia 09/30/2022   Bilateral foot pain 08/05/2021   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   Seasonal allergies 03/31/2018   History of gastroesophageal reflux (GERD) 09/25/2017   Osteoarthritis of multiple joints 03/24/2017   Chronic pain of left knee 03/24/2017   Chronic obstructive pulmonary disease (HCC) 03/24/2017   Primary cancer of skin of left foot 02/03/2017   Mild intermittent asthma without complication 02/03/2017   Obstructive sleep apnea 02/03/2017   Dyslipidemia 02/03/2017   Migraine 02/03/2017   Essential hypertension  09/18/2008     REFERRING PROVIDER:  Genelle Standing, MD      REFERRING DIAG:  S83.242A (ICD-10-CM) - Acute medial meniscus tear of left knee, initial encounter    s/p PROCEDURE: 1. Left knee medial meniscal repair   Rationale for Evaluation and Treatment: Rehabilitation  THERAPY DIAG:  Difficulty in walking, not elsewhere classified  Muscle weakness (generalized)  Chronic pain of left knee  Unsteadiness on feet  ONSET DATE: DOS 12/07/23   SUBJECTIVE:  SUBJECTIVE STATEMENT:  Interpreter present for session. The patient is apporx 5 weeks post op. She continues to report soreness but overall she is doing well. She didn't get too sore after the last visit.  PERTINENT HISTORY:  OA  PAIN:  Are you having pain? Yes: NPRS scale: 2/10 Pain location: Lt knee Pain description: constant Aggravating factors: when brace slides down and hits knee Relieving factors: ice  PRECAUTIONS:  None  RED FLAGS: None   WEIGHT BEARING RESTRICTIONS:  WBAT  FALLS:  Has patient fallen in last 6 months? No   OCCUPATION:  Not working  PLOF:  Independent  PATIENT GOALS:  Walk without AD, navigate stairs     OBJECTIVE:  Note: Objective measures were completed at Evaluation unless otherwise noted.   PATIENT SURVEYS:  LEFS  Extreme difficulty/unable (0), Quite a bit of difficulty (1), Moderate difficulty (2), Little difficulty (3), No difficulty (4) Survey date:    Any of your usual work, housework or school activities   2. Usual hobbies, recreational or sporting activities   3. Getting into/out of the bath 3  4. Walking between rooms 2  5. Putting on socks/shoes   6. Squatting    7. Lifting an object, like a bag of groceries from the floor   8. Performing light activities around your home    9. Performing heavy activities around your home   10. Getting into/out of a car 2  11. Walking 2 blocks   12. Walking 1 mile   13. Going up/down 10 stairs (1 flight)   14. Standing for 1 hour   15.  sitting for 1 hour 2  16. Running on even ground   17. Running on uneven ground   18. Making sharp turns while running fast   19. Hopping    20. Rolling over in bed 2  Score total:  11     COGNITIVE STATUS: Within functional limits for tasks assessed   SENSATION: WFL  EDEMA:  Yes: mild as expected post op   GAIT: Comments: EVAL  arrived with rollator and brace locked in extension   Body Part #1 Knee  PALPATION: EVAL: no s/s of infection, good patellar mobility  LOWER EXTREMITY ROM:     Passive  Left eval Left 12/24/23 AAROM Left   Left  11/14  Knee flexion 50 63 80  98  Knee extension 0 0     Ankle dorsiflexion       Ankle plantarflexion       Ankle inversion       Ankle eversion        (Blank rows = not tested)   TREATMENT DATE:  11/14 Manual: STM to quad Edema massage Extension stretch with distraction    There-ex:  Quad set x12 SLR 3x10 no extensor lag  Reviewed and updated HEP  Nu-step 5 min  SAQ x12   Gait: reviewed use of cane. See assessment. SBA with belt 300' with using to keep hand in the right hand.   There-act:  Step up 2 inch 3x10   11/11 Manual: STM to quad Edema massage Extension stretch with distraction    There-ex:  Quad set 2x12 SLR 3x10 no extensor lag  Reviewed and updated HEP  Nu-step 5 min  Tried ex bike but was too tight Reviewed how to do for ROM but needed too much cuing   Neuro-re-ed:  Standing weight shift 3x10  Standing slow march 2x10 with cuing to use UE for stability.  10/29 Manual: STM to quad Edema massage   There-ex:  Quad set 2x10  Reviewed technique with heel slide  SLR 2x10  Patient could not tolerate bolster for SAQ today.  She tolerated the ball for SAQ 2x10   Neuromuscular  reeducation:  Standing weight shifts 3 x 10 with explanation of benefit of weight shifts.  Self care: pt instructed in massage around incisions to assist with desensitization; pt returned demo.  Incisions clean/dry with no signs or symptoms of infection (no dressing present).  Discussed with pt and her daughter how to adjust brace.  Therapeutic exercise:   Lt Quad set x 5 sec x 5 LLE SLR with 4 sec descent to starting position x 10 LLE heel slide x 2 AROM-> with strap assist x 10, with 10 sec hold (range to tolerance) LLE SAQ in comfortable range x 10  Manual therapy:  Lt patellar mobs;  light effleurage to Lt lower leg to assist in edema reduction  PATIENT EDUCATION:  Education details: HEP, exercise form/rationale Person educated: Patient and Child(ren) Education method: Explanation, Demonstration, Tactile cues, Verbal cues Education comprehension: verbalized understanding, returned demonstration, verbal cues required, tactile cues required, and needs further education   HOME EXERCISE PROGRAM: Access Code: [email or web address] URL: [email or web address] Date: 12/14/2023 Prepared by: Harlene Cordon  Exercises - Supine Quad Set  - 3-4 x daily - 7 x weekly - 1 sets - 10 reps - 5s hold - Active Straight Leg Raise with Quad Set  - 3-4 x daily - 7 x weekly - 2 sets - 10 reps - Sidelying Hip Abduction  - 3-4 x daily - 7 x weekly - 3 sets - 10 reps - Supine Heel Slide  - 3-4 x daily - 7 x weekly - 1 sets - 10 reps - 3 breaths hold   ASSESSMENT:  CLINICAL IMPRESSION: The patients ROM continues to improve. We reviewed ambulation with the cane today. She tolerated well. She was advised she can begin practicing at home. She usually uses the cane in the left hand. She was advised to try to use it on the left if she can. She was advised to do what she feels is safest.  Therapy began low-grade stair training today.  She was able to do 2 inch step up.  She reported fatigue following  treatment but no significant increase in pain.  Therapy will continue to progress as tolerated.   Initial eval:  Patient is a 78 y.o. F who was seen today for physical therapy evaluation and treatment for s/p Lt medial meniscus repair.      REHAB POTENTIAL: Good  CLINICAL DECISION MAKING: Stable/uncomplicated  EVALUATION COMPLEXITY: Low   GOALS: Goals reviewed with patient? Yes  SHORT TERM GOALS: Target date: 01/12/2024  Ambulation without AD, proper form Baseline: Goal status: INITIAL  2.  SLR without quad lag Baseline:  Goal status: INITIAL  3.  Proper control of TKE from mini squat Baseline:  Goal status: INITIAL   LONG TERM GOALS: Target date: POC date  Navigate stairs reciprocally without increased pain >2/10  Baseline:  Goal status: INITIAL  2.  Proximal control through lunges without increase in pain (week 12) Baseline:  Goal status: INITIAL  3.  Single leg balance with head and UE motion Baseline:  Goal status: INITIAL  4.  Average pain with ADLs <=2/10 Baseline:  Goal status: INITIAL  5.  Quad strength, via hand held dyno testing, 85% of non-operative leg Baseline:  Goal status: INITIAL  PLAN:  PT FREQUENCY: 1-2x/week  PT DURATION: POC date  PLANNED INTERVENTIONS: 97164- PT Re-evaluation, 97750- Physical Performance Testing, 97110-Therapeutic exercises, 97530- Therapeutic activity, [id number]- Neuromuscular re-education, 97535- Self Care, 02859- Manual therapy, [phone]- Gait training, [phone]- Aquatic Therapy, [phone] (1-2 muscles), 20561 (3+ muscles)- Dry Needling, Patient/Family education, Balance training, Stair training, Taping, Joint mobilization, Spinal mobilization, Scar mobilization, and Cryotherapy.  PLAN FOR NEXT SESSION: progress gait training, unlock brace if able to demo SLR x10 without lag  Alm Don PT DPT 01/19/24 2:39 PM [facility] [address] [place], [place],  [address] Phone: [phone]   Fax:  [phone]

## 2024-01-19 ENCOUNTER — Ambulatory Visit (HOSPITAL_BASED_OUTPATIENT_CLINIC_OR_DEPARTMENT_OTHER): Admitting: Physical Therapy

## 2024-01-19 DIAGNOSIS — G8929 Other chronic pain: Secondary | ICD-10-CM

## 2024-01-19 DIAGNOSIS — R2681 Unsteadiness on feet: Secondary | ICD-10-CM

## 2024-01-19 DIAGNOSIS — R262 Difficulty in walking, not elsewhere classified: Secondary | ICD-10-CM

## 2024-01-19 DIAGNOSIS — M6281 Muscle weakness (generalized): Secondary | ICD-10-CM

## 2024-01-19 DIAGNOSIS — R293 Abnormal posture: Secondary | ICD-10-CM | POA: Diagnosis not present

## 2024-01-19 NOTE — Therapy (Signed)
 OUTPATIENT PHYSICAL THERAPY TREATMENT   Patient Name: Dana Daniels MRN: [id number] DOB:07/07/1945, 78 y.o., female Today's Date: 01/20/2024  END OF SESSION:  PT End of Session - 01/20/24 0837     Visit Number 6    Date for Recertification  02/27/24    PT Start Time 1430    PT Stop Time 1512    PT Time Calculation (min) 42 min    Activity Tolerance Patient tolerated treatment well    Behavior During Therapy WFL for tasks assessed/performed              Past Medical History:  Diagnosis Date   Allergy    Arthritis    Asthma    Blood transfusion without reported diagnosis    Cataract    Hyperlipidemia    Hypertension    Migraine    Osteoporosis    Sleep apnea    Past Surgical History:  Procedure Laterality Date   ABDOMINAL HYSTERECTOMY     CHOLECYSTECTOMY     EYE SURGERY Left    cataract    FOOT SURGERY Left    KNEE ARTHROSCOPY WITH MENISCAL REPAIR Left 12/07/2023   Procedure: ARTHROSCOPY, KNEE, WITH MENISCUS REPAIR;  Surgeon: Genelle Standing, MD;  Location: [facility];  Service: Orthopedics;  Laterality: Left;  LEFT KNEE ARTHROSCOPY WITH MENISCAL ROOT REPAIR   Patient Active Problem List   Diagnosis Date Noted   Acute medial meniscus tear of left knee 12/07/2023   Conductive hearing loss of right ear with unrestricted hearing of left ear 11/23/2023   Chronic insomnia 09/30/2022   Bilateral foot pain 08/05/2021   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   Seasonal allergies 03/31/2018   History of gastroesophageal reflux (GERD) 09/25/2017   Osteoarthritis of multiple joints 03/24/2017   Chronic pain of left knee 03/24/2017   Chronic obstructive pulmonary disease (HCC) 03/24/2017   Primary cancer of skin of left foot 02/03/2017   Mild intermittent asthma without complication 02/03/2017   Obstructive sleep apnea 02/03/2017   Dyslipidemia 02/03/2017   Migraine 02/03/2017   Essential hypertension  09/18/2008     REFERRING PROVIDER:  Genelle Standing, MD      REFERRING DIAG:  S83.242A (ICD-10-CM) - Acute medial meniscus tear of left knee, initial encounter    s/p PROCEDURE: 1. Left knee medial meniscal repair   Rationale for Evaluation and Treatment: Rehabilitation  THERAPY DIAG:  Difficulty in walking, not elsewhere classified  Muscle weakness (generalized)  Chronic pain of left knee  Unsteadiness on feet  ONSET DATE: DOS 12/07/23   SUBJECTIVE:  SUBJECTIVE STATEMENT:  Interpreter present for session. The patient reports she got very sore after the last visit. Her pain reached about a 7/10 and lasted 2 days.   PERTINENT HISTORY:  OA  PAIN:  Are you having pain? Yes: NPRS scale: 2/10 Pain location: Lt knee Pain description: constant Aggravating factors: when brace slides down and hits knee Relieving factors: ice  PRECAUTIONS:  None  RED FLAGS: None   WEIGHT BEARING RESTRICTIONS:  WBAT  FALLS:  Has patient fallen in last 6 months? No   OCCUPATION:  Not working  PLOF:  Independent  PATIENT GOALS:  Walk without AD, navigate stairs     OBJECTIVE:  Note: Objective measures were completed at Evaluation unless otherwise noted.   PATIENT SURVEYS:  LEFS  Extreme difficulty/unable (0), Quite a bit of difficulty (1), Moderate difficulty (2), Little difficulty (3), No difficulty (4) Survey date:    Any of your usual work, housework or school activities   2. Usual hobbies, recreational or sporting activities   3. Getting into/out of the bath 3  4. Walking between rooms 2  5. Putting on socks/shoes   6. Squatting    7. Lifting an object, like a bag of groceries from the floor   8. Performing light activities around your home   9. Performing heavy activities  around your home   10. Getting into/out of a car 2  11. Walking 2 blocks   12. Walking 1 mile   13. Going up/down 10 stairs (1 flight)   14. Standing for 1 hour   15.  sitting for 1 hour 2  16. Running on even ground   17. Running on uneven ground   18. Making sharp turns while running fast   19. Hopping    20. Rolling over in bed 2  Score total:  11     COGNITIVE STATUS: Within functional limits for tasks assessed   SENSATION: WFL  EDEMA:  Yes: mild as expected post op   GAIT: Comments: EVAL  arrived with rollator and brace locked in extension   Body Part #1 Knee  PALPATION: EVAL: no s/s of infection, good patellar mobility  LOWER EXTREMITY ROM:     Passive  Left eval Left 12/24/23 AAROM Left   Left  11/14   Knee flexion 50 63 80  98   Knee extension 0 0      Ankle dorsiflexion        Ankle plantarflexion        Ankle inversion        Ankle eversion         (Blank rows = not tested)   TREATMENT DATE:   11/18 Manual: STM to quad Edema massage Extension stretch with distraction    There-ex:  Quad set x12 SLR 3x10 no extensor lag  Reviewed and updated HEP  Nu-step 5 min  Seated hip abudction red 3x10   Neuro re-ed   11/14 Manual: STM to quad Edema massage Extension stretch with distraction    There-ex:  Quad set x12 SLR 3x10 no extensor lag  Reviewed and updated HEP  Nu-step 5 min  SAQ x12   Gait: reviewed use of cane. See assessment. SBA with belt 300' with using to keep hand in the right hand.    11/11 Manual: STM to quad Edema massage Extension stretch with distraction    There-ex:  Quad set 2x12 SLR 3x10 no extensor lag  Reviewed and updated HEP  Nu-step 5 min  Tried ex bike but was too tight Reviewed how to do for ROM but needed too much cuing   Neuro-re-ed:  Standing weight shift 3x10  Standing slow march 2x10 with cuing to use UE for stability.       PATIENT EDUCATION:  Education details: HEP, exercise  form/rationale Person educated: Patient and Child(ren) Education method: Explanation, Demonstration, Tactile cues, Verbal cues Education comprehension: verbalized understanding, returned demonstration, verbal cues required, tactile cues required, and needs further education   HOME EXERCISE PROGRAM: Access Code: [email or web address] URL: [email or web address] Date: 12/14/2023 Prepared by: Harlene Cordon  Exercises - Supine Quad Set  - 3-4 x daily - 7 x weekly - 1 sets - 10 reps - 5s hold - Active Straight Leg Raise with Quad Set  - 3-4 x daily - 7 x weekly - 2 sets - 10 reps - Sidelying Hip Abduction  - 3-4 x daily - 7 x weekly - 3 sets - 10 reps - Supine Heel Slide  - 3-4 x daily - 7 x weekly - 1 sets - 10 reps - 3 breaths hold   ASSESSMENT:  CLINICAL IMPRESSION: Therapy held on step training and gait training today 2nd to pain from last visit. We worked on her base exercise program. Her range was slightly limited. We focused on manual therapy and light exercises. We will re-assess tolerance next visit.  Initial eval:  Patient is a 78 y.o. F who was seen today for physical therapy evaluation and treatment for s/p Lt medial meniscus repair.      REHAB POTENTIAL: Good  CLINICAL DECISION MAKING: Stable/uncomplicated  EVALUATION COMPLEXITY: Low   GOALS: Goals reviewed with patient? Yes  SHORT TERM GOALS: Target date: 01/12/2024  Ambulation without AD, proper form Baseline: Goal status: attempted las tvisit but patient had increased pain 11/18   2.  SLR without quad lag Baseline:  Goal status: achieved 11/18   3.  Proper control of TKE from mini squat Baseline:  Goal status: INITIAL   LONG TERM GOALS: Target date: POC date  Navigate stairs reciprocally without increased pain >2/10  Baseline:  Goal status: INITIAL  2.  Proximal control through lunges without increase in pain (week 12) Baseline:  Goal status: INITIAL  3.  Single leg balance with head  and UE motion Baseline:  Goal status: INITIAL  4.  Average pain with ADLs <=2/10 Baseline:  Goal status: INITIAL  5.  Quad strength, via hand held dyno testing, 85% of non-operative leg Baseline:  Goal status: INITIAL     PLAN:  PT FREQUENCY: 1-2x/week  PT DURATION: POC date  PLANNED INTERVENTIONS: 97164- PT Re-evaluation, 97750- Physical Performance Testing, 97110-Therapeutic exercises, 97530- Therapeutic activity, [id number]- Neuromuscular re-education, 97535- Self Care, 02859- Manual therapy, [id number]- Gait training, [phone]- Aquatic Therapy, [phone] (1-2 muscles), 20561 (3+ muscles)- Dry Needling, Patient/Family education, Balance training, Stair training, Taping, Joint mobilization, Spinal mobilization, Scar mobilization, and Cryotherapy.  PLAN FOR NEXT SESSION: progress gait training, unlock brace if able to demo SLR x10 without lag  Alm Don PT DPT 01/20/24 8:47 AM [facility] [address] [place], [place], [address] Phone: [phone]   Fax:  [phone]

## 2024-01-22 ENCOUNTER — Ambulatory Visit (HOSPITAL_BASED_OUTPATIENT_CLINIC_OR_DEPARTMENT_OTHER): Payer: Self-pay | Admitting: Orthopaedic Surgery

## 2024-01-22 ENCOUNTER — Ambulatory Visit (HOSPITAL_BASED_OUTPATIENT_CLINIC_OR_DEPARTMENT_OTHER): Admitting: Physical Therapy

## 2024-01-22 ENCOUNTER — Encounter (HOSPITAL_BASED_OUTPATIENT_CLINIC_OR_DEPARTMENT_OTHER): Payer: Self-pay | Admitting: Physical Therapy

## 2024-01-22 ENCOUNTER — Other Ambulatory Visit: Payer: Self-pay | Admitting: Emergency Medicine

## 2024-01-22 ENCOUNTER — Ambulatory Visit (INDEPENDENT_AMBULATORY_CARE_PROVIDER_SITE_OTHER): Admitting: Orthopaedic Surgery

## 2024-01-22 DIAGNOSIS — J302 Other seasonal allergic rhinitis: Secondary | ICD-10-CM

## 2024-01-22 DIAGNOSIS — J449 Chronic obstructive pulmonary disease, unspecified: Secondary | ICD-10-CM

## 2024-01-22 DIAGNOSIS — Z8719 Personal history of other diseases of the digestive system: Secondary | ICD-10-CM

## 2024-01-22 DIAGNOSIS — I10 Essential (primary) hypertension: Secondary | ICD-10-CM

## 2024-01-22 DIAGNOSIS — S83242A Other tear of medial meniscus, current injury, left knee, initial encounter: Secondary | ICD-10-CM

## 2024-01-22 DIAGNOSIS — M6281 Muscle weakness (generalized): Secondary | ICD-10-CM

## 2024-01-22 DIAGNOSIS — Z8669 Personal history of other diseases of the nervous system and sense organs: Secondary | ICD-10-CM

## 2024-01-22 DIAGNOSIS — R262 Difficulty in walking, not elsewhere classified: Secondary | ICD-10-CM

## 2024-01-22 DIAGNOSIS — G8929 Other chronic pain: Secondary | ICD-10-CM

## 2024-01-22 DIAGNOSIS — R293 Abnormal posture: Secondary | ICD-10-CM | POA: Diagnosis not present

## 2024-01-22 MED ORDER — OMEPRAZOLE 40 MG PO CPDR: 40.0000 mg | DELAYED_RELEASE_CAPSULE | Freq: Every day | ORAL | 3 refills | Status: AC

## 2024-01-22 MED ORDER — UBRELVY 100 MG PO TABS: 100.0000 mg | ORAL_TABLET | Freq: Every day | ORAL | 3 refills | Status: AC | PRN

## 2024-01-22 MED ORDER — TRELEGY ELLIPTA 100-62.5-25 MCG/ACT IN AEPB: INHALATION_SPRAY | RESPIRATORY_TRACT | 3 refills | Status: AC

## 2024-01-22 MED ORDER — DILTIAZEM HCL ER BEADS 240 MG PO CP24
240.0000 mg | ORAL_CAPSULE | Freq: Every day | ORAL | 0 refills | Status: AC
Start: 2024-01-22 — End: ?

## 2024-01-22 MED ORDER — MONTELUKAST SODIUM 10 MG PO TABS: 10.0000 mg | ORAL_TABLET | Freq: Every day | ORAL | 3 refills | Status: AC

## 2024-01-22 MED ORDER — ALBUTEROL SULFATE HFA 108 (90 BASE) MCG/ACT IN AERS: 2.0000 | INHALATION_SPRAY | Freq: Four times a day (QID) | RESPIRATORY_TRACT | 0 refills | Status: DC | PRN

## 2024-01-22 NOTE — Telephone Encounter (Signed)
 Copied from CRM [id number]. Topic: Clinical - Medication Refill >> Jan 22, 2024 10:02 AM Shereese L wrote: Medication: albuterol  (VENTOLIN  HFA) 108 (90 Base) MCG/ACT inhaler montelukast  (SINGULAIR ) 10 MG tablet omeprazole  (PRILOSEC) 40 MG capsule diltiazem  (TIAZAC ) 240 MG 24 hr capsule Fluticasone -Umeclidin-Vilant (TRELEGY ELLIPTA ) 100-62.5-25 MCG/ACT AEPB Ubrogepant  (UBRELVY ) 100 MG TABS     Has the patient contacted their pharmacy? Yes (Agent: If no, request that the patient contact the pharmacy for the refill. If patient does not wish to contact the pharmacy document the reason why and proceed with request.) (Agent: If yes, when and what did the pharmacy advise?)  This is the patient's preferred pharmacy:  [facility] [address] ([address]), [place] - [address] [place] ([place]) [place] [address] Phone: [phone] Fax: [phone]   Is this the correct pharmacy for this prescription? Yes If no, delete pharmacy and type the correct one.   Has the prescription been filled recently? Yes  Is the patient out of the medication? Yes  Has the patient been seen for an appointment in the last year OR does the patient have an upcoming appointment? Yes  Can we respond through MyChart? Yes  Agent: Please be advised that Rx refills may take up to 3 business days. We ask that you follow-up with your pharmacy.

## 2024-01-22 NOTE — Therapy (Unsigned)
 OUTPATIENT PHYSICAL THERAPY TREATMENT   Patient Name: Dana Daniels MRN: [id number] DOB:1945/03/26, 78 y.o., female Today's Date: 01/24/2024  END OF SESSION:  PT End of Session - 01/24/24 1856     Visit Number 7    Date for Recertification  02/27/24    PT Start Time 1430    PT Stop Time 1513    PT Time Calculation (min) 43 min    Activity Tolerance Patient tolerated treatment well    Behavior During Therapy WFL for tasks assessed/performed               Past Medical History:  Diagnosis Date   Allergy    Arthritis    Asthma    Blood transfusion without reported diagnosis    Cataract    Hyperlipidemia    Hypertension    Migraine    Osteoporosis    Sleep apnea    Past Surgical History:  Procedure Laterality Date   ABDOMINAL HYSTERECTOMY     CHOLECYSTECTOMY     EYE SURGERY Left    cataract    FOOT SURGERY Left    KNEE ARTHROSCOPY WITH MENISCAL REPAIR Left 12/07/2023   Procedure: ARTHROSCOPY, KNEE, WITH MENISCUS REPAIR;  Surgeon: Genelle Standing, MD;  Location: [facility];  Service: Orthopedics;  Laterality: Left;  LEFT KNEE ARTHROSCOPY WITH MENISCAL ROOT REPAIR   Patient Active Problem List   Diagnosis Date Noted   Acute medial meniscus tear of left knee 12/07/2023   Conductive hearing loss of right ear with unrestricted hearing of left ear 11/23/2023   Chronic insomnia 09/30/2022   Bilateral foot pain 08/05/2021   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   Seasonal allergies 03/31/2018   History of gastroesophageal reflux (GERD) 09/25/2017   Osteoarthritis of multiple joints 03/24/2017   Chronic pain of left knee 03/24/2017   Chronic obstructive pulmonary disease (HCC) 03/24/2017   Primary cancer of skin of left foot 02/03/2017   Mild intermittent asthma without complication 02/03/2017   Obstructive sleep apnea 02/03/2017   Dyslipidemia 02/03/2017   Migraine 02/03/2017   Essential hypertension  09/18/2008     REFERRING PROVIDER:  Genelle Standing, MD      REFERRING DIAG:  S83.242A (ICD-10-CM) - Acute medial meniscus tear of left knee, initial encounter    s/p PROCEDURE: 1. Left knee medial meniscal repair   Rationale for Evaluation and Treatment: Rehabilitation  THERAPY DIAG:  Difficulty in walking, not elsewhere classified  Muscle weakness (generalized)  Chronic pain of left knee  ONSET DATE: DOS 12/07/23   SUBJECTIVE:  SUBJECTIVE STATEMENT:  Interpreter present for session. The patient has seen the MD. He is very happy with her progress. He would ike her to stop using the brace.  PERTINENT HISTORY:  OA  PAIN:  Are you having pain? Yes: NPRS scale: 2/10 Pain location: Lt knee Pain description: constant Aggravating factors: when brace slides down and hits knee Relieving factors: ice  PRECAUTIONS:  None  RED FLAGS: None   WEIGHT BEARING RESTRICTIONS:  WBAT  FALLS:  Has patient fallen in last 6 months? No   OCCUPATION:  Not working  PLOF:  Independent  PATIENT GOALS:  Walk without AD, navigate stairs     OBJECTIVE:  Note: Objective measures were completed at Evaluation unless otherwise noted.   PATIENT SURVEYS:  LEFS  Extreme difficulty/unable (0), Quite a bit of difficulty (1), Moderate difficulty (2), Little difficulty (3), No difficulty (4) Survey date:    Any of your usual work, housework or school activities   2. Usual hobbies, recreational or sporting activities   3. Getting into/out of the bath 3  4. Walking between rooms 2  5. Putting on socks/shoes   6. Squatting    7. Lifting an object, like a bag of groceries from the floor   8. Performing light activities around your home   9. Performing heavy activities around your home   10. Getting  into/out of a car 2  11. Walking 2 blocks   12. Walking 1 mile   13. Going up/down 10 stairs (1 flight)   14. Standing for 1 hour   15.  sitting for 1 hour 2  16. Running on even ground   17. Running on uneven ground   18. Making sharp turns while running fast   19. Hopping    20. Rolling over in bed 2  Score total:  11     COGNITIVE STATUS: Within functional limits for tasks assessed   SENSATION: WFL  EDEMA:  Yes: mild as expected post op   GAIT: Comments: EVAL  arrived with rollator and brace locked in extension   Body Part #1 Knee  PALPATION: EVAL: no s/s of infection, good patellar mobility  LOWER EXTREMITY ROM:     Passive  Left eval Left 12/24/23 AAROM Left   Left  11/14   Knee flexion 50 63 80  98   Knee extension 0 0      Ankle dorsiflexion        Ankle plantarflexion        Ankle inversion        Ankle eversion         (Blank rows = not tested)   TREATMENT DATE:  11/21 Manual: STM to quad Edema massage Extension stretch with distraction   There-ex:  Quad set x12 SLR 3x10 no extensor lag  Nu-step 5 min  SAQ 3x10   There-act:  Step up 2x10 2 inch  Lateral step up 2x10 2 inch    11/18 Manual: STM to quad Edema massage Extension stretch with distraction    There-ex:  Quad set x12 SLR 3x10 no extensor lag  Nu-step 5 min   There-ex:  Quad set x12 SLR 3x10 no extensor lag  Reviewed and updated HEP  Nu-step 5 min  Seated hip abudction red 3x10   Neuro re-ed   11/14 Manual: STM to quad Edema massage Extension stretch with distraction    There-ex:  Quad set x12 SLR 3x10 no extensor lag  Reviewed and updated HEP  Nu-step  5 min  SAQ x12   Gait: reviewed use of cane. See assessment. SBA with belt 300' with using to keep hand in the right hand.      PATIENT EDUCATION:  Education details: HEP, exercise form/rationale Person educated: Patient and Child(ren) Education method: Explanation, Demonstration, Tactile cues,  Verbal cues Education comprehension: verbalized understanding, returned demonstration, verbal cues required, tactile cues required, and needs further education   HOME EXERCISE PROGRAM: Access Code: [email or web address] URL: [email or web address] Date: 12/14/2023 Prepared by: Harlene Cordon  Exercises - Supine Quad Set  - 3-4 x daily - 7 x weekly - 1 sets - 10 reps - 5s hold - Active Straight Leg Raise with Quad Set  - 3-4 x daily - 7 x weekly - 2 sets - 10 reps - Sidelying Hip Abduction  - 3-4 x daily - 7 x weekly - 3 sets - 10 reps - Supine Heel Slide  - 3-4 x daily - 7 x weekly - 1 sets - 10 reps - 3 breaths hold   ASSESSMENT:  CLINICAL IMPRESSION: Patient is making good progress.  Her range of motion is progressing.  She continues has some limitation in flexion.  We worked on museum/gallery curator today.  He tolerated stair training well.  Therapy will continue to progress as tolerated.   Initial eval:  Patient is a 78 y.o. F who was seen today for physical therapy evaluation and treatment for s/p Lt medial meniscus repair.      REHAB POTENTIAL: Good  CLINICAL DECISION MAKING: Stable/uncomplicated  EVALUATION COMPLEXITY: Low   GOALS: Goals reviewed with patient? Yes  SHORT TERM GOALS: Target date: 01/12/2024  Ambulation without AD, proper form Baseline: Goal status: attempted las tvisit but patient had increased pain 11/18   2.  SLR without quad lag Baseline:  Goal status: achieved 11/18   3.  Proper control of TKE from mini squat Baseline:  Goal status: INITIAL   LONG TERM GOALS: Target date: POC date  Navigate stairs reciprocally without increased pain >2/10  Baseline:  Goal status: Begin stair training 11/21  2.  Proximal control through lunges without increase in pain (week 12) Baseline:  Goal status: INITIAL  3.  Single leg balance with head and UE motion Baseline:  Goal status: INITIAL  4.  Average pain with ADLs <=2/10 Baseline:  Goal  status: INITIAL  5.  Quad strength, via hand held dyno testing, 85% of non-operative leg Baseline:  Goal status: INITIAL     PLAN:  PT FREQUENCY: 1-2x/week  PT DURATION: POC date  PLANNED INTERVENTIONS: 97164- PT Re-evaluation, 97750- Physical Performance Testing, 97110-Therapeutic exercises, 97530- Therapeutic activity, [id number]- Neuromuscular re-education, 97535- Self Care, 02859- Manual therapy, [id number]- Gait training, [phone]- Aquatic Therapy, [phone] (1-2 muscles), [phone] (3+ muscles)- Dry Needling, Patient/Family education, Balance training, Stair training, Taping, Joint mobilization, Spinal mobilization, Scar mobilization, and Cryotherapy.  PLAN FOR NEXT SESSION: progress gait training, unlock brace if able to demo SLR x10 without lag  Alm Don PT DPT 01/24/24 6:58 PM [facility] [address] [place], [place], [address] Phone: [phone]   Fax:  [phone]

## 2024-01-22 NOTE — Progress Notes (Signed)
 Post Operative Evaluation      Procedure/Date of Surgery: Left knee medial meniscus repair with centralization 12/07/2023   Interval History:    Patient presents today 6 weeks status post the above procedure.  At this time is doing extremely well.  Denies any significant pain.  She is still walking with a walker     PMH/PSH/Family History/Social History/Meds/Allergies:         Past Medical History:  Diagnosis Date   Allergy     Arthritis     Asthma     Blood transfusion without reported diagnosis     Cataract     Hyperlipidemia     Hypertension     Migraine     Osteoporosis     Sleep apnea               Past Surgical History:  Procedure Laterality Date   ABDOMINAL HYSTERECTOMY       CHOLECYSTECTOMY       EYE SURGERY Left      cataract    FOOT SURGERY Left     KNEE ARTHROSCOPY WITH MENISCAL REPAIR Left 12/07/2023    Procedure: ARTHROSCOPY, KNEE, WITH MENISCUS REPAIR;  Surgeon: Genelle Standing, MD;  [facility];  Service: Orthopedics;  Laterality: Left;  LEFT KNEE ARTHROSCOPY WITH MENISCAL ROOT REPAIR        Social History         Socioeconomic History   Marital status: Widowed      Spouse name: Not on file   Number of children: Not on file   Years of education: Not on file   Highest education level: Associate degree: occupational, scientist, product/process development, or vocational program  Occupational History   Not on file  Tobacco Use   Smoking status: Never   Smokeless tobacco: Never  Vaping Use   Vaping status: Never Used  Substance and Sexual Activity   Alcohol use: No   Drug use: Never   Sexual activity: Not on file  Other Topics Concern   Not on file  Social History Narrative   Not on file    Social Drivers of Health        Financial Resource Strain: Patient Declined (05/26/2023)    Overall Financial Resource Strain (CARDIA)     Difficulty of Paying Living Expenses: Patient declined  Food Insecurity:  Patient Declined (05/26/2023)    Hunger Vital Sign     Worried About Running Out of Food in the Last Year: Patient declined     Ran Out of Food in the Last Year: Patient declined  Transportation Needs: Patient Declined (05/26/2023)    PRAPARE - Therapist, Art (Medical): Patient declined     Lack of Transportation (Non-Medical): Patient declined  Physical Activity: Insufficiently Active (05/26/2023)    Exercise Vital Sign     Days of Exercise per Week: 3 days     Minutes of Exercise per Session: 20 min  Stress: No Stress Concern Present (05/26/2023)    [employer] of Occupational Health - Occupational Stress Questionnaire     Feeling of Stress : Not at all  Social Connections: Unknown (05/26/2023)    Social Connection and Isolation Panel     Frequency of Communication with Friends  and Family: More than three times a week     Frequency of Social Gatherings with Friends and Family: Twice a week     Attends Religious Services: Patient declined     Database Administrator or Organizations: Patient declined     Attends Banker Meetings: Patient declined     Marital Status: Widowed         Family History  Problem Relation Age of Onset   Heart disease Mother     Hyperlipidemia Mother     Hypertension Mother     Stroke Mother     Heart disease Father     Stroke Father     Diabetes Daughter     Hypertension Daughter     Hypertension Son     Hyperlipidemia Son     Fibromyalgia Son     Lupus Son     Sleep apnea Son     Hypertension Son     Sleep apnea Son          Allergies      Allergies  Allergen Reactions   Tramadol Itching            Current Outpatient Medications  Medication Sig Dispense Refill   acetaminophen  (TYLENOL ) 500 MG tablet Take 1 tablet (500 mg total) by mouth every 8 (eight) hours for 10 days. 30 tablet 0   albuterol  (VENTOLIN  HFA) 108 (90 Base) MCG/ACT inhaler INHALE 1 TO 2 PUFFS BY MOUTH EVERY 4 HOURS AS NEEDED FOR  WHEEZING FOR SHORTNESS OF BREATH 9 g 0   aspirin  EC 325 MG tablet Take 1 tablet (325 mg total) by mouth daily. 14 tablet 0   azithromycin  (ZITHROMAX ) 250 MG tablet Sig as indicated 6 tablet 0   benzonatate  (TESSALON ) 200 MG capsule Take 1 capsule (200 mg total) by mouth 2 (two) times daily as needed for cough. 20 capsule 0   cetirizine  (ZYRTEC ) 10 MG tablet Take 1 tablet (10 mg total) by mouth daily. 10 tablet 11   cromolyn  (NASALCROM ) 5.2 MG/ACT nasal spray Place 1 spray into both nostrils 2 (two) times daily. 26 mL 12   diltiazem  (TIAZAC ) 240 MG 24 hr capsule Take 1 capsule by mouth once daily 90 capsule 0   Fluticasone -Umeclidin-Vilant (TRELEGY ELLIPTA ) 100-62.5-25 MCG/ACT AEPB Inhale 1 puff by mouth once daily 60 each 3   HYDROcodone  bit-homatropine (HYCODAN) 5-1.5 MG/5ML syrup Take 5 mLs by mouth at bedtime as needed for cough. 120 mL 0   montelukast  (SINGULAIR ) 10 MG tablet Take 1 tablet (10 mg total) by mouth at bedtime. 90 tablet 3   nitroGLYCERIN  (NITROSTAT ) 0.4 MG SL tablet Place 1 tablet (0.4 mg total) under the tongue every 5 (five) minutes as needed for chest pain. 25 tablet 0   omeprazole  (PRILOSEC) 40 MG capsule Take 1 capsule (40 mg total) by mouth daily. 90 capsule 3   oxyCODONE  (ROXICODONE ) 5 MG immediate release tablet Take 1 tablet (5 mg total) by mouth every 4 (four) hours as needed for severe pain (pain score 7-10) or breakthrough pain. 10 tablet 0   simvastatin  (ZOCOR ) 20 MG tablet Take 1 tablet (20 mg total) by mouth daily. 90 tablet 3   Ubrogepant  (UBRELVY ) 100 MG TABS Take 1 tablet (100 mg total) by mouth daily as needed. Take at onset of migraine headache 15 tablet 3   zolpidem  (AMBIEN ) 5 MG tablet Take 1 tablet (5 mg total) by mouth at bedtime as needed for sleep. 15 tablet 1  No current facility-administered medications for this visit.      Imaging Results (Last 48 hours)  No results found.     Review of Systems:   A ROS was performed including pertinent  positives and negatives as documented in the HPI.     Musculoskeletal Exam:     There were no vitals taken for this visit.   Left knee incisions are well-appearing without erythema or drainage.  Mild residual swelling within the knee without ecchymosis.  Knee flexion to 60 degrees today.  Patient is ambulating well with assistance of a walker.   Imaging:       Assessment:   Patient is 6 weeks status post left knee medial meniscal repair with centralization overall doing very well.  At this time, I have asked that she discontinue her brace.  She will continue to wean off of her walker   Plan :     - Return to clinic as needed           I personally saw and evaluated the patient, and participated in the management and treatment plan.

## 2024-01-24 ENCOUNTER — Encounter (HOSPITAL_BASED_OUTPATIENT_CLINIC_OR_DEPARTMENT_OTHER): Payer: Self-pay | Admitting: Physical Therapy

## 2024-01-25 ENCOUNTER — Encounter (HOSPITAL_BASED_OUTPATIENT_CLINIC_OR_DEPARTMENT_OTHER): Admitting: Physical Therapy

## 2024-01-26 ENCOUNTER — Other Ambulatory Visit: Payer: Self-pay | Admitting: Emergency Medicine

## 2024-01-26 DIAGNOSIS — J449 Chronic obstructive pulmonary disease, unspecified: Secondary | ICD-10-CM

## 2024-01-27 ENCOUNTER — Ambulatory Visit (HOSPITAL_BASED_OUTPATIENT_CLINIC_OR_DEPARTMENT_OTHER): Admitting: Physical Therapy

## 2024-01-27 ENCOUNTER — Encounter (HOSPITAL_BASED_OUTPATIENT_CLINIC_OR_DEPARTMENT_OTHER): Payer: Self-pay | Admitting: Physical Therapy

## 2024-01-27 DIAGNOSIS — M6281 Muscle weakness (generalized): Secondary | ICD-10-CM

## 2024-01-27 DIAGNOSIS — R262 Difficulty in walking, not elsewhere classified: Secondary | ICD-10-CM

## 2024-01-27 DIAGNOSIS — R293 Abnormal posture: Secondary | ICD-10-CM | POA: Diagnosis not present

## 2024-01-27 DIAGNOSIS — G8929 Other chronic pain: Secondary | ICD-10-CM

## 2024-01-27 NOTE — Therapy (Signed)
 OUTPATIENT PHYSICAL THERAPY TREATMENT   Patient Name: Dana Daniels MRN: [id number] DOB:02/22/1946, 78 y.o., female Today's Date: 01/27/2024  END OF SESSION:  PT End of Session - 01/27/24 1320     Visit Number 8    Date for Recertification  02/27/24    PT Start Time 1315    PT Stop Time 1353    PT Time Calculation (min) 38 min    Behavior During Therapy WFL for tasks assessed/performed         Past Medical History:  Diagnosis Date   Allergy    Arthritis    Asthma    Blood transfusion without reported diagnosis    Cataract    Hyperlipidemia    Hypertension    Migraine    Osteoporosis    Sleep apnea    Past Surgical History:  Procedure Laterality Date   ABDOMINAL HYSTERECTOMY     CHOLECYSTECTOMY     EYE SURGERY Left    cataract    FOOT SURGERY Left    KNEE ARTHROSCOPY WITH MENISCAL REPAIR Left 12/07/2023   Procedure: ARTHROSCOPY, KNEE, WITH MENISCUS REPAIR;  Surgeon: Genelle Standing, MD;  Location: [facility];  Service: Orthopedics;  Laterality: Left;  LEFT KNEE ARTHROSCOPY WITH MENISCAL ROOT REPAIR   Patient Active Problem List   Diagnosis Date Noted   Acute medial meniscus tear of left knee 12/07/2023   Conductive hearing loss of right ear with unrestricted hearing of left ear 11/23/2023   Chronic insomnia 09/30/2022   Bilateral foot pain 08/05/2021   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   Seasonal allergies 03/31/2018   History of gastroesophageal reflux (GERD) 09/25/2017   Osteoarthritis of multiple joints 03/24/2017   Chronic pain of left knee 03/24/2017   Chronic obstructive pulmonary disease (HCC) 03/24/2017   Primary cancer of skin of left foot 02/03/2017   Mild intermittent asthma without complication 02/03/2017   Obstructive sleep apnea 02/03/2017   Dyslipidemia 02/03/2017   Migraine 02/03/2017   Essential hypertension 09/18/2008     REFERRING PROVIDER:  Genelle Standing, MD       REFERRING DIAG:  S83.242A (ICD-10-CM) - Acute medial meniscus tear of left knee, initial encounter    s/p PROCEDURE: 1. Left knee medial meniscal repair   Rationale for Evaluation and Treatment: Rehabilitation  THERAPY DIAG:  Difficulty in walking, not elsewhere classified  Muscle weakness (generalized)  Chronic pain of left knee  ONSET DATE: DOS 12/07/23   SUBJECTIVE:  SUBJECTIVE STATEMENT:  Pt reports she is doing ok; nothing new to report.  She reported that after therapist massaged knee last session, it felt better afterwards.   Pt's daughter is present to interpret.  PERTINENT HISTORY:  OA  PAIN:  Are you having pain? Yes: NPRS scale: 2/10 Pain location: Lt knee Pain description: tight Aggravating factors:  Relieving factors: ice  PRECAUTIONS:  None  RED FLAGS: None   WEIGHT BEARING RESTRICTIONS:  WBAT  FALLS:  Has patient fallen in last 6 months? No   OCCUPATION:  Not working  PLOF:  Independent  PATIENT GOALS:  Walk without AD, navigate stairs     OBJECTIVE:  Note: Objective measures were completed at Evaluation unless otherwise noted.   PATIENT SURVEYS:  LEFS  Extreme difficulty/unable (0), Quite a bit of difficulty (1), Moderate difficulty (2), Little difficulty (3), No difficulty (4) Survey date:    Any of your usual work, housework or school activities   2. Usual hobbies, recreational or sporting activities   3. Getting into/out of the bath 3  4. Walking between rooms 2  5. Putting on socks/shoes   6. Squatting    7. Lifting an object, like a bag of groceries from the floor   8. Performing light activities around your home   9. Performing heavy activities around your home   10. Getting into/out of a car 2  11. Walking 2 blocks   12.  Walking 1 mile   13. Going up/down 10 stairs (1 flight)   14. Standing for 1 hour   15.  sitting for 1 hour 2  16. Running on even ground   17. Running on uneven ground   18. Making sharp turns while running fast   19. Hopping    20. Rolling over in bed 2  Score total:  11     COGNITIVE STATUS: Within functional limits for tasks assessed   SENSATION: WFL  EDEMA:  Yes: mild as expected post op   GAIT: Comments: EVAL  arrived with rollator and brace locked in extension   Body Part #1 Knee  PALPATION: EVAL: no s/s of infection, good patellar mobility  LOWER EXTREMITY ROM:     Passive  Left eval Left 12/24/23 AAROM Left  Left  11/14   Knee flexion 50 63 80 98   Knee extension 0 0     Ankle dorsiflexion       Ankle plantarflexion       Ankle inversion       Ankle eversion        (Blank rows = not tested)   TREATMENT DATE:   01/27/24 - partial revolutions on scifit bike x 6 min - Lt forward step up/Rt retro step down 4 x 10 with light UE on counter - Lt lateral 2 step up x 10 - heel/toe raises at counter x 10  - Lt calf stretch at counter x 10-15sec x 3 (cues to slow counting) - Lt forward step up, Rt lateral step down - on airex pad with light UE support on counter x 10 - STS in semi staggered stance (left foot back) x 10 without UE - gait: short trials with SPC. Cues for increased Lt knee flexion during swing through - scar mobilization on Lt knee; pt and her daughter instructed on technique - tape application to Lt arch ( over old incision) for scar mobilization/desensitization    11/21 Manual: STM to quad Edema massage Extension stretch with distraction  There-ex:  Quad set x12 SLR 3x10 no extensor lag  Nu-step 5 min  SAQ 3x10   There-act:  Step up 2x10 2 inch  Lateral step up 2x10 2 inch    11/18 Manual: STM to quad Edema massage Extension stretch with distraction    There-ex:  Quad set x12 SLR 3x10 no extensor lag  Nu-step 5  min   There-ex:  Quad set x12 SLR 3x10 no extensor lag  Reviewed and updated HEP  Nu-step 5 min  Seated hip abudction red 3x10   Neuro re-ed   11/14 Manual: STM to quad Edema massage Extension stretch with distraction    There-ex:  Quad set x12 SLR 3x10 no extensor lag  Reviewed and updated HEP  Nu-step 5 min  SAQ x12   Gait: reviewed use of cane. See assessment. SBA with belt 300' with using to keep hand in the right hand.      PATIENT EDUCATION:  Education details: HEP, exercise form/rationale Person educated: Patient and Child(ren) Education method: Explanation, Demonstration, Tactile cues, Verbal cues Education comprehension: verbalized understanding, returned demonstration, verbal cues required, tactile cues required, and needs further education   HOME EXERCISE PROGRAM: Access Code: [email or web address] URL: [email or web address] Date: 12/14/2023 Prepared by: Harlene Cordon  Exercises - Supine Quad Set  - 3-4 x daily - 7 x weekly - 1 sets - 10 reps - 5s hold - Active Straight Leg Raise with Quad Set  - 3-4 x daily - 7 x weekly - 2 sets - 10 reps - Sidelying Hip Abduction  - 3-4 x daily - 7 x weekly - 3 sets - 10 reps - Supine Heel Slide  - 3-4 x daily - 7 x weekly - 1 sets - 10 reps - 3 breaths hold   ASSESSMENT:  CLINICAL IMPRESSION: Pt is 7wks s/p Lt medial meniscus repair.  She tolerated partial revolutions on recumbent bicycle with encouragement. Improved gait quality with SPC when given tactile / VC for increased knee flexion during L swing through phase. Lt knee flexion remains limited.  Trial of rock tape to arch of Lt foot to address sensitivity at scar, affecting gait quality and pattern.   Therapy will continue to progress as tolerated. Therapist to check STG as time allows next visit.    Initial eval:  Patient is a 78 y.o. F who was seen today for physical therapy evaluation and treatment for s/p Lt medial meniscus repair.      REHAB  POTENTIAL: Good  CLINICAL DECISION MAKING: Stable/uncomplicated  EVALUATION COMPLEXITY: Low   GOALS: Goals reviewed with patient? Yes  SHORT TERM GOALS: Target date: 01/12/2024  Ambulation without AD, proper form Baseline: Goal status: in progress 01/27/24   2.  SLR without quad lag Baseline:  Goal status: achieved 11/18   3.  Proper control of TKE from mini squat Baseline:  Goal status: In Progress - 01/27/24   LONG TERM GOALS: Target date: POC date  Navigate stairs reciprocally without increased pain >2/10  Baseline:  Goal status: Begin stair training 11/21  2.  Proximal control through lunges without increase in pain (week 12) Baseline:  Goal status: INITIAL  3.  Single leg balance with head and UE motion Baseline:  Goal status: INITIAL  4.  Average pain with ADLs <=2/10 Baseline:  Goal status: INITIAL  5.  Quad strength, via hand held dyno testing, 85% of non-operative leg Baseline:  Goal status: INITIAL     PLAN:  PT FREQUENCY: 1-2x/week  PT DURATION: POC  date  PLANNED INTERVENTIONS: 97164- PT Re-evaluation, 97750- Physical Performance Testing, 97110-Therapeutic exercises, 97530- Therapeutic activity, [id number]- Neuromuscular re-education, [phone]- Self Care, 02859- Manual therapy, [phone]- Gait training, [phone]- Aquatic Therapy, [phone] (1-2 muscles), 20561 (3+ muscles)- Dry Needling, Patient/Family education, Balance training, Stair training, Taping, Joint mobilization, Spinal mobilization, Scar mobilization, and Cryotherapy.  PLAN FOR NEXT SESSION: progress gait training, functional strengthening, knee ROM  Delon Aquas, PTA 01/27/24 2:22 PM [facility] [address] [place], [place], [address] Phone: [phone]   Fax:  [phone]

## 2024-02-20 ENCOUNTER — Other Ambulatory Visit: Payer: Self-pay | Admitting: Emergency Medicine

## 2024-02-20 DIAGNOSIS — J449 Chronic obstructive pulmonary disease, unspecified: Secondary | ICD-10-CM

## 2024-02-23 ENCOUNTER — Ambulatory Visit (HOSPITAL_BASED_OUTPATIENT_CLINIC_OR_DEPARTMENT_OTHER): Attending: Orthopaedic Surgery | Admitting: Physical Therapy

## 2024-02-23 ENCOUNTER — Encounter (HOSPITAL_BASED_OUTPATIENT_CLINIC_OR_DEPARTMENT_OTHER): Payer: Self-pay | Admitting: Physical Therapy

## 2024-02-23 DIAGNOSIS — R262 Difficulty in walking, not elsewhere classified: Secondary | ICD-10-CM | POA: Insufficient documentation

## 2024-02-23 DIAGNOSIS — M6281 Muscle weakness (generalized): Secondary | ICD-10-CM | POA: Insufficient documentation

## 2024-02-23 DIAGNOSIS — G8929 Other chronic pain: Secondary | ICD-10-CM | POA: Diagnosis present

## 2024-02-23 DIAGNOSIS — M25562 Pain in left knee: Secondary | ICD-10-CM | POA: Diagnosis present

## 2024-02-23 NOTE — Therapy (Signed)
 " OUTPATIENT PHYSICAL THERAPY TREATMENT   Patient Name: Dana Daniels MRN: [id number] DOB:[birth year], 78 y.o., female Today's Date: 02/23/2024  END OF SESSION:  PT End of Session - 02/23/24 1311     Visit Number 9    Date for Recertification  02/27/24    PT Start Time 1314    PT Stop Time 1353    PT Time Calculation (min) 39 min    Activity Tolerance Patient tolerated treatment well    Behavior During Therapy WFL for tasks assessed/performed         Past Medical History:  Diagnosis Date   Allergy    Arthritis    Asthma    Blood transfusion without reported diagnosis    Cataract    Hyperlipidemia    Hypertension    Migraine    Osteoporosis    Sleep apnea    Past Surgical History:  Procedure Laterality Date   ABDOMINAL HYSTERECTOMY     CHOLECYSTECTOMY     EYE SURGERY Left    cataract    FOOT SURGERY Left    KNEE ARTHROSCOPY WITH MENISCAL REPAIR Left 12/07/2023   Procedure: ARTHROSCOPY, KNEE, WITH MENISCUS REPAIR;  Surgeon: Genelle Standing, MD;  Location: [facility];  Service: Orthopedics;  Laterality: Left;  LEFT KNEE ARTHROSCOPY WITH MENISCAL ROOT REPAIR   Patient Active Problem List   Diagnosis Date Noted   Acute medial meniscus tear of left knee 12/07/2023   Conductive hearing loss of right ear with unrestricted hearing of left ear 11/23/2023   Chronic insomnia 09/30/2022   Bilateral foot pain 08/05/2021   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   Seasonal allergies 03/31/2018   History of gastroesophageal reflux (GERD) 09/25/2017   Osteoarthritis of multiple joints 03/24/2017   Chronic pain of left knee 03/24/2017   Chronic obstructive pulmonary disease (HCC) 03/24/2017   Primary cancer of skin of left foot 02/03/2017   Mild intermittent asthma without complication 02/03/2017   Obstructive sleep apnea 02/03/2017   Dyslipidemia 02/03/2017   Migraine 02/03/2017   Essential hypertension 09/18/2008      REFERRING PROVIDER:  Genelle Standing, MD      REFERRING DIAG:  S83.242A (ICD-10-CM) - Acute medial meniscus tear of left knee, initial encounter    s/p PROCEDURE: 1. Left knee medial meniscal repair   Rationale for Evaluation and Treatment: Rehabilitation  THERAPY DIAG:  Difficulty in walking, not elsewhere classified  Muscle weakness (generalized)  Chronic pain of left knee  ONSET DATE: DOS 12/07/23   SUBJECTIVE:  SUBJECTIVE STATEMENT:  Pt reports she is doing well. She states she is able to complete her chores around house without difficulty. She reports she completes HEP and self massage, but knee remains tight.    Interpreter via [employer] present to interpret.   PERTINENT HISTORY:  OA  PAIN:  Are you having pain? no: NPRS scale: 0/10 Pain location: Lt knee Pain description: Aggravating factors:  Relieving factors: ice  PRECAUTIONS:  None  RED FLAGS: None   WEIGHT BEARING RESTRICTIONS:  WBAT  FALLS:  Has patient fallen in last 6 months? No   OCCUPATION:  Not working  PLOF:  Independent  PATIENT GOALS:  Walk without AD, navigate stairs     OBJECTIVE:  Note: Objective measures were completed at Evaluation unless otherwise noted.   PATIENT SURVEYS:  LEFS  Extreme difficulty/unable (0), Quite a bit of difficulty (1), Moderate difficulty (2), Little difficulty (3), No difficulty (4) Survey date:    Any of your usual work, housework or school activities   2. Usual hobbies, recreational or sporting activities   3. Getting into/out of the bath 3  4. Walking between rooms 2  5. Putting on socks/shoes   6. Squatting    7. Lifting an object, like a bag of groceries from the floor   8. Performing light activities around your home   9. Performing heavy  activities around your home   10. Getting into/out of a car 2  11. Walking 2 blocks   12. Walking 1 mile   13. Going up/down 10 stairs (1 flight)   14. Standing for 1 hour   15.  sitting for 1 hour 2  16. Running on even ground   17. Running on uneven ground   18. Making sharp turns while running fast   19. Hopping    20. Rolling over in bed 2  Score total:  11     COGNITIVE STATUS: Within functional limits for tasks assessed   SENSATION: WFL  EDEMA:  Yes: mild as expected post op   GAIT: Comments: EVAL  arrived with rollator and brace locked in extension   Body Part #1 Knee  PALPATION: EVAL: no s/s of infection, good patellar mobility  LOWER EXTREMITY ROM:     Passive  Left eval Left 12/24/23 AAROM Left  Left  11/14 Left  12/23   Knee flexion 50 63 80 98 98  Knee extension 0 0     Ankle dorsiflexion       Ankle plantarflexion       Ankle inversion       Ankle eversion        (Blank rows = not tested)  Strength:     Knee extension LLE: 43.1#    RLE: 44.7#   Flexion  LLE 26.0#   RLE 27.7#,   TREATMENT DATE:  02/23/24  -NuStep L4, LE only x 6 min for dynamic warm up. Therapist present to discuss progress  - prone quad stretch with strap on foot, 30 sec x 3 (knee flexion to 80 degrees) - supine Lt heel slide AAROM with strap, to 98 degrees - MMT (see above) - SLS-  Lt 3 sec, 5 sec without UE support, RLE x 5 sec - reciprocal pattern on stairs forward x 5 steps, 2 reps; unable to complete reciprocal forward, can complete moving backwards or step-to sideways - alternating backward Lunges UE on counter x 6 each side    01/27/24 - partial revolutions on scifit bike x 6  min - Lt forward step up/Rt retro step down 4 x 10 with light UE on counter - Lt lateral 2 step up x 10 - heel/toe raises at counter x 10  - Lt calf stretch at counter x 10-15sec x 3 (cues to slow counting) - Lt forward step up, Rt lateral step down - on airex pad with light UE  support on counter x 10 - STS in semi staggered stance (left foot back) x 10 without UE - gait: short trials with SPC. Cues for increased Lt knee flexion during swing through - scar mobilization on Lt knee; pt and her daughter instructed on technique - tape application to Lt arch ( over old incision) for scar mobilization/desensitization    11/21 Manual: STM to quad Edema massage Extension stretch with distraction   There-ex:  Quad set x12 SLR 3x10 no extensor lag  Nu-step 5 min  SAQ 3x10   There-act:  Step up 2x10 2 inch  Lateral step up 2x10 2 inch    11/18 Manual: STM to quad Edema massage Extension stretch with distraction    There-ex:  Quad set x12 SLR 3x10 no extensor lag  Nu-step 5 min   There-ex:  Quad set x12 SLR 3x10 no extensor lag  Reviewed and updated HEP  Nu-step 5 min  Seated hip abudction red 3x10   Neuro re-ed   11/14 Manual: STM to quad Edema massage Extension stretch with distraction    There-ex:  Quad set x12 SLR 3x10 no extensor lag  Reviewed and updated HEP  Nu-step 5 min  SAQ x12   Gait: reviewed use of cane. See assessment. SBA with belt 300' with using to keep hand in the right hand.      PATIENT EDUCATION:  Education details: HEP, exercise form/rationale Person educated: Patient  Education method: Explanation, Demonstration, Tactile cues, Verbal cues Education comprehension: verbalized understanding, returned demonstration, verbal cues required, tactile cues required, and needs further education   HOME EXERCISE PROGRAM: Access Code: [email or web address] URL: [email or web address] Date: 12/14/2023 Prepared by: Harlene Cordon  Exercises - Supine Quad Set  - 3-4 x daily - 7 x weekly - 1 sets - 10 reps - 5s hold - Active Straight Leg Raise with Quad Set  - 3-4 x daily - 7 x weekly - 2 sets - 10 reps - Sidelying Hip Abduction  - 3-4 x daily - 7 x weekly - 3 sets - 10 reps - Supine Heel Slide  - 3-4 x daily - 7  x weekly - 1 sets - 10 reps - 3 breaths hold   ASSESSMENT:  CLINICAL IMPRESSION: Pt is 11 wks s/p Lt medial meniscus repair. Lt knee ROM remains limited to 98 of flexion.  Strength is near equal to RLE with MMT.   She is able to complete reciprocal pattern ascending stairs, but is not able to do with descending without completing it while moving backwards. Pt reported minimal increase in pain during session.   Pt has partially met her goals.  Will await advisement from MD after follow up appt, as pt verbalized uncertainty of continuation of PT at this time.   Initial eval:  Patient is a 78 y.o. F who was seen today for physical therapy evaluation and treatment for s/p Lt medial meniscus repair.      REHAB POTENTIAL: Good  CLINICAL DECISION MAKING: Stable/uncomplicated  EVALUATION COMPLEXITY: Low   GOALS: Goals reviewed with patient? Yes  SHORT TERM GOALS: Target date: 01/12/2024  Ambulation without AD, proper  form Baseline: Goal status:partially met - 02/23/24; can ambulate without AD, with antalgic gait   2.  SLR without quad lag Baseline:  Goal status: achieved 11/18   3.  Proper control of TKE from mini squat Baseline:  Goal status: MET - 12/23/2   LONG TERM GOALS: Target date: POC date  Navigate stairs reciprocally without increased pain >2/10  Baseline:  Goal status:Partially met - 02/23/24, able to go up reciprocal, but needs to do step-to for descending  2.  Proximal control through lunges without increase in pain (week 12) Baseline:  Goal status: In progress - 02/23/24, has mild increase in pain and tightness in Lt knee  3.  Single leg balance with head and UE motion Baseline: up to 3 sec LLE Goal status: In progress -02/23/24  4.  Average pain with ADLs <=2/10 Baseline:  Goal status: In progress - 02/23/24  5.  Quad strength, via hand held dyno testing, 85% of non-operative leg Baseline: see chart Goal status: MET- 02/23/24     PLAN:  PT  FREQUENCY: 1-2x/week  PT DURATION: POC date  PLANNED INTERVENTIONS: 97164- PT Re-evaluation, 97750- Physical Performance Testing, 97110-Therapeutic exercises, 97530- Therapeutic activity, 97112- Neuromuscular re-education, 97535- Self Care, 02859- Manual therapy, [phone]- Gait training, [phone]- Aquatic Therapy, [phone] (1-2 muscles), 20561 (3+ muscles)- Dry Needling, Patient/Family education, Balance training, Stair training, Taping, Joint mobilization, Spinal mobilization, Scar mobilization, and Cryotherapy.  PLAN FOR NEXT SESSION: 10th visit progress note  Delon Aquas, PTA 02/23/2024 3:05 PM [facility] [address] [place], [place], [address] Phone: [phone]   Fax:  [phone]  "

## 2024-02-24 ENCOUNTER — Ambulatory Visit

## 2024-03-01 ENCOUNTER — Ambulatory Visit (HOSPITAL_BASED_OUTPATIENT_CLINIC_OR_DEPARTMENT_OTHER): Admitting: Physical Therapy

## 2024-03-01 NOTE — Therapy (Deleted)
 " OUTPATIENT PHYSICAL THERAPY TREATMENT   Patient Name: Dana Daniels MRN: [id number] DOB:10/20/1945, 78 y.o., female Today's Date: 03/01/2024  END OF SESSION:   Past Medical History:  Diagnosis Date   Allergy    Arthritis    Asthma    Blood transfusion without reported diagnosis    Cataract    Hyperlipidemia    Hypertension    Migraine    Osteoporosis    Sleep apnea    Past Surgical History:  Procedure Laterality Date   ABDOMINAL HYSTERECTOMY     CHOLECYSTECTOMY     EYE SURGERY Left    cataract    FOOT SURGERY Left    KNEE ARTHROSCOPY WITH MENISCAL REPAIR Left 12/07/2023   Procedure: ARTHROSCOPY, KNEE, WITH MENISCUS REPAIR;  Surgeon: Genelle Standing, MD;  Location: [facility];  Service: Orthopedics;  Laterality: Left;  LEFT KNEE ARTHROSCOPY WITH MENISCAL ROOT REPAIR   Patient Active Problem List   Diagnosis Date Noted   Acute medial meniscus tear of left knee 12/07/2023   Conductive hearing loss of right ear with unrestricted hearing of left ear 11/23/2023   Chronic insomnia 09/30/2022   Bilateral foot pain 08/05/2021   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   Seasonal allergies 03/31/2018   History of gastroesophageal reflux (GERD) 09/25/2017   Osteoarthritis of multiple joints 03/24/2017   Chronic pain of left knee 03/24/2017   Chronic obstructive pulmonary disease (HCC) 03/24/2017   Primary cancer of skin of left foot 02/03/2017   Mild intermittent asthma without complication 02/03/2017   Obstructive sleep apnea 02/03/2017   Dyslipidemia 02/03/2017   Migraine 02/03/2017   Essential hypertension 09/18/2008     REFERRING PROVIDER:  Genelle Standing, MD      REFERRING DIAG:  S83.242A (ICD-10-CM) - Acute medial meniscus tear of left knee, initial encounter    s/p PROCEDURE: 1. Left knee medial meniscal repair   Rationale for Evaluation and Treatment: Rehabilitation  THERAPY DIAG:  No diagnosis  found.  ONSET DATE: DOS 12/07/23   SUBJECTIVE:                                                                                                                                                                                           SUBJECTIVE STATEMENT:  Pt reports she is doing well. She states she is able to complete her chores around house without difficulty. She reports she completes HEP and self massage, but knee remains tight.    Interpreter via [employer] present to interpret.   PERTINENT HISTORY:  OA  PAIN:  Are you having pain? no: NPRS scale: 0/10  Pain location: Lt knee Pain description: Aggravating factors:  Relieving factors: ice  PRECAUTIONS:  None  RED FLAGS: None   WEIGHT BEARING RESTRICTIONS:  WBAT  FALLS:  Has patient fallen in last 6 months? No   OCCUPATION:  Not working  PLOF:  Independent  PATIENT GOALS:  Walk without AD, navigate stairs     OBJECTIVE:  Note: Objective measures were completed at Evaluation unless otherwise noted.   PATIENT SURVEYS:  LEFS  Extreme difficulty/unable (0), Quite a bit of difficulty (1), Moderate difficulty (2), Little difficulty (3), No difficulty (4) Survey date:    Any of your usual work, housework or school activities   2. Usual hobbies, recreational or sporting activities   3. Getting into/out of the bath 3  4. Walking between rooms 2  5. Putting on socks/shoes   6. Squatting    7. Lifting an object, like a bag of groceries from the floor   8. Performing light activities around your home   9. Performing heavy activities around your home   10. Getting into/out of a car 2  11. Walking 2 blocks   12. Walking 1 mile   13. Going up/down 10 stairs (1 flight)   14. Standing for 1 hour   15.  sitting for 1 hour 2  16. Running on even ground   17. Running on uneven ground   18. Making sharp turns while running fast   19. Hopping    20. Rolling over in bed 2  Score total:  11     COGNITIVE  STATUS: Within functional limits for tasks assessed   SENSATION: WFL  EDEMA:  Yes: mild as expected post op   GAIT: Comments: EVAL  arrived with rollator and brace locked in extension   Body Part #1 Knee  PALPATION: EVAL: no s/s of infection, good patellar mobility  LOWER EXTREMITY ROM:     Passive  Left eval Left 12/24/23 AAROM Left  Left  11/14 Left  12/23   Knee flexion 50 63 80 98 98  Knee extension 0 0     Ankle dorsiflexion       Ankle plantarflexion       Ankle inversion       Ankle eversion        (Blank rows = not tested)  Strength:     Knee extension LLE: 43.1#    RLE: 44.7#   Flexion  LLE 26.0#   RLE 27.7#,   TREATMENT DATE:  02/23/24  -NuStep L4, LE only x 6 min for dynamic warm up. Therapist present to discuss progress  - prone quad stretch with strap on foot, 30 sec x 3 (knee flexion to 80 degrees) - supine Lt heel slide AAROM with strap, to 98 degrees - MMT (see above) - SLS-  Lt 3 sec, 5 sec without UE support, RLE x 5 sec - reciprocal pattern on stairs forward x 5 steps, 2 reps; unable to complete reciprocal forward, can complete moving backwards or step-to sideways - alternating backward Lunges UE on counter x 6 each side    01/27/24 - partial revolutions on scifit bike x 6 min - Lt forward step up/Rt retro step down 4 x 10 with light UE on counter - Lt lateral 2 step up x 10 - heel/toe raises at counter x 10  - Lt calf stretch at counter x 10-15sec x 3 (cues to slow counting) - Lt forward step up, Rt lateral step down - on airex pad  with light UE support on counter x 10 - STS in semi staggered stance (left foot back) x 10 without UE - gait: short trials with SPC. Cues for increased Lt knee flexion during swing through - scar mobilization on Lt knee; pt and her daughter instructed on technique - tape application to Lt arch ( over old incision) for scar mobilization/desensitization    11/21 Manual: STM to quad Edema  massage Extension stretch with distraction   There-ex:  Quad set x12 SLR 3x10 no extensor lag  Nu-step 5 min  SAQ 3x10   There-act:  Step up 2x10 2 inch  Lateral step up 2x10 2 inch    11/18 Manual: STM to quad Edema massage Extension stretch with distraction    There-ex:  Quad set x12 SLR 3x10 no extensor lag  Nu-step 5 min   There-ex:  Quad set x12 SLR 3x10 no extensor lag  Reviewed and updated HEP  Nu-step 5 min  Seated hip abudction red 3x10   Neuro re-ed   11/14 Manual: STM to quad Edema massage Extension stretch with distraction    There-ex:  Quad set x12 SLR 3x10 no extensor lag  Reviewed and updated HEP  Nu-step 5 min  SAQ x12   Gait: reviewed use of cane. See assessment. SBA with belt 300' with using to keep hand in the right hand.      PATIENT EDUCATION:  Education details: HEP, exercise form/rationale Person educated: Patient  Education method: Explanation, Demonstration, Tactile cues, Verbal cues Education comprehension: verbalized understanding, returned demonstration, verbal cues required, tactile cues required, and needs further education   HOME EXERCISE PROGRAM: Access Code: [email or web address] URL: [email or web address] Date: 12/14/2023 Prepared by: Harlene Cordon  Exercises - Supine Quad Set  - 3-4 x daily - 7 x weekly - 1 sets - 10 reps - 5s hold - Active Straight Leg Raise with Quad Set  - 3-4 x daily - 7 x weekly - 2 sets - 10 reps - Sidelying Hip Abduction  - 3-4 x daily - 7 x weekly - 3 sets - 10 reps - Supine Heel Slide  - 3-4 x daily - 7 x weekly - 1 sets - 10 reps - 3 breaths hold   ASSESSMENT:  CLINICAL IMPRESSION: Pt is 11 wks s/p Lt medial meniscus repair. Lt knee ROM remains limited to 98 of flexion.  Strength is near equal to RLE with MMT.   She is able to complete reciprocal pattern ascending stairs, but is not able to do with descending without completing it while moving backwards. Pt reported minimal  increase in pain during session.   Pt has partially met her goals.  Will await advisement from MD after follow up appt, as pt verbalized uncertainty of continuation of PT at this time.   Initial eval:  Patient is a 78 y.o. F who was seen today for physical therapy evaluation and treatment for s/p Lt medial meniscus repair.      REHAB POTENTIAL: Good  CLINICAL DECISION MAKING: Stable/uncomplicated  EVALUATION COMPLEXITY: Low   GOALS: Goals reviewed with patient? Yes  SHORT TERM GOALS: Target date: 01/12/2024  Ambulation without AD, proper form Baseline: Goal status:partially met - 02/23/24; can ambulate without AD, with antalgic gait   2.  SLR without quad lag Baseline:  Goal status: achieved 11/18   3.  Proper control of TKE from mini squat Baseline:  Goal status: MET - 12/23/2   LONG TERM GOALS: Target date: POC date  Navigate stairs reciprocally without  increased pain >2/10  Baseline:  Goal status:Partially met - 02/23/24, able to go up reciprocal, but needs to do step-to for descending  2.  Proximal control through lunges without increase in pain (week 12) Baseline:  Goal status: In progress - 02/23/24, has mild increase in pain and tightness in Lt knee  3.  Single leg balance with head and UE motion Baseline: up to 3 sec LLE Goal status: In progress -02/23/24  4.  Average pain with ADLs <=2/10 Baseline:  Goal status: In progress - 02/23/24  5.  Quad strength, via hand held dyno testing, 85% of non-operative leg Baseline: see chart Goal status: MET- 02/23/24     PLAN:  PT FREQUENCY: 1-2x/week  PT DURATION: POC date  PLANNED INTERVENTIONS: 97164- PT Re-evaluation, 97750- Physical Performance Testing, 97110-Therapeutic exercises, 97530- Therapeutic activity, 97112- Neuromuscular re-education, 97535- Self Care, 02859- Manual therapy, [phone]- Gait training, [phone]- Aquatic Therapy, [phone] (1-2 muscles), 20561 (3+ muscles)- Dry Needling, Patient/Family  education, Balance training, Stair training, Taping, Joint mobilization, Spinal mobilization, Scar mobilization, and Cryotherapy.  PLAN FOR NEXT SESSION: 10th visit progress note  Rojean Batten PT, DPT 03/01/2024  8:43 AM    "

## 2024-03-07 ENCOUNTER — Telehealth: Payer: Self-pay

## 2024-03-07 ENCOUNTER — Other Ambulatory Visit (HOSPITAL_COMMUNITY): Payer: Self-pay

## 2024-03-07 NOTE — Telephone Encounter (Signed)
 Pharmacy Patient Advocate Encounter   Received notification from [employer]base CMM KEY that prior authorization for Ubrogepant  (UBRELVY ) 100 MG TABS  is required/requested.   Insurance verification completed.   The patient is insured through [facility].   Per test claim: PA required; PA submitted to above mentioned insurance via Latent Key/confirmation #/EOC [id number] Status is pending

## 2024-03-08 ENCOUNTER — Other Ambulatory Visit (HOSPITAL_COMMUNITY): Payer: Self-pay

## 2024-03-08 NOTE — Telephone Encounter (Signed)
 Pharmacy Patient Advocate Encounter  Received notification from [employer] that Prior Authorization for Ubrelvy  100MG  tablets  has been APPROVED from 03/03/2024 to 03/02/2025. Unable to obtain price due to refill too soon rejection, last fill date 02/27/2024 next available fill date01/08/2024   PA #/Case ID/Reference #: [id number] This medication or product was previously approved on [id number] from 2024-03-03 to 2025-03-02. **Please note: This request was submitted electronically. Formulary lowering, tiering exception, cost reduction and/or pre-benefit determination review (including prospective Medicare hospice reviews) requests cannot be requested using this method of submission.

## 2024-03-09 ENCOUNTER — Ambulatory Visit (HOSPITAL_BASED_OUTPATIENT_CLINIC_OR_DEPARTMENT_OTHER): Admitting: Orthopaedic Surgery

## 2024-03-09 DIAGNOSIS — S83242A Other tear of medial meniscus, current injury, left knee, initial encounter: Secondary | ICD-10-CM

## 2024-03-09 NOTE — Progress Notes (Signed)
 "                                        Post Operative Evaluation      Procedure/Date of Surgery: Left knee medial meniscus repair with centralization 12/07/2023   Interval History:    Patient presents today 12 weeks status post above procedure.  Overall she is doing very well and continuing to improve     PMH/PSH/Family History/Social History/Meds/Allergies:         Past Medical History:  Diagnosis Date   Allergy     Arthritis     Asthma     Blood transfusion without reported diagnosis     Cataract     Hyperlipidemia     Hypertension     Migraine     Osteoporosis     Sleep apnea               Past Surgical History:  Procedure Laterality Date   ABDOMINAL HYSTERECTOMY       CHOLECYSTECTOMY       EYE SURGERY Left      cataract    FOOT SURGERY Left     KNEE ARTHROSCOPY WITH MENISCAL REPAIR Left 12/07/2023    Procedure: ARTHROSCOPY, KNEE, WITH MENISCUS REPAIR;  Surgeon: Genelle Standing, MD;  Location: [facility];  Service: Orthopedics;  Laterality: Left;  LEFT KNEE ARTHROSCOPY WITH MENISCAL ROOT REPAIR        Social History         Socioeconomic History   Marital status: Widowed      Spouse name: Not on file   Number of children: Not on file   Years of education: Not on file   Highest education level: Associate degree: occupational, scientist, product/process development, or vocational program  Occupational History   Not on file  Tobacco Use   Smoking status: Never   Smokeless tobacco: Never  Vaping Use   Vaping status: Never Used  Substance and Sexual Activity   Alcohol use: No   Drug use: Never   Sexual activity: Not on file  Other Topics Concern   Not on file  Social History Narrative   Not on file    Social Drivers of Health        Financial Resource Strain: Patient Declined (05/26/2023)    Overall Financial Resource Strain (CARDIA)     Difficulty of Paying Living Expenses: Patient declined  Food Insecurity: Patient Declined (05/26/2023)    Hunger Vital  Sign     Worried About Running Out of Food in the Last Year: Patient declined     Ran Out of Food in the Last Year: Patient declined  Transportation Needs: Patient Declined (05/26/2023)    PRAPARE - Therapist, Art (Medical): Patient declined     Lack of Transportation (Non-Medical): Patient declined  Physical Activity: Insufficiently Active (05/26/2023)    Exercise Vital Sign     Days of Exercise per Week: 3 days     Minutes of Exercise per Session: 20 min  Stress: No Stress Concern Present (05/26/2023)    [employer] of Occupational Health - Occupational Stress Questionnaire     Feeling of Stress : Not at all  Social Connections: Unknown (05/26/2023)    Social Connection and Isolation Panel     Frequency of Communication with Friends and Family: More than three times a week  Frequency of Social Gatherings with Friends and Family: Twice a week     Attends Religious Services: Patient declined     Active Member of Clubs or Organizations: Patient declined     Attends Banker Meetings: Patient declined     Marital Status: Widowed         Family History  Problem Relation Age of Onset   Heart disease Mother     Hyperlipidemia Mother     Hypertension Mother     Stroke Mother     Heart disease Father     Stroke Father     Diabetes Daughter     Hypertension Daughter     Hypertension Son     Hyperlipidemia Son     Fibromyalgia Son     Lupus Son     Sleep apnea Son     Hypertension Son     Sleep apnea Son          Allergies      Allergies  Allergen Reactions   Tramadol Itching            Current Outpatient Medications  Medication Sig Dispense Refill   acetaminophen  (TYLENOL ) 500 MG tablet Take 1 tablet (500 mg total) by mouth every 8 (eight) hours for 10 days. 30 tablet 0   albuterol  (VENTOLIN  HFA) 108 (90 Base) MCG/ACT inhaler INHALE 1 TO 2 PUFFS BY MOUTH EVERY 4 HOURS AS NEEDED FOR WHEEZING FOR SHORTNESS OF BREATH 9 g 0    aspirin  EC 325 MG tablet Take 1 tablet (325 mg total) by mouth daily. 14 tablet 0   azithromycin  (ZITHROMAX ) 250 MG tablet Sig as indicated 6 tablet 0   benzonatate  (TESSALON ) 200 MG capsule Take 1 capsule (200 mg total) by mouth 2 (two) times daily as needed for cough. 20 capsule 0   cetirizine  (ZYRTEC ) 10 MG tablet Take 1 tablet (10 mg total) by mouth daily. 10 tablet 11   cromolyn  (NASALCROM ) 5.2 MG/ACT nasal spray Place 1 spray into both nostrils 2 (two) times daily. 26 mL 12   diltiazem  (TIAZAC ) 240 MG 24 hr capsule Take 1 capsule by mouth once daily 90 capsule 0   Fluticasone -Umeclidin-Vilant (TRELEGY ELLIPTA ) 100-62.5-25 MCG/ACT AEPB Inhale 1 puff by mouth once daily 60 each 3   HYDROcodone  bit-homatropine (HYCODAN) 5-1.5 MG/5ML syrup Take 5 mLs by mouth at bedtime as needed for cough. 120 mL 0   montelukast  (SINGULAIR ) 10 MG tablet Take 1 tablet (10 mg total) by mouth at bedtime. 90 tablet 3   nitroGLYCERIN  (NITROSTAT ) 0.4 MG SL tablet Place 1 tablet (0.4 mg total) under the tongue every 5 (five) minutes as needed for chest pain. 25 tablet 0   omeprazole  (PRILOSEC) 40 MG capsule Take 1 capsule (40 mg total) by mouth daily. 90 capsule 3   oxyCODONE  (ROXICODONE ) 5 MG immediate release tablet Take 1 tablet (5 mg total) by mouth every 4 (four) hours as needed for severe pain (pain score 7-10) or breakthrough pain. 10 tablet 0   simvastatin  (ZOCOR ) 20 MG tablet Take 1 tablet (20 mg total) by mouth daily. 90 tablet 3   Ubrogepant  (UBRELVY ) 100 MG TABS Take 1 tablet (100 mg total) by mouth daily as needed. Take at onset of migraine headache 15 tablet 3   zolpidem  (AMBIEN ) 5 MG tablet Take 1 tablet (5 mg total) by mouth at bedtime as needed for sleep. 15 tablet 1      No current facility-administered medications for this visit.  Imaging Results (Last 48 hours)  No results found.     Review of Systems:   A ROS was performed including pertinent positives and negatives as documented in the  HPI.     Musculoskeletal Exam:     There were no vitals taken for this visit.   Left knee incisions are well-appearing without erythema or drainage.  Mild residual swelling within the knee without ecchymosis.  Knee flexion to 60 degrees today.  Patient is ambulating well with assistance of a walker.   Imaging:       Assessment:   Patient is 12 weeks status post left knee medial meniscal repair with centralization overall doing very well.  At this time she is continuing to improve nicely plan to see her back as needed   Plan :     - Return to clinic as needed           I personally saw and evaluated the patient, and participated in the management and treatment plan. "

## 2024-03-10 ENCOUNTER — Ambulatory Visit (HOSPITAL_BASED_OUTPATIENT_CLINIC_OR_DEPARTMENT_OTHER): Attending: Orthopaedic Surgery | Admitting: Physical Therapy

## 2024-03-15 ENCOUNTER — Other Ambulatory Visit: Payer: Self-pay | Admitting: Emergency Medicine

## 2024-03-15 DIAGNOSIS — J449 Chronic obstructive pulmonary disease, unspecified: Secondary | ICD-10-CM

## 2024-03-23 ENCOUNTER — Encounter (HOSPITAL_BASED_OUTPATIENT_CLINIC_OR_DEPARTMENT_OTHER): Admitting: Physical Therapy

## 2024-03-31 ENCOUNTER — Encounter (HOSPITAL_BASED_OUTPATIENT_CLINIC_OR_DEPARTMENT_OTHER): Admitting: Physical Therapy

## 2024-04-07 ENCOUNTER — Other Ambulatory Visit: Payer: Self-pay | Admitting: Emergency Medicine

## 2024-04-07 DIAGNOSIS — J449 Chronic obstructive pulmonary disease, unspecified: Secondary | ICD-10-CM

## 2024-04-15 ENCOUNTER — Ambulatory Visit

## 2024-05-23 ENCOUNTER — Ambulatory Visit: Admitting: Emergency Medicine
